# Patient Record
Sex: Male | Born: 1950 | Race: Black or African American | Hispanic: No | Marital: Single | Smoking: Former smoker
Health system: Southern US, Community
[De-identification: ages and names within clinical notes are randomized; demographics above are authoritative.]

## PROBLEM LIST (undated history)

## (undated) DIAGNOSIS — M8700 Idiopathic aseptic necrosis of unspecified bone: Secondary | ICD-10-CM

## (undated) DIAGNOSIS — D649 Anemia, unspecified: Secondary | ICD-10-CM

## (undated) DIAGNOSIS — Z720 Tobacco use: Secondary | ICD-10-CM

## (undated) DIAGNOSIS — I2510 Atherosclerotic heart disease of native coronary artery without angina pectoris: Secondary | ICD-10-CM

## (undated) DIAGNOSIS — E785 Hyperlipidemia, unspecified: Secondary | ICD-10-CM

## (undated) DIAGNOSIS — R413 Other amnesia: Secondary | ICD-10-CM

## (undated) DIAGNOSIS — J302 Other seasonal allergic rhinitis: Secondary | ICD-10-CM

## (undated) DIAGNOSIS — I219 Acute myocardial infarction, unspecified: Secondary | ICD-10-CM

## (undated) DIAGNOSIS — H409 Unspecified glaucoma: Secondary | ICD-10-CM

## (undated) DIAGNOSIS — H8110 Benign paroxysmal vertigo, unspecified ear: Secondary | ICD-10-CM

## (undated) DIAGNOSIS — K219 Gastro-esophageal reflux disease without esophagitis: Secondary | ICD-10-CM

## (undated) DIAGNOSIS — M1612 Unilateral primary osteoarthritis, left hip: Secondary | ICD-10-CM

## (undated) DIAGNOSIS — L237 Allergic contact dermatitis due to plants, except food: Secondary | ICD-10-CM

## (undated) DIAGNOSIS — Z8701 Personal history of pneumonia (recurrent): Secondary | ICD-10-CM

## (undated) DIAGNOSIS — I10 Essential (primary) hypertension: Secondary | ICD-10-CM

## (undated) DIAGNOSIS — J45909 Unspecified asthma, uncomplicated: Secondary | ICD-10-CM

## (undated) DIAGNOSIS — M810 Age-related osteoporosis without current pathological fracture: Secondary | ICD-10-CM

## (undated) HISTORY — PX: HAND NERVE REPAIR: SHX1728

## (undated) HISTORY — DX: Hyperlipidemia, unspecified: E78.5

## (undated) HISTORY — DX: Age-related osteoporosis without current pathological fracture: M81.0

## (undated) HISTORY — DX: Personal history of pneumonia (recurrent): Z87.01

## (undated) HISTORY — DX: Acute myocardial infarction, unspecified: I21.9

## (undated) HISTORY — DX: Anemia, unspecified: D64.9

## (undated) HISTORY — DX: Benign paroxysmal vertigo, unspecified ear: H81.10

## (undated) HISTORY — DX: Other amnesia: R41.3

## (undated) HISTORY — DX: Unspecified glaucoma: H40.9

## (undated) HISTORY — PX: CARDIAC CATHETERIZATION: SHX172

## (undated) HISTORY — DX: Idiopathic aseptic necrosis of unspecified bone: M87.00

## (undated) HISTORY — DX: Tobacco use: Z72.0

## (undated) HISTORY — DX: Essential (primary) hypertension: I10

## (undated) HISTORY — PX: TOTAL HIP ARTHROPLASTY: SHX124

## (undated) HISTORY — PX: JOINT REPLACEMENT: SHX530

---
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
## 1994-10-08 ENCOUNTER — Encounter (INDEPENDENT_AMBULATORY_CARE_PROVIDER_SITE_OTHER): Payer: Self-pay | Admitting: *Deleted

## 1994-10-08 LAB — CONVERTED CEMR LAB: CD4 T Cell Abs: 70

## 1997-08-18 ENCOUNTER — Encounter: Admission: RE | Admit: 1997-08-18 | Discharge: 1997-08-18 | Payer: Self-pay | Admitting: Infectious Diseases

## 1997-10-14 ENCOUNTER — Encounter: Admission: RE | Admit: 1997-10-14 | Discharge: 1997-10-14 | Payer: Self-pay | Admitting: Infectious Diseases

## 1997-10-16 ENCOUNTER — Encounter: Admission: RE | Admit: 1997-10-16 | Discharge: 1997-10-16 | Payer: Self-pay | Admitting: *Deleted

## 1997-11-26 ENCOUNTER — Encounter: Admission: RE | Admit: 1997-11-26 | Discharge: 1997-11-26 | Payer: Self-pay | Admitting: Infectious Diseases

## 1998-01-05 ENCOUNTER — Encounter: Admission: RE | Admit: 1998-01-05 | Discharge: 1998-01-05 | Payer: Self-pay | Admitting: Infectious Diseases

## 1998-03-11 ENCOUNTER — Encounter: Admission: RE | Admit: 1998-03-11 | Discharge: 1998-03-11 | Payer: Self-pay | Admitting: Infectious Diseases

## 1998-03-11 ENCOUNTER — Ambulatory Visit (HOSPITAL_COMMUNITY): Admission: RE | Admit: 1998-03-11 | Discharge: 1998-03-11 | Payer: Self-pay | Admitting: Infectious Diseases

## 1998-04-22 ENCOUNTER — Encounter: Admission: RE | Admit: 1998-04-22 | Discharge: 1998-04-22 | Payer: Self-pay | Admitting: Infectious Diseases

## 1998-06-24 ENCOUNTER — Ambulatory Visit (HOSPITAL_COMMUNITY): Admission: RE | Admit: 1998-06-24 | Discharge: 1998-06-24 | Payer: Self-pay | Admitting: Infectious Diseases

## 1998-08-05 ENCOUNTER — Encounter: Admission: RE | Admit: 1998-08-05 | Discharge: 1998-08-05 | Payer: Self-pay | Admitting: Infectious Diseases

## 1998-08-24 ENCOUNTER — Ambulatory Visit (HOSPITAL_COMMUNITY): Admission: RE | Admit: 1998-08-24 | Discharge: 1998-08-24 | Payer: Self-pay | Admitting: Infectious Diseases

## 1998-08-24 ENCOUNTER — Encounter: Admission: RE | Admit: 1998-08-24 | Discharge: 1998-08-24 | Payer: Self-pay | Admitting: Infectious Diseases

## 1998-09-09 ENCOUNTER — Encounter: Admission: RE | Admit: 1998-09-09 | Discharge: 1998-09-09 | Payer: Self-pay | Admitting: Infectious Diseases

## 1998-11-11 ENCOUNTER — Ambulatory Visit (HOSPITAL_COMMUNITY): Admission: RE | Admit: 1998-11-11 | Discharge: 1998-11-11 | Payer: Self-pay | Admitting: Infectious Diseases

## 1998-11-18 ENCOUNTER — Encounter: Admission: RE | Admit: 1998-11-18 | Discharge: 1998-11-18 | Payer: Self-pay | Admitting: Internal Medicine

## 1998-11-23 ENCOUNTER — Ambulatory Visit (HOSPITAL_COMMUNITY): Admission: RE | Admit: 1998-11-23 | Discharge: 1998-11-23 | Payer: Self-pay | Admitting: Infectious Diseases

## 1998-11-23 ENCOUNTER — Encounter: Payer: Self-pay | Admitting: Infectious Diseases

## 1998-11-26 ENCOUNTER — Encounter: Payer: Self-pay | Admitting: Infectious Diseases

## 1998-11-26 ENCOUNTER — Ambulatory Visit (HOSPITAL_COMMUNITY): Admission: RE | Admit: 1998-11-26 | Discharge: 1998-11-26 | Payer: Self-pay | Admitting: Infectious Diseases

## 1998-12-09 ENCOUNTER — Encounter: Admission: RE | Admit: 1998-12-09 | Discharge: 1998-12-09 | Payer: Self-pay | Admitting: Infectious Diseases

## 1999-03-31 ENCOUNTER — Encounter: Admission: RE | Admit: 1999-03-31 | Discharge: 1999-03-31 | Payer: Self-pay | Admitting: Infectious Diseases

## 1999-04-05 ENCOUNTER — Ambulatory Visit (HOSPITAL_COMMUNITY): Admission: RE | Admit: 1999-04-05 | Discharge: 1999-04-05 | Payer: Self-pay | Admitting: Infectious Diseases

## 1999-04-05 ENCOUNTER — Encounter: Admission: RE | Admit: 1999-04-05 | Discharge: 1999-04-05 | Payer: Self-pay | Admitting: Infectious Diseases

## 1999-04-12 ENCOUNTER — Encounter: Admission: RE | Admit: 1999-04-12 | Discharge: 1999-04-12 | Payer: Self-pay | Admitting: Infectious Diseases

## 1999-04-19 ENCOUNTER — Emergency Department (HOSPITAL_COMMUNITY): Admission: EM | Admit: 1999-04-19 | Discharge: 1999-04-19 | Payer: Self-pay | Admitting: Emergency Medicine

## 1999-04-20 ENCOUNTER — Encounter: Payer: Self-pay | Admitting: Emergency Medicine

## 1999-06-02 ENCOUNTER — Ambulatory Visit (HOSPITAL_COMMUNITY): Admission: RE | Admit: 1999-06-02 | Discharge: 1999-06-02 | Payer: Self-pay | Admitting: Infectious Diseases

## 1999-06-02 ENCOUNTER — Encounter: Admission: RE | Admit: 1999-06-02 | Discharge: 1999-06-02 | Payer: Self-pay | Admitting: Infectious Diseases

## 1999-06-16 ENCOUNTER — Encounter: Admission: RE | Admit: 1999-06-16 | Discharge: 1999-06-16 | Payer: Self-pay | Admitting: Infectious Diseases

## 1999-08-26 ENCOUNTER — Ambulatory Visit (HOSPITAL_COMMUNITY): Admission: RE | Admit: 1999-08-26 | Discharge: 1999-08-26 | Payer: Self-pay | Admitting: Infectious Diseases

## 1999-08-26 ENCOUNTER — Encounter: Admission: RE | Admit: 1999-08-26 | Discharge: 1999-08-26 | Payer: Self-pay | Admitting: Infectious Diseases

## 1999-09-08 ENCOUNTER — Encounter: Admission: RE | Admit: 1999-09-08 | Discharge: 1999-09-08 | Payer: Self-pay | Admitting: Infectious Diseases

## 1999-09-15 ENCOUNTER — Encounter: Admission: RE | Admit: 1999-09-15 | Discharge: 1999-09-15 | Payer: Self-pay | Admitting: Internal Medicine

## 1999-10-13 ENCOUNTER — Encounter: Admission: RE | Admit: 1999-10-13 | Discharge: 1999-10-13 | Payer: Self-pay | Admitting: Infectious Diseases

## 2000-01-05 ENCOUNTER — Ambulatory Visit (HOSPITAL_COMMUNITY): Admission: RE | Admit: 2000-01-05 | Discharge: 2000-01-05 | Payer: Self-pay | Admitting: Infectious Diseases

## 2000-01-05 ENCOUNTER — Encounter: Admission: RE | Admit: 2000-01-05 | Discharge: 2000-01-05 | Payer: Self-pay | Admitting: Infectious Diseases

## 2000-01-26 ENCOUNTER — Encounter: Admission: RE | Admit: 2000-01-26 | Discharge: 2000-01-26 | Payer: Self-pay | Admitting: Infectious Diseases

## 2000-05-04 ENCOUNTER — Encounter: Admission: RE | Admit: 2000-05-04 | Discharge: 2000-05-04 | Payer: Self-pay | Admitting: Infectious Diseases

## 2000-05-04 ENCOUNTER — Ambulatory Visit (HOSPITAL_COMMUNITY): Admission: RE | Admit: 2000-05-04 | Discharge: 2000-05-04 | Payer: Self-pay | Admitting: Infectious Diseases

## 2000-05-22 ENCOUNTER — Encounter: Admission: RE | Admit: 2000-05-22 | Discharge: 2000-05-22 | Payer: Self-pay | Admitting: Infectious Diseases

## 2000-08-09 ENCOUNTER — Encounter: Admission: RE | Admit: 2000-08-09 | Discharge: 2000-08-09 | Payer: Self-pay | Admitting: Infectious Diseases

## 2000-08-09 ENCOUNTER — Ambulatory Visit (HOSPITAL_COMMUNITY): Admission: RE | Admit: 2000-08-09 | Discharge: 2000-08-09 | Payer: Self-pay | Admitting: Infectious Diseases

## 2000-10-16 ENCOUNTER — Encounter: Admission: RE | Admit: 2000-10-16 | Discharge: 2000-10-16 | Payer: Self-pay | Admitting: Infectious Diseases

## 2001-01-23 ENCOUNTER — Ambulatory Visit (HOSPITAL_COMMUNITY): Admission: RE | Admit: 2001-01-23 | Discharge: 2001-01-23 | Payer: Self-pay | Admitting: Infectious Diseases

## 2001-01-23 ENCOUNTER — Encounter: Admission: RE | Admit: 2001-01-23 | Discharge: 2001-01-23 | Payer: Self-pay | Admitting: Infectious Diseases

## 2001-02-05 ENCOUNTER — Encounter: Admission: RE | Admit: 2001-02-05 | Discharge: 2001-02-05 | Payer: Self-pay | Admitting: Infectious Diseases

## 2001-03-17 ENCOUNTER — Emergency Department (HOSPITAL_COMMUNITY): Admission: EM | Admit: 2001-03-17 | Discharge: 2001-03-17 | Payer: Self-pay | Admitting: Emergency Medicine

## 2001-04-02 ENCOUNTER — Encounter: Admission: RE | Admit: 2001-04-02 | Discharge: 2001-04-02 | Payer: Self-pay | Admitting: Infectious Diseases

## 2001-08-06 ENCOUNTER — Encounter: Admission: RE | Admit: 2001-08-06 | Discharge: 2001-08-06 | Payer: Self-pay | Admitting: Infectious Diseases

## 2001-08-06 ENCOUNTER — Ambulatory Visit (HOSPITAL_COMMUNITY): Admission: RE | Admit: 2001-08-06 | Discharge: 2001-08-06 | Payer: Self-pay | Admitting: Infectious Diseases

## 2002-02-11 ENCOUNTER — Ambulatory Visit (HOSPITAL_COMMUNITY): Admission: RE | Admit: 2002-02-11 | Discharge: 2002-02-11 | Payer: Self-pay | Admitting: Infectious Diseases

## 2002-02-11 ENCOUNTER — Encounter: Admission: RE | Admit: 2002-02-11 | Discharge: 2002-02-11 | Payer: Self-pay | Admitting: Infectious Diseases

## 2002-04-09 ENCOUNTER — Encounter: Admission: RE | Admit: 2002-04-09 | Discharge: 2002-04-09 | Payer: Self-pay | Admitting: Infectious Diseases

## 2002-08-19 ENCOUNTER — Encounter (INDEPENDENT_AMBULATORY_CARE_PROVIDER_SITE_OTHER): Payer: Self-pay | Admitting: Infectious Diseases

## 2002-08-19 ENCOUNTER — Encounter: Admission: RE | Admit: 2002-08-19 | Discharge: 2002-08-19 | Payer: Self-pay | Admitting: Infectious Diseases

## 2002-11-13 ENCOUNTER — Encounter: Admission: RE | Admit: 2002-11-13 | Discharge: 2002-11-13 | Payer: Self-pay | Admitting: Infectious Diseases

## 2002-11-21 ENCOUNTER — Encounter: Payer: Self-pay | Admitting: Emergency Medicine

## 2002-11-21 ENCOUNTER — Emergency Department (HOSPITAL_COMMUNITY): Admission: EM | Admit: 2002-11-21 | Discharge: 2002-11-21 | Payer: Self-pay | Admitting: Emergency Medicine

## 2002-12-16 ENCOUNTER — Ambulatory Visit (HOSPITAL_COMMUNITY): Admission: RE | Admit: 2002-12-16 | Discharge: 2002-12-16 | Payer: Self-pay | Admitting: Infectious Diseases

## 2002-12-16 ENCOUNTER — Encounter (INDEPENDENT_AMBULATORY_CARE_PROVIDER_SITE_OTHER): Payer: Self-pay | Admitting: Infectious Diseases

## 2002-12-16 ENCOUNTER — Encounter: Admission: RE | Admit: 2002-12-16 | Discharge: 2002-12-16 | Payer: Self-pay | Admitting: Infectious Diseases

## 2003-01-27 ENCOUNTER — Encounter: Admission: RE | Admit: 2003-01-27 | Discharge: 2003-01-27 | Payer: Self-pay | Admitting: Infectious Diseases

## 2003-02-26 ENCOUNTER — Encounter (INDEPENDENT_AMBULATORY_CARE_PROVIDER_SITE_OTHER): Payer: Self-pay | Admitting: Infectious Diseases

## 2003-02-26 ENCOUNTER — Encounter: Admission: RE | Admit: 2003-02-26 | Discharge: 2003-02-26 | Payer: Self-pay | Admitting: Infectious Diseases

## 2003-02-26 ENCOUNTER — Ambulatory Visit (HOSPITAL_COMMUNITY): Admission: RE | Admit: 2003-02-26 | Discharge: 2003-02-26 | Payer: Self-pay | Admitting: Infectious Diseases

## 2003-03-17 ENCOUNTER — Encounter: Admission: RE | Admit: 2003-03-17 | Discharge: 2003-03-17 | Payer: Self-pay | Admitting: Infectious Diseases

## 2003-06-12 ENCOUNTER — Encounter: Admission: RE | Admit: 2003-06-12 | Discharge: 2003-06-12 | Payer: Self-pay | Admitting: Infectious Diseases

## 2003-07-08 ENCOUNTER — Encounter: Admission: RE | Admit: 2003-07-08 | Discharge: 2003-07-08 | Payer: Self-pay | Admitting: Infectious Diseases

## 2003-11-06 ENCOUNTER — Ambulatory Visit (HOSPITAL_COMMUNITY): Admission: RE | Admit: 2003-11-06 | Discharge: 2003-11-06 | Payer: Self-pay | Admitting: Infectious Diseases

## 2003-11-06 ENCOUNTER — Encounter: Admission: RE | Admit: 2003-11-06 | Discharge: 2003-11-06 | Payer: Self-pay | Admitting: Infectious Diseases

## 2003-11-20 ENCOUNTER — Encounter: Admission: RE | Admit: 2003-11-20 | Discharge: 2003-11-20 | Payer: Self-pay | Admitting: Infectious Diseases

## 2004-03-26 ENCOUNTER — Ambulatory Visit (HOSPITAL_COMMUNITY): Admission: RE | Admit: 2004-03-26 | Discharge: 2004-03-26 | Payer: Self-pay | Admitting: Infectious Diseases

## 2004-03-26 ENCOUNTER — Ambulatory Visit: Payer: Self-pay | Admitting: Infectious Diseases

## 2004-04-13 ENCOUNTER — Ambulatory Visit: Payer: Self-pay | Admitting: Infectious Diseases

## 2004-07-01 ENCOUNTER — Ambulatory Visit (HOSPITAL_COMMUNITY): Admission: RE | Admit: 2004-07-01 | Discharge: 2004-07-01 | Payer: Self-pay | Admitting: Infectious Diseases

## 2004-07-01 ENCOUNTER — Ambulatory Visit: Payer: Self-pay | Admitting: Infectious Diseases

## 2004-07-19 ENCOUNTER — Ambulatory Visit: Payer: Self-pay | Admitting: Infectious Diseases

## 2004-11-08 ENCOUNTER — Ambulatory Visit (HOSPITAL_COMMUNITY): Admission: RE | Admit: 2004-11-08 | Discharge: 2004-11-08 | Payer: Self-pay | Admitting: Infectious Diseases

## 2004-11-08 ENCOUNTER — Ambulatory Visit: Payer: Self-pay | Admitting: Infectious Diseases

## 2004-11-29 ENCOUNTER — Ambulatory Visit: Payer: Self-pay | Admitting: Infectious Diseases

## 2005-03-23 ENCOUNTER — Encounter (INDEPENDENT_AMBULATORY_CARE_PROVIDER_SITE_OTHER): Payer: Self-pay | Admitting: *Deleted

## 2005-03-23 ENCOUNTER — Ambulatory Visit: Payer: Self-pay | Admitting: Infectious Diseases

## 2005-03-23 ENCOUNTER — Ambulatory Visit (HOSPITAL_COMMUNITY): Admission: RE | Admit: 2005-03-23 | Discharge: 2005-03-23 | Payer: Self-pay | Admitting: Infectious Diseases

## 2005-03-23 LAB — CONVERTED CEMR LAB: HIV 1 RNA Quant: 49 copies/mL

## 2005-04-12 ENCOUNTER — Ambulatory Visit: Payer: Self-pay | Admitting: Infectious Diseases

## 2005-09-22 ENCOUNTER — Encounter (INDEPENDENT_AMBULATORY_CARE_PROVIDER_SITE_OTHER): Payer: Self-pay | Admitting: *Deleted

## 2005-09-22 ENCOUNTER — Ambulatory Visit: Payer: Self-pay | Admitting: Infectious Diseases

## 2005-09-22 ENCOUNTER — Encounter: Admission: RE | Admit: 2005-09-22 | Discharge: 2005-09-22 | Payer: Self-pay | Admitting: Infectious Diseases

## 2005-09-22 LAB — CONVERTED CEMR LAB
CD4 Count: 440 microliters
HIV 1 RNA Quant: 49 copies/mL

## 2005-10-10 ENCOUNTER — Ambulatory Visit: Payer: Self-pay | Admitting: Infectious Diseases

## 2006-03-23 ENCOUNTER — Encounter (INDEPENDENT_AMBULATORY_CARE_PROVIDER_SITE_OTHER): Payer: Self-pay | Admitting: *Deleted

## 2006-03-23 ENCOUNTER — Emergency Department (HOSPITAL_COMMUNITY): Admission: EM | Admit: 2006-03-23 | Discharge: 2006-03-23 | Payer: Self-pay | Admitting: Family Medicine

## 2006-03-23 ENCOUNTER — Encounter: Admission: RE | Admit: 2006-03-23 | Discharge: 2006-03-23 | Payer: Self-pay | Admitting: Infectious Diseases

## 2006-03-23 ENCOUNTER — Ambulatory Visit: Payer: Self-pay | Admitting: Infectious Diseases

## 2006-03-23 LAB — CONVERTED CEMR LAB
ALT: 13 units/L (ref 0–53)
AST: 15 units/L (ref 0–37)
Albumin: 4.3 g/dL (ref 3.5–5.2)
Alkaline Phosphatase: 124 units/L — ABNORMAL HIGH (ref 39–117)
BUN: 12 mg/dL (ref 6–23)
Basophils Absolute: 0.1 10*3/uL (ref 0.0–0.1)
Basophils Relative: 1 % (ref 0–1)
CD4 Count: 480 microliters
CO2: 27 meq/L (ref 19–32)
Calcium: 9.5 mg/dL (ref 8.4–10.5)
Chloride: 107 meq/L (ref 96–112)
Creatinine, Ser: 1.06 mg/dL (ref 0.40–1.50)
Eosinophils Relative: 4 % (ref 0–4)
Glucose, Bld: 89 mg/dL (ref 70–99)
HCT: 43 % (ref 41.0–49.0)
HIV 1 RNA Quant: 49 copies/mL
HIV 1 RNA Quant: <50 copies/mL (ref <50)
HIV-1 RNA Quant, Log: <1.7 (ref <1.70)
Hemoglobin: 14.6 g/dL (ref 13.9–16.8)
Lymphocytes Relative: 32 % (ref 15–43)
Lymphs Abs: 2.1 10*3/uL (ref 0.8–3.1)
MCHC: 34 g/dL (ref 33.1–35.4)
MCV: 94.5 fL (ref 78.8–100.0)
Monocytes Absolute: 0.4 10*3/uL (ref 0.2–0.7)
Monocytes Relative: 6 % (ref 3–11)
Neutro Abs: 3.8 10*3/uL (ref 1.8–6.8)
Neutrophils Relative %: 57 % (ref 47–77)
Platelets: 233 10*3/uL (ref 152–374)
Potassium: 4.2 meq/L (ref 3.5–5.3)
RBC: 4.55 M/uL (ref 4.20–5.50)
RDW: 12.7 % (ref 11.5–15.3)
Sodium: 141 meq/L (ref 135–145)
Total Bilirubin: 0.6 mg/dL (ref 0.3–1.2)
Total Protein: 7.5 g/dL (ref 6.0–8.3)
WBC: 6.6 10*3/uL (ref 3.7–10.0)

## 2006-03-25 ENCOUNTER — Emergency Department (HOSPITAL_COMMUNITY): Admission: EM | Admit: 2006-03-25 | Discharge: 2006-03-25 | Payer: Self-pay | Admitting: Emergency Medicine

## 2006-04-11 ENCOUNTER — Ambulatory Visit: Payer: Self-pay | Admitting: Infectious Diseases

## 2006-05-05 ENCOUNTER — Ambulatory Visit: Payer: Self-pay | Admitting: Internal Medicine

## 2006-05-08 ENCOUNTER — Encounter: Payer: Self-pay | Admitting: Infectious Diseases

## 2006-07-03 ENCOUNTER — Encounter (INDEPENDENT_AMBULATORY_CARE_PROVIDER_SITE_OTHER): Payer: Self-pay | Admitting: *Deleted

## 2006-07-03 LAB — CONVERTED CEMR LAB

## 2006-07-16 ENCOUNTER — Encounter (INDEPENDENT_AMBULATORY_CARE_PROVIDER_SITE_OTHER): Payer: Self-pay | Admitting: *Deleted

## 2006-08-07 ENCOUNTER — Telehealth (INDEPENDENT_AMBULATORY_CARE_PROVIDER_SITE_OTHER): Payer: Self-pay | Admitting: Infectious Diseases

## 2006-09-11 ENCOUNTER — Encounter (INDEPENDENT_AMBULATORY_CARE_PROVIDER_SITE_OTHER): Payer: Self-pay | Admitting: Infectious Diseases

## 2006-09-12 DIAGNOSIS — F410 Panic disorder [episodic paroxysmal anxiety] without agoraphobia: Secondary | ICD-10-CM

## 2006-09-12 DIAGNOSIS — M8700 Idiopathic aseptic necrosis of unspecified bone: Secondary | ICD-10-CM

## 2006-09-12 DIAGNOSIS — B20 Human immunodeficiency virus [HIV] disease: Secondary | ICD-10-CM | POA: Insufficient documentation

## 2006-09-12 DIAGNOSIS — I10 Essential (primary) hypertension: Secondary | ICD-10-CM | POA: Insufficient documentation

## 2006-09-29 ENCOUNTER — Encounter: Admission: RE | Admit: 2006-09-29 | Discharge: 2006-09-29 | Payer: Self-pay | Admitting: Infectious Diseases

## 2006-09-29 ENCOUNTER — Ambulatory Visit: Payer: Self-pay | Admitting: Infectious Diseases

## 2006-11-06 ENCOUNTER — Ambulatory Visit: Payer: Self-pay | Admitting: Infectious Diseases

## 2006-11-13 ENCOUNTER — Telehealth: Payer: Self-pay | Admitting: Internal Medicine

## 2006-11-14 ENCOUNTER — Ambulatory Visit: Payer: Self-pay | Admitting: Internal Medicine

## 2006-11-14 DIAGNOSIS — L0292 Furuncle, unspecified: Secondary | ICD-10-CM | POA: Insufficient documentation

## 2006-11-14 DIAGNOSIS — L0293 Carbuncle, unspecified: Secondary | ICD-10-CM

## 2007-01-16 ENCOUNTER — Inpatient Hospital Stay (HOSPITAL_COMMUNITY): Admission: RE | Admit: 2007-01-16 | Discharge: 2007-01-20 | Payer: Self-pay | Admitting: Orthopaedic Surgery

## 2007-02-14 ENCOUNTER — Encounter: Payer: Self-pay | Admitting: Infectious Disease

## 2007-03-14 ENCOUNTER — Encounter: Payer: Self-pay | Admitting: Infectious Disease

## 2007-04-04 ENCOUNTER — Ambulatory Visit: Payer: Self-pay | Admitting: Infectious Diseases

## 2007-04-04 ENCOUNTER — Encounter: Payer: Self-pay | Admitting: Infectious Diseases

## 2007-04-04 ENCOUNTER — Encounter: Admission: RE | Admit: 2007-04-04 | Discharge: 2007-04-04 | Payer: Self-pay | Admitting: Infectious Diseases

## 2007-04-04 ENCOUNTER — Ambulatory Visit: Payer: Self-pay | Admitting: Internal Medicine

## 2007-04-04 DIAGNOSIS — N390 Urinary tract infection, site not specified: Secondary | ICD-10-CM

## 2007-04-04 LAB — CONVERTED CEMR LAB
ALT: 19 units/L (ref 0–53)
AST: 17 units/L (ref 0–37)
Alkaline Phosphatase: 144 units/L — ABNORMAL HIGH (ref 39–117)
Basophils Absolute: 0 10*3/uL (ref 0.0–0.1)
Basophils Relative: 0 % (ref 0–1)
Bilirubin Urine: NEGATIVE
CO2: 24 meq/L (ref 19–32)
Creatinine, Ser: 0.91 mg/dL (ref 0.40–1.50)
Eosinophils Absolute: 0.1 10*3/uL — ABNORMAL LOW (ref 0.2–0.7)
Eosinophils Relative: 0 % (ref 0–5)
HCT: 45.7 % (ref 39.0–52.0)
HIV 1 RNA Quant: 62 copies/mL — ABNORMAL HIGH (ref <50)
Hemoglobin: 14.7 g/dL (ref 13.0–17.0)
Lymphocytes Relative: 13 % (ref 12–46)
MCHC: 32.2 g/dL (ref 30.0–36.0)
Monocytes Absolute: 0.8 10*3/uL (ref 0.1–1.0)
Nitrite: POSITIVE
Platelets: 210 10*3/uL (ref 150–400)
Protein, U semiquant: >=300
RDW: 13.3 % (ref 11.5–15.5)
Sodium: 138 meq/L (ref 135–145)
Specific Gravity, Urine: >=1.03
Total Bilirubin: 1 mg/dL (ref 0.3–1.2)
Total Protein: 7.8 g/dL (ref 6.0–8.3)
pH: 5.5

## 2007-04-25 ENCOUNTER — Ambulatory Visit: Payer: Self-pay | Admitting: Infectious Diseases

## 2007-05-08 ENCOUNTER — Encounter (INDEPENDENT_AMBULATORY_CARE_PROVIDER_SITE_OTHER): Payer: Self-pay | Admitting: *Deleted

## 2007-08-27 ENCOUNTER — Encounter (INDEPENDENT_AMBULATORY_CARE_PROVIDER_SITE_OTHER): Payer: Self-pay | Admitting: *Deleted

## 2007-10-06 ENCOUNTER — Emergency Department (HOSPITAL_COMMUNITY): Admission: EM | Admit: 2007-10-06 | Discharge: 2007-10-06 | Payer: Self-pay | Admitting: Emergency Medicine

## 2007-11-27 ENCOUNTER — Encounter: Admission: RE | Admit: 2007-11-27 | Discharge: 2007-11-27 | Payer: Self-pay | Admitting: Infectious Diseases

## 2007-11-27 ENCOUNTER — Ambulatory Visit: Payer: Self-pay | Admitting: Infectious Diseases

## 2007-11-27 LAB — CONVERTED CEMR LAB
AST: 17 units/L (ref 0–37)
BUN: 18 mg/dL (ref 6–23)
Basophils Absolute: 0.1 10*3/uL (ref 0.0–0.1)
Basophils Relative: 1 % (ref 0–1)
Calcium: 9.5 mg/dL (ref 8.4–10.5)
Chloride: 105 meq/L (ref 96–112)
Cholesterol: 232 mg/dL — ABNORMAL HIGH (ref 0–200)
Creatinine, Ser: 1.11 mg/dL (ref 0.40–1.50)
Eosinophils Relative: 5 % (ref 0–5)
HCT: 43.5 % (ref 39.0–52.0)
HDL: 45 mg/dL (ref >39)
HIV 1 RNA Quant: <50 copies/mL (ref <50)
Hemoglobin: 14.5 g/dL (ref 13.0–17.0)
MCHC: 33.3 g/dL (ref 30.0–36.0)
MCV: 97.1 fL (ref 78.0–100.0)
Monocytes Absolute: 0.3 10*3/uL (ref 0.1–1.0)
Monocytes Relative: 5 % (ref 3–12)
Neutro Abs: 3.9 10*3/uL (ref 1.7–7.7)
RBC: 4.48 M/uL (ref 4.22–5.81)
RDW: 13 % (ref 11.5–15.5)
Total Bilirubin: 0.4 mg/dL (ref 0.3–1.2)
Total CHOL/HDL Ratio: 5.2
VLDL: 33 mg/dL (ref 0–40)

## 2007-12-13 ENCOUNTER — Ambulatory Visit: Payer: Self-pay | Admitting: Infectious Diseases

## 2007-12-13 DIAGNOSIS — J329 Chronic sinusitis, unspecified: Secondary | ICD-10-CM

## 2008-05-26 ENCOUNTER — Ambulatory Visit: Payer: Self-pay | Admitting: Infectious Diseases

## 2008-05-26 LAB — CONVERTED CEMR LAB
ALT: 22 units/L (ref 0–53)
AST: 17 units/L (ref 0–37)
Alkaline Phosphatase: 121 units/L — ABNORMAL HIGH (ref 39–117)
Basophils Absolute: 0.1 10*3/uL (ref 0.0–0.1)
Basophils Relative: 1 % (ref 0–1)
Creatinine, Ser: 0.94 mg/dL (ref 0.40–1.50)
Eosinophils Absolute: 0.3 10*3/uL (ref 0.0–0.7)
Eosinophils Relative: 3 % (ref 0–5)
HCT: 44.4 % (ref 39.0–52.0)
HCV Ab: NEGATIVE
HIV 1 RNA Quant: <48 copies/mL (ref <48)
HIV-1 RNA Quant, Log: <1.68 (ref <1.68)
Hemoglobin: 15 g/dL (ref 13.0–17.0)
Hepatitis B Surface Ag: NEGATIVE
MCHC: 33.8 g/dL (ref 30.0–36.0)
Monocytes Absolute: 0.5 10*3/uL (ref 0.1–1.0)
Platelets: 240 10*3/uL (ref 150–400)
RDW: 13 % (ref 11.5–15.5)
Total Bilirubin: 0.6 mg/dL (ref 0.3–1.2)

## 2008-07-10 ENCOUNTER — Ambulatory Visit: Payer: Self-pay | Admitting: Infectious Diseases

## 2008-07-10 DIAGNOSIS — T8140XA Infection following a procedure, unspecified, initial encounter: Secondary | ICD-10-CM

## 2008-07-11 ENCOUNTER — Encounter: Payer: Self-pay | Admitting: Infectious Diseases

## 2008-09-12 ENCOUNTER — Encounter (INDEPENDENT_AMBULATORY_CARE_PROVIDER_SITE_OTHER): Payer: Self-pay | Admitting: *Deleted

## 2009-05-06 ENCOUNTER — Encounter: Payer: Self-pay | Admitting: Infectious Disease

## 2009-05-06 ENCOUNTER — Ambulatory Visit: Payer: Self-pay | Admitting: Infectious Diseases

## 2009-05-06 LAB — CONVERTED CEMR LAB
ALT: 14 units/L (ref 0–53)
Alkaline Phosphatase: 113 units/L (ref 39–117)
Basophils Relative: 1 % (ref 0–1)
CO2: 28 meq/L (ref 19–32)
HIV 1 RNA Quant: 71 copies/mL — ABNORMAL HIGH (ref <48)
MCHC: 33.8 g/dL (ref 30.0–36.0)
Monocytes Relative: 7 % (ref 3–12)
Neutro Abs: 3.5 10*3/uL (ref 1.7–7.7)
Neutrophils Relative %: 52 % (ref 43–77)
RBC: 4.42 M/uL (ref 4.22–5.81)
Sodium: 138 meq/L (ref 135–145)
Total Bilirubin: 0.3 mg/dL (ref 0.3–1.2)
Total Protein: 6.6 g/dL (ref 6.0–8.3)
WBC: 6.8 10*3/uL (ref 4.0–10.5)

## 2009-05-26 ENCOUNTER — Ambulatory Visit: Payer: Self-pay | Admitting: Infectious Diseases

## 2009-11-26 ENCOUNTER — Encounter: Payer: Self-pay | Admitting: Infectious Disease

## 2009-12-02 ENCOUNTER — Ambulatory Visit: Payer: Self-pay | Admitting: Infectious Disease

## 2009-12-02 LAB — CONVERTED CEMR LAB
ALT: 14 units/L (ref 0–53)
AST: 16 units/L (ref 0–37)
Basophils Absolute: 0 10*3/uL (ref 0.0–0.1)
Basophils Relative: 1 % (ref 0–1)
Creatinine, Ser: 0.96 mg/dL (ref 0.40–1.50)
Eosinophils Absolute: 0.2 10*3/uL (ref 0.0–0.7)
Eosinophils Relative: 3 % (ref 0–5)
HCT: 41.3 % (ref 39.0–52.0)
Hemoglobin: 14.3 g/dL (ref 13.0–17.0)
MCHC: 34.6 g/dL (ref 30.0–36.0)
Monocytes Absolute: 0.5 10*3/uL (ref 0.1–1.0)
RDW: 12.5 % (ref 11.5–15.5)
Total Bilirubin: 0.5 mg/dL (ref 0.3–1.2)

## 2009-12-21 ENCOUNTER — Ambulatory Visit: Payer: Self-pay | Admitting: Infectious Disease

## 2009-12-21 DIAGNOSIS — R413 Other amnesia: Secondary | ICD-10-CM

## 2009-12-21 LAB — CONVERTED CEMR LAB
GC Probe Amp, Urine: NEGATIVE
TSH: 0.788 microintl units/mL (ref 0.350–4.500)
Vitamin B-12: 375 pg/mL (ref 211–911)

## 2010-03-12 ENCOUNTER — Ambulatory Visit: Payer: Self-pay | Admitting: Infectious Disease

## 2010-03-12 LAB — CONVERTED CEMR LAB
Cholesterol: 222 mg/dL — ABNORMAL HIGH (ref 0–200)
VLDL: 36 mg/dL (ref 0–40)

## 2010-03-24 ENCOUNTER — Ambulatory Visit: Payer: Self-pay | Admitting: Infectious Disease

## 2010-03-24 DIAGNOSIS — B351 Tinea unguium: Secondary | ICD-10-CM

## 2010-03-24 DIAGNOSIS — F17200 Nicotine dependence, unspecified, uncomplicated: Secondary | ICD-10-CM | POA: Insufficient documentation

## 2010-03-24 LAB — CONVERTED CEMR LAB
AST: 19 units/L (ref 0–37)
Albumin: 4.6 g/dL (ref 3.5–5.2)
BUN: 12 mg/dL (ref 6–23)
Basophils Absolute: 0.1 10*3/uL (ref 0.0–0.1)
Basophils Relative: 1 % (ref 0–1)
Calcium: 9.6 mg/dL (ref 8.4–10.5)
Chloride: 106 meq/L (ref 96–112)
Eosinophils Absolute: 0.3 10*3/uL (ref 0.0–0.7)
HIV 1 RNA Quant: <20 copies/mL (ref <20)
HIV-1 RNA Quant, Log: <1.3 (ref <1.30)
MCHC: 33.7 g/dL (ref 30.0–36.0)
Monocytes Relative: 5 % (ref 3–12)
Neutro Abs: 3.4 10*3/uL (ref 1.7–7.7)
Neutrophils Relative %: 51 % (ref 43–77)
Platelets: 283 10*3/uL (ref 150–400)
Potassium: 3.8 meq/L (ref 3.5–5.3)
RBC: 4.68 M/uL (ref 4.22–5.81)
RDW: 13 % (ref 11.5–15.5)
Sodium: 140 meq/L (ref 135–145)
Total Protein: 7.9 g/dL (ref 6.0–8.3)

## 2010-04-06 ENCOUNTER — Encounter (INDEPENDENT_AMBULATORY_CARE_PROVIDER_SITE_OTHER): Payer: Self-pay | Admitting: *Deleted

## 2010-06-06 LAB — CONVERTED CEMR LAB
ALT: 22 units/L (ref 0–53)
Albumin: 4.6 g/dL (ref 3.5–5.2)
Basophils Absolute: 0.1 10*3/uL (ref 0.0–0.1)
CD4 Count: 580 microliters
CO2: 26 meq/L (ref 19–32)
Calcium: 9.7 mg/dL (ref 8.4–10.5)
Chloride: 105 meq/L (ref 96–112)
Cholesterol: 231 mg/dL — ABNORMAL HIGH (ref 0–200)
HIV 1 RNA Quant: <50 copies/mL (ref <50)
Hemoglobin: 15.4 g/dL (ref 13.0–17.0)
Leukocytes, UA: NEGATIVE
Lymphocytes Relative: 31 % (ref 12–46)
Monocytes Absolute: 0.3 10*3/uL (ref 0.2–0.7)
Neutro Abs: 4 10*3/uL (ref 1.7–7.7)
Neutrophils Relative %: 59 % (ref 43–77)
Nitrite: NEGATIVE
Platelets: 246 10*3/uL (ref 150–400)
Potassium: 3.9 meq/L (ref 3.5–5.3)
RDW: 13.9 % (ref 11.5–14.0)
Sodium: 141 meq/L (ref 135–145)
Total Protein: 8.2 g/dL (ref 6.0–8.3)
Urine Glucose: NEGATIVE mg/dL
pH: 5.5 (ref 5.0–8.0)

## 2010-06-10 NOTE — Assessment & Plan Note (Signed)
Summary: per tamika f/u [mkj]   Visit Type:  Follow-up Primary Wayne Santos:  Dr Renae Gloss  CC:  f/u, wants to discuss hiv medications, and c/o memory loss.  History of Present Illness: 60 yo AA nake with HIV that is superbly well controlled, OA, AVN who has noticed increasing problems with short term memory recently, noticing that he forgets things moret than usual> He was concerned about this forgetfullness. I discussed how HIV can have direct neurotoxicity and that main issue from intervention standpoint--should HIV be implicated would be to make sure that virus was suppressed. I discussed the theoretic benefits to haveing ARVs with high CNS pentrance (which his [place] has excellent CNS penetration). I discused other posibiltities including vitamin deficiency, hypothyroidism, depressionl. I offered to screen for these and also offered to refer him to Neurologist for formal cognitive testing but he refused apparently bearing some hurt feeling about having his AVN go unrecognized by R. Roxan Hockey in the past. I will at his preference refer this recommendation over to his PCP. I spent over 45 minutes with this pt incluidng greater than 50% of time spent with face to face cousneling.  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 Take 2 Tablets By Mouth Two Times A Day 2)  Truvada 200-300 Mg Tabs (Emtricitabine-Tenofovir) .... Take 1 Tablet By Mouth Once A Day 3)  Lisinopril 20 Mg  Tabs (Lisinopril) .... Once Daily 4)  Aspirin Adult  Low Strength 81 Mg  Tbec (Aspirin) .... Once Daily 5)  Nasonex 50 Mcg/act  Susp (Mometasone Furoate) .... 2 Sprays Once Daily  Current Medications (verified): 1)  Kaletra 200-50 Mg Tabs (Lopinavir-Ritonavir) .... Take 2 Tablets By Mouth Two Times A Day 2)  Truvada 200-300 Mg Tabs (Emtricitabine-Tenofovir) .... Take 1 Tablet By Mouth Once A Day 3)  Lisinopril 20 Mg  Tabs (Lisinopril) .... Once Daily 4)  Aspirin Adult Low Strength 81 Mg  Tbec (Aspirin) .... Once Daily 5)  Nasonex 50 Mcg/act  Susp (Mometasone Furoate) .... 2 Sprays Once Daily  Allergies (verified): No Known Drug Allergies   Preventive Screening-Counseling & Management  Alcohol-Tobacco     Alcohol drinks/day: <1     Alcohol type: liquor, wine occassionally     Smoking Status: current     Smoking Cessation Counseling: yes     Packs/Day: 1/2 ppd     Passive Smoke Exposure: yes  Caffeine-Diet-Exercise     Caffeine use/day: coffee,sodas     Does Patient Exercise: yes     Type of exercise: gym membership to [employer]     Exercise (avg: min/session): >60     Times/week: 3      Drug Use:  no.     Current Allergies (reviewed today): No known allergies  Family History: Reviewed history from 04/25/2007 and no changes required. Cancer of breast and colon and ovarian.   Heart disease in med.  Father died of MI age 39  Social History: Reviewed history from 04/25/2007 and no changes required. 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    Vital Signs:  Patient profile:   59 year old male Height:      68 inches (172.72 cm) Weight:      179.25 pounds (81.48 kg) BMI:     27.35 Temp:     97.8 degrees F (36.56 degrees C) oral Pulse rate:   77 / minute BP sitting:   136 / 81  (left arm)  Vitals Entered By: Starleen Arms CMA (December 21, 2009 9:43 AM) CC: f/u, wants to discuss hiv medications, c/o memory loss Is Patient Diabetic? No Pain Assessment Patient in pain? no      Nutritional Status BMI of 25 - 29 = overweight Nutritional Status Detail nl  Have you ever been in a relationship where you felt threatened, hurt or afraid?No   Does patient need assistance? Functional Status Self care Ambulation Normal   Physical Exam  General:  alert and well-nourished.   Head:  normocephalic and atraumatic.   Eyes:  vision grossly intact, pupils equal, and pupils round.   Ears:  no external deformities.   Nose:  no external deformity.   Mouth:  pharynx pink and moist and no erythema.   Neck:  supple and full ROM.   Lungs:  normal respiratory effort, no intercostal retractions, and no accessory muscle use.   Heart:  normal rate, regular rhythm, and no murmur.   Abdomen:  soft, non-tender, and normal bowel sounds.   Msk:  normal ROM and no joint tenderness.   Extremities:  no cce Neurologic:  alert & oriented X3 I did not do MMSE at this time. Itold him formal neurocognitive testing would be more sensitive Skin:  no rashes.  color normal.   Psych:  Oriented X3, slighty dysphoria when discussing his dx of OA and his memory probelms but otherwise pleasant        Medication Adherence: 12/21/2009   Adherence to medications reviewed with patient. Counseling to provide adequate adherence provided   Prevention For Positives: 12/21/2009   Safe sex practices discussed with patient. Condoms offered.   Education Materials Provided: 12/21/2009 Safe sex practices discussed with  patient. Condoms offered.                          Impression & Recommendations:  Problem # 1:  HIV DISEASE (ICD-042)  Superbly controlled. Informed he could go to once a day kaletra (4 tabs) with his truvada and that we could even consider cahnge to different boosted pi or possibly non pi regimen--depending on his ARV exposure and any Resistance testing that may hvae been done  Orders: Est. Patient Level V (82956)  Problem # 2:  MEMORY LOSS (ICD-780.93) Assessment: New Will screen tsh, b12, folate. RPR negative. WOUld be happyt orefer to Neurology. He is on ARV drugs with high CNS pentration scores (if this is of benefit as some HIV Neuro experts believe_ Orders: T-TSH (21308-65784) TLB-B12 + Folate Pnl ([id number]-L24/MWN) Est. Patient Level V (02725)  Problem # 3:  HYPERTENSION (ICD-401.9)  May need some other meds added but will defer to Dr. Renae Gloss His updated medication list for  this problem includes:    Lisinopril 20 Mg Tabs (Lisinopril) ..... Once daily  Orders: Est. Patient Level V (16109)  Problem # 4:  AVASCULAR NECROSIS (ICD-733.40)  he has had one THR. I would like to know about his calcium and vitamin D status, as well as his testosterone level (if it was checked). COuld consider putting him on bisphosphonate as well  Orders: Est. Patient Level V (60454)  Other Orders: T-GC Probe, urine (09811-91478) Hepatitis A Vaccine (Adult Dose) (29562) Admin 1st Vaccine (13086) Est. Patient Level IV (57846) Future Orders: T-CD4SP ([facility]) (CD4SP) ... 06/19/2010 T-HIV Viral Load [id number]) ... 06/19/2010 T-CBC w/Diff (24401-02725) ... 06/19/2010 T-Comprehensive Metabolic Panel [id number]) ... 06/19/2010 T-Lipid Profile [id number]) ... 06/19/2010 T-Lipid Profile [phone]) ... 06/19/2010  Patient Instructions: 1)  rtc inu 6 months 2)  Be sure to return for lab work one (1) week before your next appointment as scheduled.    Immunizations  Administered:  Hepatitis A Vaccine # 2:    Vaccine Type: HepA    Site: left deltoid    Mfr: GlaxoSmithKline    Dose: 1.0 ml    Route: IM    Given by: Starleen Arms CMA    Exp. Date: 04/07/2012    Lot #: [id number]    VIS given: 07/27/04 version given December 21, 2009.

## 2010-06-10 NOTE — Miscellaneous (Signed)
  Clinical Lists Changes  Observations: Added new observation of YEARAIDSPOS: 2003  (04/06/2010 9:40)

## 2010-06-10 NOTE — Assessment & Plan Note (Signed)
Summary: Flu vaccine /dde  Prior Medications: KALETRA 200-50 MG TABS (LOPINAVIR-RITONAVIR) Take 2 tablets by mouth two times a day TRUVADA 200-300 MG TABS (EMTRICITABINE-TENOFOVIR) Take 1 tablet by mouth once a day LISINOPRIL 20 MG  TABS (LISINOPRIL) once daily ASPIRIN ADULT LOW STRENGTH 81 MG  TBEC (ASPIRIN) once daily NASONEX 50 MCG/ACT  SUSP (MOMETASONE FUROATE) 2 sprays once daily Current Allergies: No known allergies  Immunizations Administered:  Influenza Vaccine # 1:    Vaccine Type: Fluvax MCR    Site: left deltoid    Mfr: Novartis    Dose: 0.5 ml    Route: IM    Given by: Wendall Mola CMA ( AAMA)    Exp. Date: 08/08/2010    Lot #: [id number]    VIS given: 12/01/09 version given March 12, 2010.  Flu Vaccine Consent Questions:    Do you have a history of severe allergic reactions to this vaccine? no    Any prior history of allergic reactions to egg and/or gelatin? no    Do you have a sensitivity to the preservative Thimersol? no    Do you have a past history of Guillan-Barre Syndrome? no    Do you currently have an acute febrile illness? no    Have you ever had a severe reaction to latex? no    Vaccine information given and explained to patient? yes  Orders Added: 1)  Influenza Vaccine MCR [00025]

## 2010-06-10 NOTE — Assessment & Plan Note (Signed)
Summary: fukam   Visit Type:  Follow-up Primary Provider:  Dr Renae Gloss  CC:  follow-up visit.  History of Present Illness: 60 yo with well controlled HIV.   The patient comes in today for HIV follow-up.  Last seen in March 2010 and has been doing quite well.    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 Had R THR 9/908 and did well.  Followed up with Dr Yisroel Ramming last summer and is getting occas percocet from his office.  May eventually need a L THR  Follows with  Dr Kellie Shropshire for HTN  Since the last visit the patient notes adherent with medication.    Preventive Screening-Counseling & Management  Alcohol-Tobacco     Alcohol drinks/day: <1     Alcohol type: liquor, wine occassionally     Smoking Status: current     Smoking Cessation Counseling: yes     Packs/Day: 1/2 ppd     Passive Smoke Exposure: yes  Caffeine-Diet-Exercise     Caffeine use/day: coffee,sodas     Does Patient Exercise: yes     Type of exercise: gym membership to [employer]     Exercise (avg: min/session): >60     Times/week: 3  Safety-Violence-Falls     Seat Belt Use: yes   Updated Prior Medication List: KALETRA 200-50 MG TABS (LOPINAVIR-RITONAVIR) Take 2 tablets by mouth two times a day TRUVADA 200-300 MG TABS (EMTRICITABINE-TENOFOVIR) Take 1 tablet by mouth once a day LISINOPRIL 20 MG  TABS (LISINOPRIL) once daily ASPIRIN ADULT LOW STRENGTH 81 MG  TBEC (ASPIRIN) once daily NASONEX 50 MCG/ACT  SUSP (MOMETASONE FUROATE) 2 sprays once daily  Current Allergies (reviewed today): No known allergies  Past History:  Past Medical History: Last updated: 12/13/2007 HIV disease - dxed 1996 - Thrush at dx PCP at dx  Hypertension Probable panic attacks 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 Post nasal drip  Past Surgical History: Last updated: 04/25/2007 01/16/07 R THR for Avascular necrosis  Family History: Last updated:  04/25/2007 Cancer of breast and colon and ovarian.   Heart disease in med.  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 Single  Risk Factors: Alcohol Use: <1 (05/26/2009) Caffeine Use: coffee,sodas (05/26/2009) Exercise: yes (05/26/2009)  Risk Factors: Smoking Status: current (05/26/2009) Packs/Day: 1/2 ppd (05/26/2009) Passive Smoke Exposure: yes (05/26/2009)  Review of Systems       11 systems reviewed and negative except per HPI   Vital Signs:  Patient profile:   60 year old male Height:      68 inches (172.72 cm) Weight:      175.0 pounds (79.55 kg) BMI:     26.70 Temp:     97.7 degrees F (36.50 degrees C) oral Pulse rate:   77 / minute BP sitting:   124 / 79  (left arm)  Vitals Entered By: Baxter Hire) (May 26, 2009 10:17 AM) CC: follow-up visit Is Patient Diabetic? No Pain Assessment Patient in pain? no      Nutritional Status BMI of 25 - 29 = overweight Nutritional Status Detail appetite is good per patient  Does patient need assistance? Functional Status Self care Ambulation Normal   Physical Exam  General:  alert and well-nourished.   Head:  normocephalic and atraumatic.   Mouth:  good dentition and no gingival abnormalities.   Neck:  supple and full ROM.   Lungs:  normal respiratory effort, no intercostal retractions, and no accessory muscle use.   Heart:  normal rate, regular rhythm, and no murmur.   Abdomen:  soft, non-tender, and normal bowel sounds.   Msk:  normal ROM and no joint tenderness.   Extremities:  no cce Neurologic:  alert & oriented X3 and cranial nerves II-XII intact.   Skin:  no rashes.   Cervical Nodes:  no anterior cervical adenopathy and no posterior cervical adenopathy.   Psych:  Oriented X3, memory intact for recent and remote, and normally interactive.          Medication Adherence: 05/26/2009   Adherence to  medications reviewed with patient. Counseling to provide adequate adherence provided   Prevention For Positives: 05/26/2009   Safe sex practices discussed with patient. Condoms offered.                             Impression & Recommendations:  Problem # 1:  HIV DISEASE (ICD-042) Doign well. Cont current meds.  Follow up in 9 months since he is doing so well and he also follows with Dr Renae Gloss for his BP Discussed HIV prevention, disclosure of HIV status to partners, and use of condoms for all genital contact with the patient. Discussed med adherence  Diagnostics Reviewed:  HIV: CDC-defined AIDS (12/13/2007)   CD4: 620 (05/07/2009)   WBC: 6.8 (05/06/2009)   Hgb: 14.2 (05/06/2009)   HCT: 42.0 (05/06/2009)   Platelets: 289 (05/06/2009) HIV-1 RNA: 71 (05/06/2009)   HBSAg: NEG (05/26/2008)  Problem # 2:  HYPERTENSION (ICD-401.9) Follows with Dr Renae Gloss His updated medication list for this problem includes:    Lisinopril 20 Mg Tabs (Lisinopril) ..... Once daily  BP today: 124/79 Prior BP: 150/93 (07/10/2008)  Labs Reviewed: K+: 4.0 (05/06/2009) Creat: : 1.03 (05/06/2009)   Chol: 232 (11/27/2007)   HDL: 45 (11/27/2007)   LDL: 154 (11/27/2007)   TG: 166 (11/27/2007)  Problem # 3:  AVASCULAR NECROSIS (ICD-733.40) Follows with Dr Yisroel Ramming who gives him occas percocet  Problem # 4:  PREVENTIVE HEALTH CARE (ICD-V70.0) No prior colonoscopy.  Dr Renae Gloss has been trying to get him scheduled but he does not have the funding right now.  Other Orders: Est. Patient Level IV (40981) Influenza Vaccine MCR (19147) Hepatitis B Vaccine >63yrs (82956) Admin 1st Vaccine (21308) Future Orders: T-CD4SP ([facility]) (CD4SP) ... 02/20/2010 T-HIV Viral Load [id number]) ... 02/20/2010 T-CBC w/Diff ([id number]) ... 02/20/2010 T-Comprehensive Metabolic Panel [phone]) ... 02/20/2010 T-RPR (Syphilis) [phone]) ... 02/20/2010  Patient Instructions: 1)  Please schedule a follow-up  appointment in 9 months. 2)  Be sure to return for lab work one (1) week before your next appointment as scheduled.    Immunizations Administered:  Influenza Vaccine # 1:    Vaccine Type: Fluvax MCR    Site: right deltoid    Mfr: Novartis    Dose: 0.5 ml    Route: IM    Given by: Kathi Simpers CMA(AAMA)    Exp. Date: 08/07/2009    Lot #: [id number]    VIS given: 11/30/06 version given May 26, 2009.  Hepatitis B Vaccine # 2:    Vaccine Type: HepB Adult    Site: left deltoid    Mfr: Merck    Dose: 1.0 ml    Route: IM    Given by: Kathi Simpers CMA(AAMA)    Exp. Date: 02/14/2011    Lot #: [id number]  VIS given: 11/23/05 version given May 26, 2009.  Flu Vaccine Consent Questions:    Do you have a history of severe allergic reactions to this vaccine? no    Any prior history of allergic reactions to egg and/or gelatin? no    Do you have a sensitivity to the preservative Thimersol? no    Do you have a past history of Guillan-Barre Syndrome? no    Do you currently have an acute febrile illness? no    Have you ever had a severe reaction to latex? no    Vaccine information given and explained to patient? yes Process Orders Check Orders Results:     [facility]: Check successful Tests Sent for requisitioning (May 26, 2009 12:22 PM):     02/20/2010: [facility] -- T-HIV Viral Load [phone] (signed)     02/20/2010: [facility] -- T-CBC w/Diff [[id number]] (signed)     02/20/2010: [facility] -- T-Comprehensive Metabolic Panel [[id number]] (signed)     02/20/2010: [facility] -- T-RPR (Syphilis) [phone] (signed)

## 2010-06-10 NOTE — Miscellaneous (Signed)
Summary: Orders Update - LAB  Clinical Lists Changes  Problems: Added new problem of ENCOUNTER FOR LONG-TERM USE OF OTHER MEDICATIONS (ICD-V58.69) Orders: Added new Test order of T-Lipid Profile [id number]) - Signed     Process Orders Check Orders Results:     [facility]: Check successful Tests Sent for requisitioning (November 26, 2009 5:03 PM):     12/03/2009: [facility] -- T-Lipid Profile [phone] (signed)

## 2010-06-10 NOTE — Assessment & Plan Note (Signed)
Summary: f/u appt /dde   Visit Type:  Follow-up Primary Provider:  Dr Renae Gloss  CC:  f/u, nail fungus, sinus problem, and wants to stop smoking.  History of Present Illness: 60 yo AA  with HIV that is superbly well controlled, OA, AVN who has noticed increasing problems with short term memory recently, noticing that he forgets things moret than usual. his viral load has been undetectable and cd4 count healthy. I did formal mmse today and he scored 28/30 to 30/30. He missed 2 items on serial 7s but no items on spelling world backwards.   We discussed his ARV regimen and he agreed to change to once daily norvir, prezista and truvada to lower the norvir dose to help improve his cholesterol. We reviewed how to take these medications.  He has had problems with onychomycosis despite application of topical antifungal. We discussed options for lamisil systemic. counselled him not to drink while using this  I also discussed his AVN and rx him fosamax and calcium.   He will work on smoking cessation int he new year.   I spent over 45 minutes with this pt incluidng greater than 50% of time spent with face to face cousneling.  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 Take 2 Tablets By Mouth Two Times A Day 2)  Truvada 200-300 Mg Tabs (Emtricitabine-Tenofovir) .... Take 1 Tablet By Mouth Once A Day 3)  Lisinopril 20 Mg  Tabs (Lisinopril) .... Once Daily 4)  Aspirin Adult Low Strength 81 Mg   Tbec (Aspirin) .... Once Daily 5)  Nasonex 50 Mcg/act  Susp (Mometasone Furoate) .... 2 Sprays Once Daily  Current Medications (verified): 1)  Truvada 200-300 Mg Tabs (Emtricitabine-Tenofovir) .... Take 1 Tablet By Mouth Once A Day 2)  Lisinopril 20 Mg  Tabs (Lisinopril) .... Once Daily 3)  Aspirin Adult Low Strength 81 Mg  Tbec (Aspirin) .... Once Daily 4)  Nasonex 50 Mcg/act  Susp (Mometasone Furoate) .... 2 Sprays Once Daily 5)  Terbinafine Hcl 250 Mg Tabs (Terbinafine Hcl) .... Take 1 Tablet By Mouth Once A Day For 3 Months. Avoid Alchohol 6)  Fosamax 70 Mg Tabs (Alendronate Sodium) .... Once Tablet Once Weekly 7)  Oyst-Cal 500 Mg Tabs (Oyster Shell) .... Take Two Tablets Once Daily 8)  Prezista 400 Mg Tabs (Darunavir Ethanolate) .... Take Two Tablets Once Dailiy With Norvir and Truvada 9)  Norvir 100 Mg Tabs (Ritonavir) .... Take 1 Tablet By Mouth Once A Day  Allergies (verified): No Known Drug Allergies   Preventive Screening-Counseling & Management  Alcohol-Tobacco     Alcohol drinks/day: <1     Alcohol type: liquor, wine occassionally     Smoking Status: current     Smoking Cessation Counseling: yes     Packs/Day: 1/2 ppd     Passive Smoke Exposure: yes   Current Allergies (reviewed today): No known allergies  Family History: Reviewed history from 04/25/2007 and no changes required. Cancer of breast and colon and ovarian.   Heart  disease in med.  Father died of MI age 32  Social History: Reviewed history from 04/25/2007 and no changes required. Occupation: works in Aeronautical engineer and driving van Current ZOXWRU0/4- 1pm Single  Review of Systems  The patient denies anorexia, fever, weight loss, weight gain, vision loss, decreased hearing, hoarseness, chest pain, syncope, dyspnea on exertion, peripheral edema, prolonged cough, headaches, hemoptysis, abdominal pain, melena, hematochezia, severe indigestion/heartburn, hematuria, incontinence, genital sores, muscle  weakness, suspicious skin lesions, transient blindness, difficulty walking, depression, unusual weight change, abnormal bleeding, and enlarged lymph nodes.    Vital Signs:  Patient profile:   60 year old male Height:      68 inches (172.72 cm) Weight:      177 pounds (80.45 kg) BMI:     27.01 Temp:     97.9 degrees F (36.61 degrees C) oral Pulse rate:   74 / minute BP sitting:   144 / 84  (left arm)  Vitals Entered By: Starleen Arms CMA (March 24, 2010 9:25 AM) CC: f/u, nail fungus, sinus problem, wants to stop smoking Is Patient Diabetic? No Pain Assessment Patient in pain? no      Nutritional Status BMI of 25 - 29 = overweight  Does patient need assistance? Functional Status Self care Ambulation Normal   Physical Exam  General:  alert and well-nourished.   Head:  normocephalic and atraumatic.   Eyes:  vision grossly intact, pupils equal, and pupils round.   Ears:  no external deformities.   Nose:  no external deformity.   Mouth:  pharynx pink and moist and no erythema.   Neck:  supple and full ROM.   Lungs:  normal respiratory effort, no intercostal retractions, and no accessory muscle use.   Heart:  normal rate, regular rhythm, and no murmur.   Abdomen:  soft, non-tender, and normal bowel sounds.   Msk:  normal ROM and no joint tenderness.   Neurologic:  alert & oriented X3 MMSE 29 to 30/30 Skin:  no rashes.  color normal.   Psych:  Oriented X3, snormally interactive and good eye contact.      Impression & Recommendations:  Problem # 1:  HIV DISEASE (ICD-042)  Excellent control,  but will change him ot prezista, norvir and truvada to lower his tg and cholesterol His updated medication list for this problem includes:    Terbinafine Hcl 250 Mg Tabs (Terbinafine hcl) .Marland Kitchen... Take 1 tablet by mouth once a day for 3 months. 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 Patient Level V (54098)  Problem # 2:  MEMORY LOSS (ICD-780.93)  can refer him for Neurocog testing if he so wishes.  Orders: Est. Patient Level V (11914)  Problem # 3:  AVASCULAR NECROSIS (ICD-733.40)  adding fosamax and calcium. check vitamin d level, testosterone was normal His updated medication list for this problem includes:    Fosamax 70 Mg Tabs (Alendronate sodium) ..... Once tablet once weekly  Orders: T-Vitamin D 25-Hydroxy & 1,25 Dihydroxy (8147) Est. Patient Level V (78295)  Problem # 4:  HYPERTENSION (ICD-401.9) BP up today. Likely would benefit from doubling his ACEI His updated medication list for this problem includes:    Lisinopril 20 Mg Tabs (Lisinopril) ..... Once daily  Orders: Est. Patient Level V (  04540)  BP today: 144/84 Prior BP: 136/81 (12/21/2009)  Labs Reviewed: K+: 3.9 (12/02/2009) Creat: : 0.96 (12/02/2009)   Chol: 222 (03/12/2010)   HDL: 39 (03/12/2010)   LDL: 147 (03/12/2010)   TG: 182 (03/12/2010)  Problem # 5:  ONYCHOMYCOSIS, TOENAILS (ICD-110.1)  His updated medication list for this problem includes:    Terbinafine Hcl 250 Mg Tabs (Terbinafine hcl) .Marland Kitchen... Take 1 tablet by mouth once a day for 3 months. avoid alchohol  Orders: Est. Patient Level V (98119)  Problem # 6:  SMOKER (ICD-305.1)  will try to quit in new year  Orders: Est. Patient Level V (14782)  Medications Added to Medication List This Visit: 1)  Terbinafine Hcl 250 Mg Tabs (Terbinafine hcl) .... Take 1 tablet by mouth once a day for 3 months. avoid alchohol 2)  Fosamax 70 Mg Tabs (Alendronate sodium) .... Once tablet once weekly 3)  Oyst-cal 500 Mg Tabs (Oyster shell) .... Take two tablets once daily 4)  Prezista 400 Mg Tabs (Darunavir ethanolate) .... Take two tablets once dailiy with norvir and truvada 5)  Norvir 100 Mg  Tabs (Ritonavir) .... Take 1 tablet by mouth once a day  Other Orders: T-CD4SP ([facility]) (CD4SP) T-HIV Viral Load [phone]) T-Comprehensive Metabolic Panel [phone]) T-CBC w/Diff [phone]) Hepatitis B Vaccine >83yrs [phone]) Admin 1st Vaccine (66440) Future Orders: T-CD4SP ([facility]) (CD4SP) ... 06/22/2010 T-HIV Viral Load [phone]) ... 06/22/2010 T-CBC w/Diff ([id number]) ... 06/22/2010 T-Comprehensive Metabolic Panel [phone]) ... 06/22/2010 T-HIV Viral Load [id number]) ... 06/22/2010 T-CD4SP ([facility]) (CD4SP) ... 06/22/2010 T-CBC w/Diff ([id number]) ... 06/22/2010 T-Comprehensive Metabolic Panel [phone]) ... 06/22/2010 T-RPR (Syphilis) [phone]) ... 06/22/2010 T-Lipid Profile [phone]) ... 06/22/2010  Patient Instructions: 1)  We willl check blood work today and then rtc in 4 months  Prescriptions: NORVIR 100 MG TABS (RITONAVIR) Take 1 tablet by mouth once a day  #30 x 11   Entered and Authorized by:   Acey Lav MD   Signed by:   Paulette Blanch Dam MD on 03/24/2010   Method used:   Electronically to        [facility] (mail-order)       [address]       [place], [place]  [address]       Ph: [phone]       Fax: [phone]   RxID:   [id number] PREZISTA 400 MG TABS (DARUNAVIR ETHANOLATE) take two tablets once dailiy with norvir and truvada  #60 x 11   Entered and Authorized by:   Acey Lav MD   Signed by:   Paulette Blanch Dam MD on 03/24/2010   Method used:   Electronically to        [facility] (mail-order)       [address]       [place], [place]  [address]       Ph: [phone]       Fax: [phone]   RxID:   [id number] OYST-CAL 500 MG TABS (OYSTER SHELL) take two tablets once daily  #60 x 11   Entered and Authorized by:   Acey Lav MD   Signed by:   Paulette Blanch Dam MD on 03/24/2010   Method used:   Electronically to        [facility]  (mail-order)       [address]       [place], [place]  [address]       Ph: [phone]  Fax: [phone]   RxID:   [id number] FOSAMAX 70 MG TABS (ALENDRONATE SODIUM) once tablet once weekly  #4 x 11   Entered and Authorized by:   Acey Lav MD   Signed by:   Paulette Blanch Dam MD on 03/24/2010   Method used:   Electronically to        [facility] (mail-order)       [address]       [place], [place]  [address]       Ph: [phone]       Fax: [phone]   RxID:   [id number] TERBINAFINE HCL 250 MG TABS (TERBINAFINE HCL) Take 1 tablet by mouth once a day for 3 months. avoid alchohol  #30 x 3   Entered and Authorized by:   Acey Lav MD   Signed by:   Paulette Blanch Dam MD on 03/24/2010   Method used:   Electronically to        [facility] (mail-order)       [address]       [place], [place]  [address]       Ph: [phone]       Fax: [phone]   RxID:   [id number]    Immunizations Administered:  Hepatitis B Vaccine # 3:    Vaccine Type: HepB Adult    Site: left deltoid    Mfr: Merck    Dose: 0.5 ml    Route: IM    Given by: Starleen Arms CMA    Exp. Date: 04/26/2012    Lot #: [id number]    VIS given: 11/23/05 version given March 24, 2010.

## 2010-07-21 ENCOUNTER — Telehealth (INDEPENDENT_AMBULATORY_CARE_PROVIDER_SITE_OTHER): Payer: Self-pay | Admitting: *Deleted

## 2010-07-21 ENCOUNTER — Encounter (INDEPENDENT_AMBULATORY_CARE_PROVIDER_SITE_OTHER): Payer: Self-pay | Admitting: *Deleted

## 2010-07-21 LAB — T-HELPER CELL (CD4) - (RCID CLINIC ONLY)
CD4 % Helper T Cell: 29 % — ABNORMAL LOW (ref 33–55)
CD4 T Cell Abs: 680 uL (ref 400–2700)

## 2010-07-27 NOTE — Progress Notes (Signed)
  Phone Note Other Incoming   Summary of Call: ADAP approved.  RW docs submitted to [facility] this date.    

## 2010-07-27 NOTE — Miscellaneous (Signed)
Summary: adap approved til 02/06/11  Clinical Lists Changes  Observations: Added new observation of AIDSDAP: Yes 2012 (07/21/2010 10:10) Added new observation of PCTFPL: 242.84  (07/21/2010 10:10) Added new observation of HOUSEINCOME: 16109  (07/21/2010 10:10) Added new observation of FINASSESSDT: 06/17/2010  (07/21/2010 10:10)

## 2010-07-31 ENCOUNTER — Other Ambulatory Visit: Payer: Self-pay | Admitting: Infectious Disease

## 2010-07-31 DIAGNOSIS — B351 Tinea unguium: Secondary | ICD-10-CM

## 2010-08-02 ENCOUNTER — Other Ambulatory Visit: Payer: Self-pay | Admitting: *Deleted

## 2010-08-02 NOTE — Telephone Encounter (Signed)
Pharmacy sent refill request for lamisil. Do you want him to continue on this?

## 2010-08-03 ENCOUNTER — Telehealth: Payer: Self-pay | Admitting: Infectious Disease

## 2010-08-03 NOTE — Telephone Encounter (Signed)
That is fine 

## 2010-08-04 ENCOUNTER — Other Ambulatory Visit: Payer: Medicare Other

## 2010-08-04 DIAGNOSIS — B20 Human immunodeficiency virus [HIV] disease: Secondary | ICD-10-CM

## 2010-08-04 LAB — CBC WITH DIFFERENTIAL/PLATELET
HCT: 42.7 % (ref 39.0–52.0)
Hemoglobin: 14.8 g/dL (ref 13.0–17.0)
Lymphs Abs: 1.9 10*3/uL (ref 0.7–4.0)
Monocytes Absolute: 0.3 10*3/uL (ref 0.1–1.0)
Monocytes Relative: 5 % (ref 3–12)
Neutro Abs: 3.7 10*3/uL (ref 1.7–7.7)
Neutrophils Relative %: 60 % (ref 43–77)
RBC: 4.61 MIL/uL (ref 4.22–5.81)

## 2010-08-04 LAB — LIPID PANEL
Total CHOL/HDL Ratio: 5.4 Ratio
VLDL: 27 mg/dL (ref 0–40)

## 2010-08-04 LAB — RPR

## 2010-08-05 LAB — COMPLETE METABOLIC PANEL WITH GFR
Albumin: 4.5 g/dL (ref 3.5–5.2)
BUN: 11 mg/dL (ref 6–23)
CO2: 23 mEq/L (ref 19–32)
Calcium: 9.4 mg/dL (ref 8.4–10.5)
Chloride: 108 mEq/L (ref 96–112)
GFR, Est African American: >60 mL/min (ref >60)
GFR, Est Non African American: >60 mL/min (ref >60)
Glucose, Bld: 102 mg/dL — ABNORMAL HIGH (ref 70–99)
Potassium: 3.6 mEq/L (ref 3.5–5.3)
Sodium: 140 mEq/L (ref 135–145)
Total Protein: 7.6 g/dL (ref 6.0–8.3)

## 2010-08-05 LAB — T-HELPER CELL (CD4) - (RCID CLINIC ONLY)
CD4 % Helper T Cell: 29 % — ABNORMAL LOW (ref 33–55)
CD4 T Cell Abs: 520 uL (ref 400–2700)

## 2010-08-06 LAB — HIV-1 RNA QUANT-NO REFLEX-BLD
HIV 1 RNA Quant: <20 copies/mL (ref <20)
HIV-1 RNA Quant, Log: <1.3 {Log} (ref <1.30)

## 2010-08-18 ENCOUNTER — Encounter: Payer: Self-pay | Admitting: Infectious Disease

## 2010-08-18 ENCOUNTER — Ambulatory Visit (INDEPENDENT_AMBULATORY_CARE_PROVIDER_SITE_OTHER): Payer: Medicare Other | Admitting: Infectious Disease

## 2010-08-18 DIAGNOSIS — M8700 Idiopathic aseptic necrosis of unspecified bone: Secondary | ICD-10-CM

## 2010-08-18 DIAGNOSIS — Z21 Asymptomatic human immunodeficiency virus [HIV] infection status: Secondary | ICD-10-CM

## 2010-08-18 DIAGNOSIS — B20 Human immunodeficiency virus [HIV] disease: Secondary | ICD-10-CM

## 2010-08-18 DIAGNOSIS — M810 Age-related osteoporosis without current pathological fracture: Secondary | ICD-10-CM

## 2010-08-18 DIAGNOSIS — I10 Essential (primary) hypertension: Secondary | ICD-10-CM

## 2010-08-18 DIAGNOSIS — R413 Other amnesia: Secondary | ICD-10-CM

## 2010-08-18 DIAGNOSIS — E785 Hyperlipidemia, unspecified: Secondary | ICD-10-CM

## 2010-08-18 NOTE — Assessment & Plan Note (Signed)
This seems quite minimal.

## 2010-08-18 NOTE — Assessment & Plan Note (Signed)
I encouraged him him to take Fosamax and calcium. We'll check vitamin D levels at next visit

## 2010-08-18 NOTE — Progress Notes (Signed)
  Subjective:    Patient ID: Wayne Santos, male    DOB: 1950-07-17, 60 y.o.   MRN: [id number]  HPI 60 yo AA  with HIV that is superbly well controlled, OA, AVN. At my last visit he noted some problems with short-term memory. I performed a Mini-Mental status exam he scored a 28-30 out of 30.  28/30 to 30/30. He missed 2 items on serial 7s but no items on spelling world backwards. I would note that this concern over memory was prompted by the patient himself. When I saw the patient last visit and today he seems quite capable in and it is a highly intelligent and highly focused and had no cognitive deficits whatsoever. He continues on his Norvir Prezista and Truvada. He has had is having problems with allergic rhinitis and also conjunctivitis. Tears him to use a nonsedating at antihistamine along with Benadryl as needed. He did not start taking the Fosamax which I prescribed him because he was concerned about the content of the Fosamax. I've encouraged him to take Fosamax and to work with his primary care physician with regard to his blood pressure. He is taking calcium I would like to also check his bowels a vitamin D levels. No other specific complaints today.  Review of Systems As per history present also with previous systems negative.    Objective:   Physical Exam Quite pleasant gentleman no acute distress wearing dreadlocks. HCG normocephalic atraumatic] round react to light sclerae anicteric oropharynx clear neck supple right vas exam regular rhythm no murmurs or rubs heard lungs with agitation bilaterally without wheeze or rales abdomen soft nondistended nontender extremity without edema neurological exam nonfocal.       Assessment & Plan:  HIV DISEASE Superb control.  HYPERTENSION Being managed for his primary care physician I encouraged him to take his Fosamax and not worry about the salt content in his this medication.  AVASCULAR NECROSIS I encouraged him him to take Fosamax and  calcium. We'll check vitamin D levels at next visit  MEMORY LOSS This seems quite minimal.

## 2010-08-18 NOTE — Assessment & Plan Note (Signed)
Superb control. 

## 2010-08-18 NOTE — Assessment & Plan Note (Signed)
Being managed for his primary care physician I encouraged him to take his Fosamax and not worry about the salt content in his this medication.

## 2010-08-18 NOTE — Patient Instructions (Signed)
GREAT JOB RTC IN 6 MONTHS

## 2010-08-23 LAB — T-HELPER CELL (CD4) - (RCID CLINIC ONLY)
CD4 % Helper T Cell: 22 % — ABNORMAL LOW (ref 33–55)
CD4 T Cell Abs: 520 uL (ref 400–2700)

## 2010-09-21 NOTE — Op Note (Signed)
NAMEKAYLOR, SIMENSON              ACCOUNT NO.:  [id number]   MEDICAL RECORD NO.:  [id number]          PATIENT TYPE:  INP   LOCATION:  2899                         FACILITY:  [facility]   PHYSICIAN:  Lubertha Basque. Dalldorf, M.D.DATE OF BIRTH:  1950-05-31   DATE OF PROCEDURE:  01/16/2007  DATE OF DISCHARGE:                               OPERATIVE REPORT   PREOPERATIVE DIAGNOSIS:  Right hip degenerative arthritis.   POSTOPERATIVE DIAGNOSIS:  Right hip degenerative arthritis.   PROCEDURE:  Right total hip replacement.   ANESTHESIA:  General.   ATTENDING SURGEON:  Lubertha Basque. Jerl Santos, M.D.   ASSISTANT:  Lindwood Qua, P.A.   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  The right side is most  painful.  This has been unresponsive to oral anti-inflammatories and  walking appliances.  He has pain which limits his ability to rest and  walk and is offered a hip replacement on the right.  Informed operative  consent was obtained after discussion of possible complications of  reaction to anesthesia, infection, DVT, PE, dislocation, and death.   SUMMARY OF FINDINGS AND PROCEDURE:  Under general anesthesia through a  posterior approach, a right total hip replacement was performed.  He had  advanced degenerative change with collapse of the femoral head but  excellent bone quality.  We addressed this problem with an uncemented  DePuy system using a size high offset 5 Summit stem with a 52 ASR cup.  We used a 46 +2 metal head.  Bryna Colander assisted throughout and was  invaluable to the completion of the case in that he helped position and  retract while I performed the procedure.  He also closed simultaneously  to help minimize OR time.   DESCRIPTION OF PROCEDURE:  The patient was taken to operating suite  where general anesthetic was applied without difficulty.  He was  positioned in the lateral decubitus position with the  right hip up.  An  axillary roll was placed and all bony prominences were appropriately  padded.  We did use hip positioners.  He was then prepped and draped in  normal sterile fashion.  After administration of IV Kefzol the posterior  approach was taken to the right hip.  A skin incision was made.  Dissection down to the IT band and gluteus maximus fascia.  These were  incised longitudinally to expose the short external rotators of the hip  which were tagged and reflected.  A posterior capsulectomy was  performed.  The hip was dislocated.  I made a femoral neck cut above the  lesser trochanter.  He had advanced degenerative change but excellent  bone quality.  The acetabulum was fully exposed and some labral tissues  were excised.  I then reamed towards the inside wall of the pelvis and  then sequentially up to size 51 followed by placement of size 52 ASR cup  in appropriate anteversion and tilt.  We then turned our attention to  the femur.  This was reamed and broached up to a size 5.  A trial  reduction was done with this component and the +2,46 high offset  assembly seemed to be most stable.  It was stable in extension with  external rotation and flexion with internal rotation.  Leg lengths were  judged to be roughly equal with slightly perhaps longer length on this  side having corrected for the femoral head collapse.  We will operate on  the other hip in the future and equalize his leg lengths at that point.  Trial components were removed followed by placement of size 5 high  offset Summit stem in appropriate anteversion.  This was then capped  with a 42 +2 ball assembly.  The hip was again reduced and was stable.  The wound was irrigated followed by reapproximation of the short  external rotators to the gluteus maximus region with nonabsorbable  sutures.  IT band and gluteus maximus fascia reapproximated with same  type of suture in interrupted fashion.  Subcutaneous tissues   reapproximated with 0 and 2-0 undyed Vicryl followed by skin closure  with staples.  Adaptic was applied followed by dry gauze and tape.  Estimated blood loss and intraoperative fluids can be obtained from  anesthesia records.   DISPOSITION:  The patient was extubated in the operating room and taken  to recovery in stable addition.  He is to be admitted to the orthopedic  surgery service for appropriate postop care to include perioperative  antibiotics and Coumadin plus Lovenox for DVT prophylaxis.      Lubertha Basque Jerl Santos, M.D.  Electronically Signed     PGD/MEDQ  D:  01/16/2007  T:  01/16/2007  Job:  [id number]

## 2010-09-24 NOTE — Discharge Summary (Signed)
NAMEASIM, GERSTEN              ACCOUNT NO.:  [id number]   MEDICAL RECORD NO.:  [id number]          PATIENT TYPE:  INP   LOCATION:  5024                         FACILITY:  [facility]   PHYSICIAN:  Lubertha Basque. Dalldorf, M.D.DATE OF BIRTH:  [birth year]   DATE OF ADMISSION:  01/16/2007  DATE OF DISCHARGE:  01/20/2007                               DISCHARGE SUMMARY   ADMITTING DIAGNOSES:  1. Right hip end-stage DJD.  2. Positive HIV status.  3. Hypertension.  4. Tobacco abuse.   DISCHARGE DIAGNOSES:  1. Right hip end-stage DJD.  2. Positive HIV status.  3. Hypertension.  4. Tobacco abuse.   OPERATIONS:  Right total hip replacement.   BRIEF HISTORY:  Kenly is a 60 year old black male who is HIV positive.  He is having increasing right hip pain when he ambulates.  His x-rays  reveal end-stage DJD bone-on-bone arthritis.  He is now having night  discomfort and we have discussed with him treatment options, that being  total hip replacement with the risk of anesthesia, DVT and possible  death.   PERTINENT LABORATORY DATA AND X-RAY FINDINGS:  Chest X-Ray: No active  disease.  WBCs 8.0, hemoglobin 10.4, hematocrit 30.0, platelets 168.  Serial INRs were done on low-dose Coumadin protocol, regulated by  pharmacy.  Sodium 134, potassium 3.5, glucose 108, BUN 7, creatinine  0.80.   COURSE IN HOSPITAL:  He was admitted postoperatively.  Placed on a  variety of p.o. IM analgesics for pain, IV Ancef 1 gram q. 8 hours x3  doses and was kept on his home medications which will be outlined at the  end of this dictation.  He also had Coumadin and Lovenox DVT prophylaxis  regulated by pharmacy.  Physical therapy to be out of bed, touchdown  weightbearing with crutches or walker.  He employed the usual knee-high  TEDs, incentive spirometry and nasal cannula O2 the first 2 days postop.  He was also kept on his home medicines, once again will be outlined at  the end of this dictation.  The first day  postoperatively his vital  signs were stable.  His wound was benign.  He was voiding.  No sign of  infection or irritation.  Normal neurovascular status.  Dressing was  changed postop day #2, at which time his blood pressure was 123/71,  temperature 99, hemoglobin 11.8, thigh was soft.  He was having some  difficulties with hiccups, and we had given him some Thorazine which did  seem to help calm this down.  His IVs were discontinued.  His PCA pump  was discontinued.  He was voiding well, eating, progressed well and was  discharged home.   CONDITION ON DISCHARGE:  Improved.   FOLLOW UP:  He will remain on Kaletra one b.i.d., Truvada one daily,  lisinopril one daily, magnesium one daily, in addition, Percocet 1-2 q.4-  6 p.r.n. pain as needed, Coumadin dose regulated by pharmacy which  initially will be 5 mg a day and then an INR will be drawn 2 days after  his return home.  He will also have home physical therapy and  home INRs by [facility].  May change dressing daily.  He is on a  low-sodium, heart-healthy diet, crutches, touchdown weightbearing.  Any  sign of infection to call our office at [phone] or any problems we  would see him in 7-10 days.  lack of primary VBAC.  Over 60      Lindwood Qua, P.A.      Lubertha Basque Jerl Santos, M.D.  Electronically Signed    MC/MEDQ  D:  03/13/2007  T:  03/14/2007  Job:  [id number]

## 2010-11-15 ENCOUNTER — Other Ambulatory Visit: Payer: Self-pay | Admitting: Infectious Disease

## 2011-02-15 LAB — T-HELPER CELL (CD4) - (RCID CLINIC ONLY): CD4 T Cell Abs: 400

## 2011-02-18 LAB — HEPATIC FUNCTION PANEL
ALT: 44
AST: 34
Albumin: 3.8
Alkaline Phosphatase: 119 — ABNORMAL HIGH
Bilirubin, Direct: 0.2
Total Bilirubin: 0.9

## 2011-02-18 LAB — CBC
HCT: 30.8 — ABNORMAL LOW
HCT: 33.7 — ABNORMAL LOW
HCT: 40.1
Hemoglobin: 11 — ABNORMAL LOW
Hemoglobin: 14
MCHC: 34.5
MCHC: 35.3
MCHC: 35.7
MCV: 94.1
MCV: 94.8
Platelets: 168
Platelets: 194
RBC: 4.27
RDW: 12.8
RDW: 12.8
RDW: 13.1
WBC: 7.4
WBC: 8.9

## 2011-02-18 LAB — BASIC METABOLIC PANEL
BUN: 7
BUN: 7
BUN: 7
BUN: 9
CO2: 28
CO2: 28
CO2: 29
CO2: 30
Calcium: 8.4
Chloride: 102
Chloride: 108
Chloride: 97
Creatinine, Ser: 0.8
GFR calc non Af Amer: >60
Glucose, Bld: 102 — ABNORMAL HIGH
Glucose, Bld: 84
Potassium: 3.6
Potassium: 3.6
Potassium: 3.9
Sodium: 134 — ABNORMAL LOW

## 2011-02-18 LAB — PROTIME-INR
INR: 0.9
Prothrombin Time: 21.3 — ABNORMAL HIGH

## 2011-03-02 ENCOUNTER — Other Ambulatory Visit: Payer: Self-pay | Admitting: Licensed Clinical Social Worker

## 2011-03-02 ENCOUNTER — Other Ambulatory Visit: Payer: Self-pay | Admitting: Infectious Disease

## 2011-03-02 ENCOUNTER — Other Ambulatory Visit (INDEPENDENT_AMBULATORY_CARE_PROVIDER_SITE_OTHER): Payer: Medicare Other

## 2011-03-02 DIAGNOSIS — B20 Human immunodeficiency virus [HIV] disease: Secondary | ICD-10-CM

## 2011-03-02 DIAGNOSIS — Z113 Encounter for screening for infections with a predominantly sexual mode of transmission: Secondary | ICD-10-CM

## 2011-03-02 DIAGNOSIS — E785 Hyperlipidemia, unspecified: Secondary | ICD-10-CM

## 2011-03-02 DIAGNOSIS — Z79899 Other long term (current) drug therapy: Secondary | ICD-10-CM

## 2011-03-02 LAB — CBC WITH DIFFERENTIAL/PLATELET
Basophils Absolute: 0 10*3/uL (ref 0.0–0.1)
Eosinophils Relative: 3 % (ref 0–5)
HCT: 43.8 % (ref 39.0–52.0)
Lymphocytes Relative: 30 % (ref 12–46)
MCHC: 34.7 g/dL (ref 30.0–36.0)
MCV: 92.6 fL (ref 78.0–100.0)
Monocytes Absolute: 0.3 10*3/uL (ref 0.1–1.0)
RDW: 12.8 % (ref 11.5–15.5)

## 2011-03-02 LAB — RPR

## 2011-03-02 LAB — COMPREHENSIVE METABOLIC PANEL
AST: 20 U/L (ref 0–37)
BUN: 9 mg/dL (ref 6–23)
Calcium: 9.5 mg/dL (ref 8.4–10.5)
Chloride: 105 mEq/L (ref 96–112)
Creat: 0.93 mg/dL (ref 0.50–1.35)

## 2011-03-02 LAB — LIPID PANEL
Cholesterol: 221 mg/dL — ABNORMAL HIGH (ref 0–200)
Total CHOL/HDL Ratio: 5.7 Ratio

## 2011-03-03 LAB — T-HELPER CELL (CD4) - (RCID CLINIC ONLY): CD4 T Cell Abs: 680 uL (ref 400–2700)

## 2011-03-03 LAB — GC/CHLAMYDIA PROBE AMP, URINE
Chlamydia, Swab/Urine, PCR: NEGATIVE
GC Probe Amp, Urine: NEGATIVE

## 2011-03-04 LAB — HIV-1 RNA QUANT-NO REFLEX-BLD

## 2011-03-07 LAB — VITAMIN D 1,25 DIHYDROXY
Vitamin D2 1, 25 (OH)2: 22 pg/mL
Vitamin D3 1, 25 (OH)2: 81 pg/mL

## 2011-03-16 ENCOUNTER — Ambulatory Visit (INDEPENDENT_AMBULATORY_CARE_PROVIDER_SITE_OTHER): Payer: Medicare Other | Admitting: Infectious Disease

## 2011-03-16 ENCOUNTER — Encounter: Payer: Self-pay | Admitting: Infectious Disease

## 2011-03-16 VITALS — BP 134/88 | HR 73 | Temp 97.6°F | Wt 179.5 lb

## 2011-03-16 DIAGNOSIS — Z23 Encounter for immunization: Secondary | ICD-10-CM

## 2011-03-16 DIAGNOSIS — B20 Human immunodeficiency virus [HIV] disease: Secondary | ICD-10-CM

## 2011-03-16 DIAGNOSIS — F17200 Nicotine dependence, unspecified, uncomplicated: Secondary | ICD-10-CM

## 2011-03-16 DIAGNOSIS — E785 Hyperlipidemia, unspecified: Secondary | ICD-10-CM

## 2011-03-16 NOTE — Assessment & Plan Note (Signed)
counselled to quit 

## 2011-03-16 NOTE — Assessment & Plan Note (Signed)
Continue prezista norvir and truvada. Review prior ARV history and consider to change to non PI regimen to lower cholesterol

## 2011-03-16 NOTE — Assessment & Plan Note (Signed)
Would like to start statin but he is not ready. Consider combo pil with bp med statin. Also could consider change to non PI regimen

## 2011-03-16 NOTE — Progress Notes (Signed)
  Subjective:    Patient ID: Wayne Santos, male    DOB: 10/30/1950, 60 y.o.   MRN: [id number]  HPI  Wayne Santos is a 60 y.o. male who is doing superbly well on their antiviral regimen, with undetectable viral load and health cd4 count. On prezista, norvir and truvada with perfect virological suppression who retrurns for followup. We discussed all of his labs including his elevated LDL in context of his smoking and bP on meds and his ATP calculated risk of greater than 20% of MI. He did not however want to yet start a statin. He is not ready to stop smoking either   Review of Systems  Constitutional: Negative for fever, chills, diaphoresis, activity change, appetite change, fatigue and unexpected weight change.  HENT: Negative for congestion, sore throat, rhinorrhea, sneezing, trouble swallowing and sinus pressure.   Eyes: Negative for photophobia and visual disturbance.  Respiratory: Negative for cough, chest tightness, shortness of breath, wheezing and stridor.   Cardiovascular: Negative for chest pain, palpitations and leg swelling.  Gastrointestinal: Negative for nausea, vomiting, abdominal pain, diarrhea, constipation, blood in stool, abdominal distention and anal bleeding.  Genitourinary: Negative for dysuria, hematuria, flank pain and difficulty urinating.  Musculoskeletal: Negative for myalgias, back pain, joint swelling, arthralgias and gait problem.  Skin: Negative for color change, pallor, rash and wound.  Neurological: Negative for dizziness, tremors, weakness and light-headedness.  Hematological: Negative for adenopathy. Does not bruise/bleed easily.  Psychiatric/Behavioral: Negative for behavioral problems, confusion, sleep disturbance, dysphoric mood, decreased concentration and agitation.       Objective:   Physical Exam  Constitutional: He is oriented to person, place, and time. He appears well-developed and well-nourished. No distress.  HENT:  Head: Normocephalic and  atraumatic.  Mouth/Throat: Oropharynx is clear and moist. No oropharyngeal exudate.  Eyes: Conjunctivae and EOM are normal. Pupils are equal, round, and reactive to light. No scleral icterus.  Neck: Normal range of motion. Neck supple. No JVD present.  Cardiovascular: Normal rate, regular rhythm and normal heart sounds.  Exam reveals no gallop and no friction rub.   No murmur heard. Pulmonary/Chest: Effort normal and breath sounds normal. No respiratory distress. He has no wheezes. He has no rales. He exhibits no tenderness.  Abdominal: He exhibits no distension and no mass. There is no tenderness. There is no rebound and no guarding.  Musculoskeletal: He exhibits no edema and no tenderness.  Lymphadenopathy:    He has no cervical adenopathy.  Neurological: He is alert and oriented to person, place, and time. He has normal reflexes. He exhibits normal muscle tone. Coordination normal.  Skin: Skin is warm and dry. He is not diaphoretic. No erythema. No pallor.  Psychiatric: He has a normal mood and affect. His behavior is normal. Judgment and thought content normal.          Assessment & Plan:  HIV DISEASE Continue prezista norvir and truvada. Review prior ARV history and consider to change to non PI regimen to lower cholesterol  Hyperlipidemia Would like to start statin but he is not ready. Consider combo pil with bp med statin. Also could consider change to non PI regimen  SMOKER counselled to quit

## 2011-03-28 ENCOUNTER — Other Ambulatory Visit: Payer: Self-pay | Admitting: *Deleted

## 2011-03-28 DIAGNOSIS — B20 Human immunodeficiency virus [HIV] disease: Secondary | ICD-10-CM

## 2011-03-28 MED ORDER — EMTRICITABINE-TENOFOVIR DF 200-300 MG PO TABS: 1.0000 | ORAL_TABLET | Freq: Every day | ORAL | Status: DC

## 2011-03-28 MED ORDER — DARUNAVIR ETHANOLATE 400 MG PO TABS: 400.0000 mg | ORAL_TABLET | Freq: Two times a day (BID) | ORAL | Status: DC

## 2011-03-28 MED ORDER — RITONAVIR 100 MG PO CAPS: 100.0000 mg | ORAL_CAPSULE | Freq: Every day | ORAL | Status: DC

## 2011-08-08 ENCOUNTER — Other Ambulatory Visit: Payer: Self-pay | Admitting: *Deleted

## 2011-08-08 DIAGNOSIS — B20 Human immunodeficiency virus [HIV] disease: Secondary | ICD-10-CM

## 2011-08-08 MED ORDER — EMTRICITABINE-TENOFOVIR DF 200-300 MG PO TABS: 1.0000 | ORAL_TABLET | Freq: Every day | ORAL | Status: DC

## 2011-08-08 MED ORDER — RITONAVIR 100 MG PO CAPS: 100.0000 mg | ORAL_CAPSULE | Freq: Every day | ORAL | Status: DC

## 2011-08-08 MED ORDER — DARUNAVIR ETHANOLATE 400 MG PO TABS: 400.0000 mg | ORAL_TABLET | Freq: Two times a day (BID) | ORAL | Status: DC

## 2011-10-07 ENCOUNTER — Other Ambulatory Visit: Payer: Self-pay | Admitting: *Deleted

## 2011-10-07 DIAGNOSIS — B20 Human immunodeficiency virus [HIV] disease: Secondary | ICD-10-CM

## 2011-10-07 MED ORDER — DARUNAVIR ETHANOLATE 800 MG PO TABS: 800.0000 mg | ORAL_TABLET | Freq: Every day | ORAL | Status: DC

## 2011-11-29 ENCOUNTER — Other Ambulatory Visit: Payer: Self-pay | Admitting: *Deleted

## 2011-11-29 DIAGNOSIS — B20 Human immunodeficiency virus [HIV] disease: Secondary | ICD-10-CM

## 2011-11-29 MED ORDER — EMTRICITABINE-TENOFOVIR DF 200-300 MG PO TABS: 1.0000 | ORAL_TABLET | Freq: Every day | ORAL | Status: DC

## 2011-11-29 MED ORDER — DARUNAVIR ETHANOLATE 800 MG PO TABS: 800.0000 mg | ORAL_TABLET | Freq: Every day | ORAL | Status: DC

## 2011-11-29 MED ORDER — RITONAVIR 100 MG PO CAPS: 100.0000 mg | ORAL_CAPSULE | Freq: Every day | ORAL | Status: DC

## 2011-12-27 ENCOUNTER — Other Ambulatory Visit: Payer: Self-pay | Admitting: *Deleted

## 2011-12-27 DIAGNOSIS — B20 Human immunodeficiency virus [HIV] disease: Secondary | ICD-10-CM

## 2011-12-27 MED ORDER — RITONAVIR 100 MG PO CAPS: 100.0000 mg | ORAL_CAPSULE | Freq: Every day | ORAL | Status: DC

## 2011-12-27 MED ORDER — DARUNAVIR ETHANOLATE 800 MG PO TABS: 800.0000 mg | ORAL_TABLET | Freq: Every day | ORAL | Status: DC

## 2011-12-27 MED ORDER — EMTRICITABINE-TENOFOVIR DF 200-300 MG PO TABS: 1.0000 | ORAL_TABLET | Freq: Every day | ORAL | Status: DC

## 2011-12-29 ENCOUNTER — Other Ambulatory Visit (HOSPITAL_COMMUNITY)
Admission: RE | Admit: 2011-12-29 | Discharge: 2011-12-29 | Disposition: A | Discharge status: Home / Self Care | Payer: Medicare Other | Source: Ambulatory Visit | Attending: Infectious Disease | Admitting: Infectious Disease

## 2011-12-29 ENCOUNTER — Other Ambulatory Visit: Payer: Self-pay | Admitting: Infectious Disease

## 2011-12-29 ENCOUNTER — Other Ambulatory Visit: Payer: Medicare Other

## 2011-12-29 DIAGNOSIS — Z113 Encounter for screening for infections with a predominantly sexual mode of transmission: Secondary | ICD-10-CM | POA: Insufficient documentation

## 2011-12-29 DIAGNOSIS — B20 Human immunodeficiency virus [HIV] disease: Secondary | ICD-10-CM

## 2011-12-29 LAB — CBC WITH DIFFERENTIAL/PLATELET
Basophils Absolute: 0 10*3/uL (ref 0.0–0.1)
HCT: 39.8 % (ref 39.0–52.0)
Hemoglobin: 14.1 g/dL (ref 13.0–17.0)
Lymphocytes Relative: 36 % (ref 12–46)
Monocytes Absolute: 0.5 10*3/uL (ref 0.1–1.0)
Monocytes Relative: 7 % (ref 3–12)
Neutro Abs: 3.6 10*3/uL (ref 1.7–7.7)
WBC: 6.7 10*3/uL (ref 4.0–10.5)

## 2011-12-29 LAB — COMPLETE METABOLIC PANEL WITH GFR
ALT: 19 U/L (ref 0–53)
CO2: 25 mEq/L (ref 19–32)
Calcium: 9.4 mg/dL (ref 8.4–10.5)
Chloride: 107 mEq/L (ref 96–112)
GFR, Est African American: 80 mL/min
Sodium: 141 mEq/L (ref 135–145)
Total Protein: 6.9 g/dL (ref 6.0–8.3)

## 2011-12-29 LAB — LIPID PANEL: LDL Cholesterol: 150 mg/dL — ABNORMAL HIGH (ref 0–99)

## 2011-12-30 LAB — HIV-1 RNA QUANT-NO REFLEX-BLD: HIV-1 RNA Quant, Log: <1.3 {Log} (ref <1.30)

## 2011-12-30 LAB — RPR

## 2011-12-30 LAB — T-HELPER CELL (CD4) - (RCID CLINIC ONLY): CD4 T Cell Abs: 700 uL (ref 400–2700)

## 2012-01-26 ENCOUNTER — Ambulatory Visit: Payer: Medicare Other | Admitting: Infectious Disease

## 2012-01-27 ENCOUNTER — Other Ambulatory Visit: Payer: Self-pay | Admitting: *Deleted

## 2012-01-27 DIAGNOSIS — B20 Human immunodeficiency virus [HIV] disease: Secondary | ICD-10-CM

## 2012-01-27 MED ORDER — RITONAVIR 100 MG PO CAPS: 100.0000 mg | ORAL_CAPSULE | Freq: Every day | ORAL | Status: DC

## 2012-01-27 MED ORDER — EMTRICITABINE-TENOFOVIR DF 200-300 MG PO TABS: 1.0000 | ORAL_TABLET | Freq: Every day | ORAL | Status: DC

## 2012-01-27 MED ORDER — DARUNAVIR ETHANOLATE 800 MG PO TABS: 800.0000 mg | ORAL_TABLET | Freq: Every day | ORAL | Status: DC

## 2012-02-02 ENCOUNTER — Encounter: Payer: Self-pay | Admitting: Infectious Disease

## 2012-02-02 ENCOUNTER — Ambulatory Visit (INDEPENDENT_AMBULATORY_CARE_PROVIDER_SITE_OTHER): Payer: Medicare Other | Admitting: Infectious Disease

## 2012-02-02 VITALS — BP 133/84 | HR 80 | Temp 98.0°F | Ht 69.0 in | Wt 185.0 lb

## 2012-02-02 DIAGNOSIS — Z23 Encounter for immunization: Secondary | ICD-10-CM

## 2012-02-02 DIAGNOSIS — F17200 Nicotine dependence, unspecified, uncomplicated: Secondary | ICD-10-CM

## 2012-02-02 DIAGNOSIS — E785 Hyperlipidemia, unspecified: Secondary | ICD-10-CM

## 2012-02-02 DIAGNOSIS — Z21 Asymptomatic human immunodeficiency virus [HIV] infection status: Secondary | ICD-10-CM

## 2012-02-02 DIAGNOSIS — B20 Human immunodeficiency virus [HIV] disease: Secondary | ICD-10-CM

## 2012-02-02 DIAGNOSIS — M8700 Idiopathic aseptic necrosis of unspecified bone: Secondary | ICD-10-CM

## 2012-02-02 MED ORDER — NICOTINE 7 MG/24HR TD PT24: 1.0000 | MEDICATED_PATCH | TRANSDERMAL | Status: DC

## 2012-02-02 MED ORDER — RITONAVIR 100 MG PO CAPS: 100.0000 mg | ORAL_CAPSULE | Freq: Every day | ORAL | Status: DC

## 2012-02-02 MED ORDER — DARUNAVIR ETHANOLATE 800 MG PO TABS: 800.0000 mg | ORAL_TABLET | Freq: Every day | ORAL | Status: DC

## 2012-02-02 MED ORDER — NICOTINE 14 MG/24HR TD PT24: 1.0000 | MEDICATED_PATCH | TRANSDERMAL | Status: DC

## 2012-02-02 MED ORDER — NICOTINE 21 MG/24HR TD PT24: 1.0000 | MEDICATED_PATCH | TRANSDERMAL | Status: DC

## 2012-02-02 MED ORDER — EMTRICITABINE-TENOFOVIR DF 200-300 MG PO TABS: 1.0000 | ORAL_TABLET | Freq: Every day | ORAL | Status: DC

## 2012-02-02 MED ORDER — PRAVASTATIN SODIUM 40 MG PO TABS: 40.0000 mg | ORAL_TABLET | Freq: Every day | ORAL | Status: DC

## 2012-02-02 NOTE — Assessment & Plan Note (Signed)
Nicoderm patch as prescribed

## 2012-02-02 NOTE — Progress Notes (Signed)
  Subjective:    Patient ID: Wayne Santos, male    DOB: [birth year], 61 y.o.   MRN: [id number]  HPI  Wayne Santos is a 61 y.o. male who is doing superbly well on his  antiviral regimen, Prezista Norvir Truvada with undetectable viral load and health cd4 count. He did smoke less than a pack per day but is interested in quitting I'll prescribe nicotine patches. Reviewed his cholesterol findings and his ATP 3 calculated risk of suffering myocardial infarction. We'll therefore start him on Pravachol at 40 mg daily to help lower his bad cholesterol. He claims to be taking his amlodipine for blood pressure. He has not started taking his Fosamax nor is he taking what vitamin D or calcium replacement. We'll check a serum testosterone and vitamin D today. He has received his influenza vaccination today. He is waiting on having his opposite hip replaced for his avascular necrosis.   Review of Systems  Constitutional: Negative for fever, chills, diaphoresis, activity change, appetite change, fatigue and unexpected weight change.  HENT: Negative for congestion, sore throat, rhinorrhea, sneezing, trouble swallowing and sinus pressure.   Eyes: Negative for photophobia and visual disturbance.  Respiratory: Negative for cough, chest tightness, shortness of breath, wheezing and stridor.   Cardiovascular: Negative for chest pain, palpitations and leg swelling.  Gastrointestinal: Negative for nausea, vomiting, abdominal pain, diarrhea, constipation, blood in stool, abdominal distention and anal bleeding.  Genitourinary: Negative for dysuria, hematuria, flank pain and difficulty urinating.  Musculoskeletal: Positive for back pain and arthralgias. Negative for myalgias, joint swelling and gait problem.  Skin: Negative for color change, pallor, rash and wound.  Neurological: Negative for dizziness, tremors, weakness and light-headedness.  Hematological: Negative for adenopathy. Does not bruise/bleed easily.    Psychiatric/Behavioral: Negative for behavioral problems, confusion, disturbed wake/sleep cycle, dysphoric mood, decreased concentration and agitation.       Objective:   Physical Exam  Constitutional: He is oriented to person, place, and time. He appears well-developed and well-nourished. No distress.  HENT:  Head: Normocephalic and atraumatic.  Mouth/Throat: Oropharynx is clear and moist. No oropharyngeal exudate.  Eyes: Conjunctivae normal and EOM are normal. Pupils are equal, round, and reactive to light. No scleral icterus.  Neck: Normal range of motion. Neck supple. No JVD present.  Cardiovascular: Normal rate, regular rhythm and normal heart sounds.  Exam reveals no gallop and no friction rub.   No murmur heard. Pulmonary/Chest: Effort normal and breath sounds normal. No respiratory distress. He has no wheezes. He has no rales. He exhibits no tenderness.  Abdominal: He exhibits no distension and no mass. There is no tenderness. There is no rebound and no guarding.  Musculoskeletal: He exhibits no edema and no tenderness.  Lymphadenopathy:    He has no cervical adenopathy.  Neurological: He is alert and oriented to person, place, and time. He has normal reflexes. He exhibits normal muscle tone. Coordination normal.  Skin: Skin is warm and dry. He is not diaphoretic. No erythema. No pallor.  Psychiatric: He has a normal mood and affect. His behavior is normal. Judgment and thought content normal.          Assessment & Plan:  HIV DISEASE Perfect control continue Prezista Norvir Truvada  AVASCULAR NECROSIS To have opposite hip replaced. Like him to start his Fosamax as well as calcium and vitamin D repletion. We'll check a testosterone level as well.  SMOKER Nicoderm patch as prescribed

## 2012-02-02 NOTE — Assessment & Plan Note (Signed)
Perfect control continue Prezista Norvir Truvada

## 2012-02-02 NOTE — Assessment & Plan Note (Signed)
To have opposite hip replaced. Like him to start his Fosamax as well as calcium and vitamin D repletion. We'll check a testosterone level as well.

## 2012-02-03 LAB — VITAMIN D 25 HYDROXY (VIT D DEFICIENCY, FRACTURES): Vit D, 25-Hydroxy: 24 ng/mL — ABNORMAL LOW (ref 30–89)

## 2012-02-03 LAB — TESTOSTERONE: Testosterone: 297.91 ng/dL — ABNORMAL LOW (ref 300–890)

## 2012-02-06 LAB — VITAMIN D 1,25 DIHYDROXY: Vitamin D2 1, 25 (OH)2: 16 pg/mL

## 2012-02-26 ENCOUNTER — Telehealth: Payer: Self-pay | Admitting: Infectious Disease

## 2012-02-26 DIAGNOSIS — M8700 Idiopathic aseptic necrosis of unspecified bone: Secondary | ICD-10-CM

## 2012-02-26 NOTE — Telephone Encounter (Signed)
His vitamin D level was low and his testosterone barely below normal.   Can we check if he is taking vitamin D now? I would like to repeat his testosterone level, PSA and then I will likely start him on low dose testosterone replacmeent

## 2012-02-27 NOTE — Telephone Encounter (Signed)
Patient just received vitamin d in the mail and he will start taking it today. He also wants to wait until his next lab visit in March is that ok? He states that he feels fine and he doesn't want to start on new medications.

## 2012-02-27 NOTE — Telephone Encounter (Signed)
THAT IS OK

## 2012-07-19 ENCOUNTER — Other Ambulatory Visit (HOSPITAL_COMMUNITY)
Admission: RE | Admit: 2012-07-19 | Discharge: 2012-07-19 | Disposition: A | Discharge status: Home / Self Care | Payer: Medicare Other | Source: Ambulatory Visit | Attending: Infectious Disease | Admitting: Infectious Disease

## 2012-07-19 ENCOUNTER — Other Ambulatory Visit (INDEPENDENT_AMBULATORY_CARE_PROVIDER_SITE_OTHER): Payer: Medicare Other

## 2012-07-19 ENCOUNTER — Other Ambulatory Visit: Payer: Self-pay | Admitting: Infectious Disease

## 2012-07-19 DIAGNOSIS — E785 Hyperlipidemia, unspecified: Secondary | ICD-10-CM

## 2012-07-19 DIAGNOSIS — Z113 Encounter for screening for infections with a predominantly sexual mode of transmission: Secondary | ICD-10-CM | POA: Insufficient documentation

## 2012-07-19 LAB — LIPID PANEL
LDL Cholesterol: 100 mg/dL — ABNORMAL HIGH (ref 0–99)
Triglycerides: 90 mg/dL (ref <150)
VLDL: 18 mg/dL (ref 0–40)

## 2012-07-19 LAB — CBC WITH DIFFERENTIAL/PLATELET
Basophils Absolute: 0 10*3/uL (ref 0.0–0.1)
Basophils Relative: 1 % (ref 0–1)
Eosinophils Absolute: 0.3 10*3/uL (ref 0.0–0.7)
Eosinophils Relative: 5 % (ref 0–5)
HCT: 40.7 % (ref 39.0–52.0)
MCH: 30.5 pg (ref 26.0–34.0)
MCHC: 34.9 g/dL (ref 30.0–36.0)
MCV: 87.3 fL (ref 78.0–100.0)
Monocytes Absolute: 0.3 10*3/uL (ref 0.1–1.0)
RDW: 13.4 % (ref 11.5–15.5)

## 2012-07-19 LAB — COMPREHENSIVE METABOLIC PANEL
ALT: 18 U/L (ref 0–53)
AST: 18 U/L (ref 0–37)
Alkaline Phosphatase: 108 U/L (ref 39–117)
Calcium: 9.3 mg/dL (ref 8.4–10.5)
Chloride: 108 mEq/L (ref 96–112)
Creat: 0.89 mg/dL (ref 0.50–1.35)

## 2012-07-19 LAB — PSA, MEDICARE: PSA: 1.75 ng/mL (ref <=4.00)

## 2012-07-20 LAB — TESTOSTERONE, FREE, TOTAL, SHBG
Testosterone, Free: 55.8 pg/mL (ref 47.0–244.0)
Testosterone-% Free: 1.1 % — ABNORMAL LOW (ref 1.6–2.9)

## 2012-07-20 LAB — T-HELPER CELL (CD4) - (RCID CLINIC ONLY): CD4 % Helper T Cell: 33 % (ref 33–55)

## 2012-08-02 ENCOUNTER — Encounter: Payer: Self-pay | Admitting: Infectious Disease

## 2012-08-02 ENCOUNTER — Ambulatory Visit (INDEPENDENT_AMBULATORY_CARE_PROVIDER_SITE_OTHER): Payer: Medicare Other | Admitting: Infectious Disease

## 2012-08-02 VITALS — BP 146/84 | HR 89 | Temp 98.3°F | Wt 180.0 lb

## 2012-08-02 DIAGNOSIS — I10 Essential (primary) hypertension: Secondary | ICD-10-CM

## 2012-08-02 DIAGNOSIS — M8700 Idiopathic aseptic necrosis of unspecified bone: Secondary | ICD-10-CM

## 2012-08-02 DIAGNOSIS — B20 Human immunodeficiency virus [HIV] disease: Secondary | ICD-10-CM

## 2012-08-02 DIAGNOSIS — F17200 Nicotine dependence, unspecified, uncomplicated: Secondary | ICD-10-CM

## 2012-08-02 NOTE — Progress Notes (Signed)
  Subjective:    Patient ID: Wayne Santos, male    DOB: October 21, 1950, 62 y.o.   MRN: [id number]  HPI   Wayne Santos is a 62 y.o. male who is doing superbly well on his  antiviral regimen, Prezista Norvir Truvada with undetectable viral load and health cd4 count. He is cut down to pack per day but not wanting chantix. He does not want nicotine patches. He claims to be taking fosamax, vitamin d and calcium. He also claimed to have had his pneumovax--though we found no documentation of this and both my CMA and I do not recall him getting 2 shots at last visit as he claims.   Review of Systems  Constitutional: Negative for fever, chills, diaphoresis, activity change, appetite change, fatigue and unexpected weight change.  HENT: Negative for congestion, sore throat, rhinorrhea, sneezing, trouble swallowing and sinus pressure.   Eyes: Negative for photophobia and visual disturbance.  Respiratory: Negative for cough, chest tightness, shortness of breath, wheezing and stridor.   Cardiovascular: Negative for chest pain, palpitations and leg swelling.  Gastrointestinal: Negative for nausea, vomiting, abdominal pain, diarrhea, constipation, blood in stool, abdominal distention and anal bleeding.  Genitourinary: Negative for dysuria, hematuria, flank pain and difficulty urinating.  Musculoskeletal: Positive for back pain and arthralgias. Negative for myalgias, joint swelling and gait problem.  Skin: Negative for color change, pallor, rash and wound.  Neurological: Negative for dizziness, tremors, weakness and light-headedness.  Hematological: Negative for adenopathy. Does not bruise/bleed easily.  Psychiatric/Behavioral: Negative for behavioral problems, confusion, sleep disturbance, dysphoric mood, decreased concentration and agitation.       Objective:   Physical Exam  Constitutional: He is oriented to person, place, and time. He appears well-developed and well-nourished. No distress.  HENT:   Head: Normocephalic and atraumatic.  Mouth/Throat: Oropharynx is clear and moist. No oropharyngeal exudate.  Eyes: Conjunctivae and EOM are normal. Pupils are equal, round, and reactive to light. No scleral icterus.  Neck: Normal range of motion. Neck supple. No JVD present.  Cardiovascular: Normal rate, regular rhythm and normal heart sounds.  Exam reveals no gallop and no friction rub.   No murmur heard. Pulmonary/Chest: Effort normal and breath sounds normal. No respiratory distress. He has no wheezes. He has no rales. He exhibits no tenderness.  Abdominal: He exhibits no distension and no mass. There is no tenderness. There is no rebound and no guarding.  Musculoskeletal: He exhibits no edema and no tenderness.  Lymphadenopathy:    He has no cervical adenopathy.  Neurological: He is alert and oriented to person, place, and time. He has normal reflexes. He exhibits normal muscle tone. Coordination normal.  Skin: Skin is warm and dry. He is not diaphoretic. No erythema. No pallor.  Psychiatric: He has a normal mood and affect. His behavior is normal. Judgment and thought content normal.          Assessment & Plan:  HIV: continue current regimen. I have no records of him having had ARV resistance in the past. I would consider change to [place] and truvada in this pt in future  HTN: continue norvasc and fu with PCP  AVN: will need other hip replaced as well, continue fosomax, vitamin d  Smoking: continue to work on cessation

## 2012-08-20 ENCOUNTER — Other Ambulatory Visit: Payer: Self-pay | Admitting: Licensed Clinical Social Worker

## 2012-08-20 DIAGNOSIS — B20 Human immunodeficiency virus [HIV] disease: Secondary | ICD-10-CM

## 2012-08-20 MED ORDER — DARUNAVIR ETHANOLATE 800 MG PO TABS: 800.0000 mg | ORAL_TABLET | Freq: Every day | ORAL | Status: DC

## 2012-08-20 MED ORDER — RITONAVIR 100 MG PO CAPS: 100.0000 mg | ORAL_CAPSULE | Freq: Every day | ORAL | Status: DC

## 2012-08-20 MED ORDER — EMTRICITABINE-TENOFOVIR DF 200-300 MG PO TABS: 1.0000 | ORAL_TABLET | Freq: Every day | ORAL | Status: DC

## 2013-01-21 ENCOUNTER — Other Ambulatory Visit: Payer: Medicare Other

## 2013-01-21 DIAGNOSIS — B20 Human immunodeficiency virus [HIV] disease: Secondary | ICD-10-CM

## 2013-01-21 LAB — LIPID PANEL
HDL: 28 mg/dL — ABNORMAL LOW (ref >39)
Triglycerides: 116 mg/dL (ref <150)

## 2013-01-21 LAB — COMPLETE METABOLIC PANEL WITH GFR
AST: 21 U/L (ref 0–37)
BUN: 12 mg/dL (ref 6–23)
Calcium: 9.8 mg/dL (ref 8.4–10.5)
Chloride: 104 mEq/L (ref 96–112)
Creat: 1.03 mg/dL (ref 0.50–1.35)
GFR, Est African American: >89 mL/min
GFR, Est Non African American: 78 mL/min

## 2013-01-21 LAB — CBC WITH DIFFERENTIAL/PLATELET
Basophils Absolute: 0.1 10*3/uL (ref 0.0–0.1)
Lymphocytes Relative: 31 % (ref 12–46)
Neutro Abs: 6.2 10*3/uL (ref 1.7–7.7)
Platelets: 319 10*3/uL (ref 150–400)
RDW: 12.7 % (ref 11.5–15.5)
WBC: 10.2 10*3/uL (ref 4.0–10.5)

## 2013-01-21 LAB — RPR

## 2013-01-23 LAB — HIV-1 RNA QUANT-NO REFLEX-BLD: HIV 1 RNA Quant: <20 copies/mL (ref <20)

## 2013-02-04 ENCOUNTER — Encounter: Payer: Self-pay | Admitting: Infectious Disease

## 2013-02-04 ENCOUNTER — Ambulatory Visit (INDEPENDENT_AMBULATORY_CARE_PROVIDER_SITE_OTHER): Payer: Medicare Other | Admitting: Infectious Disease

## 2013-02-04 VITALS — BP 130/79 | HR 84 | Temp 98.0°F | Ht 69.0 in | Wt 175.5 lb

## 2013-02-04 DIAGNOSIS — R7309 Other abnormal glucose: Secondary | ICD-10-CM

## 2013-02-04 DIAGNOSIS — M8700 Idiopathic aseptic necrosis of unspecified bone: Secondary | ICD-10-CM

## 2013-02-04 DIAGNOSIS — B20 Human immunodeficiency virus [HIV] disease: Secondary | ICD-10-CM

## 2013-02-04 DIAGNOSIS — I10 Essential (primary) hypertension: Secondary | ICD-10-CM

## 2013-02-04 DIAGNOSIS — Z23 Encounter for immunization: Secondary | ICD-10-CM

## 2013-02-04 DIAGNOSIS — F17200 Nicotine dependence, unspecified, uncomplicated: Secondary | ICD-10-CM

## 2013-02-04 DIAGNOSIS — R739 Hyperglycemia, unspecified: Secondary | ICD-10-CM

## 2013-02-04 DIAGNOSIS — H409 Unspecified glaucoma: Secondary | ICD-10-CM | POA: Insufficient documentation

## 2013-02-04 DIAGNOSIS — E1065 Type 1 diabetes mellitus with hyperglycemia: Secondary | ICD-10-CM | POA: Insufficient documentation

## 2013-02-04 LAB — HEMOGLOBIN A1C: Mean Plasma Glucose: 131 mg/dL — ABNORMAL HIGH (ref <117)

## 2013-02-04 MED ORDER — CHOLECALCIFEROL 1.25 MG (50000 UT) PO CAPS: 50000.0000 [IU] | ORAL_CAPSULE | ORAL | Status: DC

## 2013-02-04 MED ORDER — ALENDRONATE SODIUM 70 MG PO TABS: 70.0000 mg | ORAL_TABLET | ORAL | Status: DC

## 2013-02-04 NOTE — Progress Notes (Signed)
  Subjective:    Patient ID: Wayne Santos, male    DOB: 03/10/1951, 62 y.o.   MRN: [id number]  HPI   Wayne Santos is a 62 y.o. male who is doing superbly well on his  antiviral regimen, Prezista Norvir Truvada with undetectable viral load and health cd4 count.   I have prescribed him Fosamax and high-dose vitamin D along with calcium he appears to be taking these but simply taking a multivitamin. He still does continue smoke. He was against using Chantix due to concern for possibility of suicide and depression. He has nicotine patches but has not yet started using them. He has lost weight over the last several months intentionally. He has had conflict with Dr. Mathews Robinsons office with regards to diagnosis of prediabetic state or diabetes.  Review of Systems  Constitutional: Negative for fever, chills, diaphoresis, activity change, appetite change, fatigue and unexpected weight change.  HENT: Negative for congestion, sore throat, rhinorrhea, sneezing, trouble swallowing and sinus pressure.   Eyes: Negative for photophobia and visual disturbance.  Respiratory: Negative for cough, chest tightness, shortness of breath, wheezing and stridor.   Cardiovascular: Negative for chest pain, palpitations and leg swelling.  Gastrointestinal: Negative for nausea, vomiting, abdominal pain, diarrhea, constipation, blood in stool, abdominal distention and anal bleeding.  Genitourinary: Negative for dysuria, hematuria, flank pain and difficulty urinating.  Musculoskeletal: Positive for back pain and arthralgias. Negative for myalgias, joint swelling and gait problem.  Skin: Negative for color change, pallor, rash and wound.  Neurological: Negative for dizziness, tremors, weakness and light-headedness.  Hematological: Negative for adenopathy. Does not bruise/bleed easily.  Psychiatric/Behavioral: Negative for behavioral problems, confusion, sleep disturbance, dysphoric mood, decreased concentration and agitation.        Objective:   Physical Exam  Constitutional: He is oriented to person, place, and time. He appears well-developed and well-nourished. No distress.  HENT:  Head: Normocephalic and atraumatic.  Mouth/Throat: Oropharynx is clear and moist. No oropharyngeal exudate.  Eyes: Conjunctivae and EOM are normal. Pupils are equal, round, and reactive to light. No scleral icterus.  Neck: Normal range of motion. Neck supple. No JVD present.  Cardiovascular: Normal rate, regular rhythm and normal heart sounds.  Exam reveals no gallop and no friction rub.   No murmur heard. Pulmonary/Chest: Effort normal and breath sounds normal. No respiratory distress. He has no wheezes. He has no rales. He exhibits no tenderness.  Abdominal: He exhibits no distension and no mass. There is no tenderness. There is no rebound and no guarding.  Musculoskeletal: He exhibits no edema and no tenderness.  Lymphadenopathy:    He has no cervical adenopathy.  Neurological: He is alert and oriented to person, place, and time. He exhibits normal muscle tone. Coordination normal.  Skin: Skin is warm and dry. He is not diaphoretic. No erythema. No pallor.  Psychiatric: He has a normal mood and affect. His behavior is normal. Judgment and thought content normal.          Assessment & Plan:  HIV: continue current regimen. I have no records of him having had ARV resistance in the past.   HTN: continue norvasc and fu with PCP  AVN: will need other hip replaced as well, START fosomax, vitamin d high dose and bring him back in 2 months to make sure he is taking this medicine correctly Take calcium 1000mg  daily  Smoking: continue to work on cessation  HCM: flu vaccine  Hyperglycemia: check A1c

## 2013-02-04 NOTE — Patient Instructions (Addendum)
I would like you to start 50,000 UNITS of Vitamin D once weekly and continue this for 8 weeks  Then I would like you to switch to   800 units of vitamin D daily  I would like you to take 500mg  of calcium supplment twice daily  I want you to take fosamax once weekly

## 2013-02-12 NOTE — Progress Notes (Signed)
Called and left patient a voice mail letting him know this office does not have diabetic teaching materials; and to please check with his PCP office, Dr. Renae Gloss. Advised him to call the clinic if this is not helpful. Wayne Santos

## 2013-02-19 ENCOUNTER — Other Ambulatory Visit: Payer: Self-pay | Admitting: Licensed Clinical Social Worker

## 2013-02-19 DIAGNOSIS — E785 Hyperlipidemia, unspecified: Secondary | ICD-10-CM

## 2013-02-19 MED ORDER — PRAVASTATIN SODIUM 40 MG PO TABS: 40.0000 mg | ORAL_TABLET | Freq: Every day | ORAL | Status: DC

## 2013-04-08 ENCOUNTER — Other Ambulatory Visit: Payer: Self-pay | Admitting: *Deleted

## 2013-04-08 ENCOUNTER — Telehealth: Payer: Self-pay | Admitting: *Deleted

## 2013-04-08 DIAGNOSIS — M8700 Idiopathic aseptic necrosis of unspecified bone: Secondary | ICD-10-CM

## 2013-04-08 MED ORDER — OYSTER SHELL CALCIUM 500 MG PO TABS: 500.0000 mg | ORAL_TABLET | Freq: Every day | ORAL | Status: DC

## 2013-04-08 MED ORDER — CHOLECALCIFEROL 1.25 MG (50000 UT) PO CAPS: 50000.0000 [IU] | ORAL_CAPSULE | ORAL | Status: DC

## 2013-04-08 MED ORDER — ALENDRONATE SODIUM 70 MG PO TABS: 70.0000 mg | ORAL_TABLET | ORAL | Status: DC

## 2013-04-08 MED ORDER — VITAMIN D (ERGOCALCIFEROL) 1.25 MG (50000 UNIT) PO CAPS: 50000.0000 [IU] | ORAL_CAPSULE | ORAL | Status: DC

## 2013-04-08 NOTE — Telephone Encounter (Signed)
Patient cancelled his 2 month f/u because he hasn't yet been able to start the fosomax and high dose vitamin D.  Patient will start those this week (resent rx to [facility] on [address] at his request).  Patient to keep his already scheduled 2/10 appointment, will have been taking the Fosomax and Vit D for 2 months by that time.  Andree Coss, RN

## 2013-04-09 ENCOUNTER — Ambulatory Visit: Payer: Medicare Other | Admitting: Infectious Disease

## 2013-04-15 ENCOUNTER — Ambulatory Visit: Payer: Medicare Other | Admitting: Infectious Disease

## 2013-06-04 ENCOUNTER — Other Ambulatory Visit: Payer: Medicare Other

## 2013-06-04 ENCOUNTER — Other Ambulatory Visit (HOSPITAL_COMMUNITY)
Admission: RE | Admit: 2013-06-04 | Discharge: 2013-06-04 | Disposition: A | Discharge status: Home / Self Care | Payer: Medicare Other | Source: Ambulatory Visit | Attending: Infectious Diseases | Admitting: Infectious Diseases

## 2013-06-04 DIAGNOSIS — B20 Human immunodeficiency virus [HIV] disease: Secondary | ICD-10-CM

## 2013-06-04 DIAGNOSIS — Z113 Encounter for screening for infections with a predominantly sexual mode of transmission: Secondary | ICD-10-CM | POA: Insufficient documentation

## 2013-06-04 LAB — CBC WITH DIFFERENTIAL/PLATELET
BASOS PCT: 1 % (ref 0–1)
Basophils Absolute: 0.1 10*3/uL (ref 0.0–0.1)
EOS ABS: 0.3 10*3/uL (ref 0.0–0.7)
EOS PCT: 4 % (ref 0–5)
HCT: 41.8 % (ref 39.0–52.0)
Hemoglobin: 14.4 g/dL (ref 13.0–17.0)
LYMPHS ABS: 2.5 10*3/uL (ref 0.7–4.0)
Lymphocytes Relative: 36 % (ref 12–46)
MCH: 31.4 pg (ref 26.0–34.0)
MCHC: 34.4 g/dL (ref 30.0–36.0)
MCV: 91.1 fL (ref 78.0–100.0)
Monocytes Absolute: 0.4 10*3/uL (ref 0.1–1.0)
Monocytes Relative: 6 % (ref 3–12)
NEUTROS PCT: 53 % (ref 43–77)
Neutro Abs: 3.9 10*3/uL (ref 1.7–7.7)
Platelets: 231 10*3/uL (ref 150–400)
RBC: 4.59 MIL/uL (ref 4.22–5.81)
RDW: 13.5 % (ref 11.5–15.5)
WBC: 7.1 10*3/uL (ref 4.0–10.5)

## 2013-06-05 LAB — COMPLETE METABOLIC PANEL WITH GFR
ALK PHOS: 82 U/L (ref 39–117)
ALT: 16 U/L (ref 0–53)
AST: 16 U/L (ref 0–37)
Albumin: 4.2 g/dL (ref 3.5–5.2)
BILIRUBIN TOTAL: 0.3 mg/dL (ref 0.3–1.2)
BUN: 14 mg/dL (ref 6–23)
CO2: 28 meq/L (ref 19–32)
CREATININE: 1.05 mg/dL (ref 0.50–1.35)
Calcium: 9.8 mg/dL (ref 8.4–10.5)
Chloride: 106 mEq/L (ref 96–112)
GFR, EST NON AFRICAN AMERICAN: 76 mL/min
GFR, Est African American: 88 mL/min
Glucose, Bld: 127 mg/dL — ABNORMAL HIGH (ref 70–99)
Potassium: 3.7 mEq/L (ref 3.5–5.3)
SODIUM: 142 meq/L (ref 135–145)
Total Protein: 6.8 g/dL (ref 6.0–8.3)

## 2013-06-06 LAB — HIV-1 RNA QUANT-NO REFLEX-BLD
HIV 1 RNA Quant: <20 copies/mL (ref <20)
HIV-1 RNA Quant, Log: <1.3 {Log} (ref <1.30)

## 2013-06-06 LAB — T-HELPER CELL (CD4) - (RCID CLINIC ONLY)
CD4 T CELL ABS: 840 /uL (ref 400–2700)
CD4 T CELL HELPER: 32 % — AB (ref 33–55)

## 2013-06-11 LAB — HLA B*5701: HLA-B*5701 w/rflx HLA-B High: NEGATIVE

## 2013-06-18 ENCOUNTER — Ambulatory Visit: Payer: Medicare Other | Admitting: Infectious Disease

## 2013-06-28 ENCOUNTER — Encounter: Payer: Self-pay | Admitting: Infectious Disease

## 2013-06-28 ENCOUNTER — Ambulatory Visit (INDEPENDENT_AMBULATORY_CARE_PROVIDER_SITE_OTHER): Payer: Medicare Other | Admitting: Infectious Disease

## 2013-06-28 VITALS — BP 146/92 | HR 75 | Temp 97.9°F | Wt 178.0 lb

## 2013-06-28 DIAGNOSIS — R7309 Other abnormal glucose: Secondary | ICD-10-CM

## 2013-06-28 DIAGNOSIS — B20 Human immunodeficiency virus [HIV] disease: Secondary | ICD-10-CM

## 2013-06-28 DIAGNOSIS — M8700 Idiopathic aseptic necrosis of unspecified bone: Secondary | ICD-10-CM

## 2013-06-28 DIAGNOSIS — R7303 Prediabetes: Secondary | ICD-10-CM

## 2013-06-28 DIAGNOSIS — I10 Essential (primary) hypertension: Secondary | ICD-10-CM

## 2013-06-28 NOTE — Progress Notes (Signed)
  Subjective:    Patient ID: Wayne Santos, male    DOB: January 25, 1951, 63 y.o.   MRN: [id number]  HPI   Wayne Santos is a 63 y.o. male who is doing superbly well on his  antiviral regimen, Prezista Norvir Truvada with undetectable viral load and health cd4 count.   His A1c came back at 6.2 which is nearly at the level WHO considers to make diagnosis of Diabetes.  He has gained further weight but wishes to try to lose weight first rather than possibly start on meds now if tested for A1c by us.     Review of Systems  Constitutional: Negative for fever, chills, diaphoresis, activity change, appetite change, fatigue and unexpected weight change.  HENT: Negative for congestion, rhinorrhea, sinus pressure, sneezing, sore throat and trouble swallowing.   Eyes: Negative for photophobia and visual disturbance.  Respiratory: Negative for cough, chest tightness, shortness of breath, wheezing and stridor.   Cardiovascular: Negative for chest pain, palpitations and leg swelling.  Gastrointestinal: Negative for nausea, vomiting, abdominal pain, diarrhea, constipation, blood in stool, abdominal distention and anal bleeding.  Genitourinary: Negative for dysuria, hematuria, flank pain and difficulty urinating.  Musculoskeletal: Negative for gait problem, joint swelling and myalgias.  Skin: Negative for color change, pallor, rash and wound.  Neurological: Negative for dizziness, tremors, weakness and light-headedness.  Hematological: Negative for adenopathy. Does not bruise/bleed easily.  Psychiatric/Behavioral: Negative for behavioral problems, confusion, sleep disturbance, dysphoric mood, decreased concentration and agitation.       Objective:   Physical Exam  Constitutional: He is oriented to person, place, and time. He appears well-developed and well-nourished. No distress.  HENT:  Head: Normocephalic and atraumatic.  Mouth/Throat: Oropharynx is clear and moist. No oropharyngeal exudate.  Eyes:  Conjunctivae and EOM are normal. Pupils are equal, round, and reactive to light. No scleral icterus.  Neck: Normal range of motion. Neck supple. No JVD present.  Cardiovascular: Normal rate, regular rhythm and normal heart sounds.  Exam reveals no gallop and no friction rub.   No murmur heard. Pulmonary/Chest: Effort normal and breath sounds normal. No respiratory distress. He has no wheezes. He has no rales. He exhibits no tenderness.  Abdominal: He exhibits no distension and no mass. There is no tenderness. There is no rebound and no guarding.  Musculoskeletal: He exhibits no edema and no tenderness.  Lymphadenopathy:    He has no cervical adenopathy.  Neurological: He is alert and oriented to person, place, and time. He exhibits normal muscle tone. Coordination normal.  Skin: Skin is warm and dry. He is not diaphoretic. No erythema. No pallor.  Psychiatric: He has a normal mood and affect. His behavior is normal. Judgment and thought content normal.          Assessment & Plan:  HIV: continue current regimen and simplify to PREZCOBIX and truvada when available      HTN: will likely need more aggressive bp   AVN: will need other hip replaced as well, He is supposed to be taking  fosomax, vitamin d high dose and  calcium 1000mg  daily  Smoking: continue to work on cessation  Prediabetes: if keeps gaining undoubtedly will be with DM and need metformin

## 2013-07-03 ENCOUNTER — Encounter: Payer: Self-pay | Admitting: *Deleted

## 2013-09-13 ENCOUNTER — Other Ambulatory Visit: Payer: Self-pay | Admitting: Licensed Clinical Social Worker

## 2013-09-13 DIAGNOSIS — B20 Human immunodeficiency virus [HIV] disease: Secondary | ICD-10-CM

## 2013-09-13 MED ORDER — DARUNAVIR ETHANOLATE 800 MG PO TABS: 800.0000 mg | ORAL_TABLET | Freq: Every day | ORAL | Status: DC

## 2013-09-13 MED ORDER — RITONAVIR 100 MG PO CAPS: 100.0000 mg | ORAL_CAPSULE | Freq: Every day | ORAL | Status: DC

## 2013-09-13 MED ORDER — EMTRICITABINE-TENOFOVIR DF 200-300 MG PO TABS: 1.0000 | ORAL_TABLET | Freq: Every day | ORAL | Status: DC

## 2013-10-06 ENCOUNTER — Ambulatory Visit (INDEPENDENT_AMBULATORY_CARE_PROVIDER_SITE_OTHER): Payer: Medicare Other | Admitting: Family Medicine

## 2013-10-06 ENCOUNTER — Ambulatory Visit: Payer: Medicare Other

## 2013-10-06 VITALS — BP 134/70 | HR 67 | Temp 97.7°F | Resp 18 | Ht 67.0 in | Wt 169.0 lb

## 2013-10-06 DIAGNOSIS — R059 Cough, unspecified: Secondary | ICD-10-CM

## 2013-10-06 DIAGNOSIS — R05 Cough: Secondary | ICD-10-CM

## 2013-10-06 DIAGNOSIS — R21 Rash and other nonspecific skin eruption: Secondary | ICD-10-CM

## 2013-10-06 MED ORDER — AZITHROMYCIN 250 MG PO TABS: ORAL_TABLET | ORAL | Status: DC

## 2013-10-06 MED ORDER — PREDNISONE 20 MG PO TABS: ORAL_TABLET | ORAL | Status: DC

## 2013-10-06 NOTE — Patient Instructions (Signed)

## 2013-10-06 NOTE — Progress Notes (Signed)
Subjective:    Patient ID: Wayne Santos, male    DOB: Aug 04, 1950, 63 y.o.   MRN: [id number]  URI  Associated symptoms include congestion, coughing and a rash. Pertinent negatives include no chest pain.  Cough Associated symptoms include a rash. Pertinent negatives include no chest pain.   Chief Complaint  Patient presents with   Establish Care   URI   Cough   This chart was scribed for Elvina SidleKurt Lauenstein, MD by Andrew Auaven Small, ED Scribe. This patient was seen in room 8 and the patient's care was started at 10:31 AM.  HPI Comments: Wayne BallsJerry Santos is a 63 y.o. male who presents to the [facility] complaining of red rash located on stomach, back, and  left bicep. Pt reports congestion, decreased appetite, and constipation. Pt reports he has not had BM in 3 days. Pt has h/o pneumonia  in the past 4 times. Pt denies CP. Pt reports he is smoker and is smoking 1 cigarette a day.   Pt is a Engineer, siteprivate landscaper.    Past Medical History  Diagnosis Date   Depression    HIV infection    AVN (avascular necrosis of bone)    Anemia    Anxiety    Glaucoma    Osteoporosis    Hypertension    Hyperlipidemia    Allergies  Allergen Reactions   Ace Inhibitors     COUGH   Prior to Admission medications   Medication Sig Start Date End Date Taking? Authorizing Provider  alendronate (FOSAMAX) 70 MG tablet Take 1 tablet (70 mg total) by mouth every 7 (seven) days. Take with a full glass of water on an empty stomach. 04/08/13  Yes Randall Hissornelius N Van Dam, MD  amLODipine [facility](NORVASC) 5 MG tablet  02/12/11  Yes Historical Provider, MD  aspirin 81 MG tablet Take 81 mg by mouth daily.     Yes Historical Provider, MD  Darunavir Ethanolate (PREZISTA) 800 MG tablet Take 1 tablet (800 mg total) by mouth daily with breakfast. 09/13/13  Yes Randall Hissornelius N Van Dam, MD  emtricitabine-tenofovir (TRUVADA) 200-300 MG per tablet Take 1 tablet by mouth daily. 09/13/13  Yes Randall Hissornelius N Van Dam, MD    mometasone (NASONEX) 50 MCG/ACT nasal spray 2 sprays by Nasal route daily.     Yes Historical Provider, MD  Ethelda Chickyster Shell Calcium (OYST-CAL) 500 MG TABS Take 0.4 tablets (500 mg total) by mouth daily. 04/08/13  Yes Randall Hissornelius N Van Dam, MD  pravastatin (PRAVACHOL) 40 MG tablet Take 1 tablet (40 mg total) by mouth daily. 02/19/13  Yes Randall Hissornelius N Van Dam, MD  ritonavir (NORVIR) 100 MG capsule Take 1 capsule (100 mg total) by mouth daily. 09/13/13  Yes Randall Hissornelius N Van Dam, MD  terbinafine (LAMISIL) 250 MG tablet TAKE ONE TABLET BY MOUTH EVERY DAY FOR 3 MONTHS. *AVOID ALCOHOL. 11/15/10  Yes Randall Hissornelius N Van Dam, MD  TRAVATAN Z 0.004 % SOLN ophthalmic solution  02/02/13  Yes Historical Provider, MD  Vitamin D, Ergocalciferol, (DRISDOL) 50000 UNITS CAPS capsule Take 1 capsule (50,000 Units total) by mouth every 7 (seven) days. 04/08/13  Yes Randall Hissornelius N Van Dam, MD   Review of Systems  Constitutional: Positive for appetite change.  HENT: Positive for congestion.   Respiratory: Positive for cough.   Cardiovascular: Negative for chest pain.  Gastrointestinal: Positive for constipation.  Skin: Positive for rash.       Objective:   Physical Exam  Nursing note and vitals reviewed. Constitutional: He  is oriented to person, place, and time. He appears well-developed and well-nourished. No distress.  HENT:  Head: Normocephalic and atraumatic.  Eyes: EOM are normal.  Neck: Neck supple. No tracheal deviation present.  Cardiovascular: Normal rate.   Pulmonary/Chest: Effort normal. No respiratory distress.  Musculoskeletal: Normal range of motion.  Neurological: He is alert and oriented to person, place, and time.  Skin: Skin is warm and dry.  Psychiatric: He has a normal mood and affect. His behavior is normal.   [facility] reading (PRIMARY) done by Dr. Lise Auer heavy markings without definite infiltrate, rounded density in left lung field consistent with blood vessel   Cough - Plan: DG Chest 2  View  Rash and nonspecific skin eruption Treatment with Z-Pak and prednisone with the suspicion that he has a mycoplasma infection causing the rash. Signed, Elvina Sidle, MD        Assessment & Plan:

## 2013-12-18 ENCOUNTER — Other Ambulatory Visit: Payer: Medicare Other

## 2013-12-18 DIAGNOSIS — B20 Human immunodeficiency virus [HIV] disease: Secondary | ICD-10-CM

## 2013-12-18 LAB — CBC WITH DIFFERENTIAL/PLATELET
Basophils Absolute: 0.1 10*3/uL (ref 0.0–0.1)
Basophils Relative: 1 % (ref 0–1)
EOS PCT: 4 % (ref 0–5)
Eosinophils Absolute: 0.3 10*3/uL (ref 0.0–0.7)
HEMATOCRIT: 40.9 % (ref 39.0–52.0)
Hemoglobin: 14.1 g/dL (ref 13.0–17.0)
LYMPHS ABS: 3 10*3/uL (ref 0.7–4.0)
LYMPHS PCT: 39 % (ref 12–46)
MCH: 30.8 pg (ref 26.0–34.0)
MCHC: 34.5 g/dL (ref 30.0–36.0)
MCV: 89.3 fL (ref 78.0–100.0)
MONO ABS: 0.4 10*3/uL (ref 0.1–1.0)
MONOS PCT: 5 % (ref 3–12)
Neutro Abs: 3.9 10*3/uL (ref 1.7–7.7)
Neutrophils Relative %: 51 % (ref 43–77)
Platelets: 254 10*3/uL (ref 150–400)
RBC: 4.58 MIL/uL (ref 4.22–5.81)
RDW: 13.8 % (ref 11.5–15.5)
WBC: 7.7 10*3/uL (ref 4.0–10.5)

## 2013-12-19 LAB — COMPLETE METABOLIC PANEL WITH GFR
ALT: 13 U/L (ref 0–53)
AST: 15 U/L (ref 0–37)
Albumin: 4.4 g/dL (ref 3.5–5.2)
Alkaline Phosphatase: 105 U/L (ref 39–117)
BUN: 9 mg/dL (ref 6–23)
CALCIUM: 9.4 mg/dL (ref 8.4–10.5)
CHLORIDE: 105 meq/L (ref 96–112)
CO2: 27 mEq/L (ref 19–32)
Creat: 1.01 mg/dL (ref 0.50–1.35)
GFR, Est Non African American: 79 mL/min
Glucose, Bld: 83 mg/dL (ref 70–99)
Potassium: 3.5 mEq/L (ref 3.5–5.3)
Sodium: 141 mEq/L (ref 135–145)
Total Bilirubin: 0.4 mg/dL (ref 0.2–1.2)
Total Protein: 6.9 g/dL (ref 6.0–8.3)

## 2013-12-19 LAB — HEMOGLOBIN A1C
HEMOGLOBIN A1C: 6.3 % — AB (ref <5.7)
Mean Plasma Glucose: 134 mg/dL — ABNORMAL HIGH (ref <117)

## 2013-12-19 LAB — RPR

## 2013-12-19 LAB — MICROALBUMIN / CREATININE URINE RATIO
CREATININE, URINE: 127.1 mg/dL
MICROALB/CREAT RATIO: 3.9 mg/g (ref 0.0–30.0)
Microalb, Ur: 0.5 mg/dL (ref 0.00–1.89)

## 2013-12-19 LAB — LIPID PANEL
Cholesterol: 159 mg/dL (ref 0–200)
HDL: 32 mg/dL — ABNORMAL LOW (ref >39)
LDL CALC: 79 mg/dL (ref 0–99)
Total CHOL/HDL Ratio: 5 Ratio
Triglycerides: 238 mg/dL — ABNORMAL HIGH (ref <150)
VLDL: 48 mg/dL — ABNORMAL HIGH (ref 0–40)

## 2013-12-20 LAB — T-HELPER CELL (CD4) - (RCID CLINIC ONLY)
CD4 % Helper T Cell: 352 % — ABNORMAL HIGH (ref 33–55)
CD4 T Cell Abs: 990 /uL (ref 400–2700)

## 2013-12-21 LAB — HIV-1 RNA QUANT-NO REFLEX-BLD
HIV 1 RNA Quant: <20 copies/mL (ref <20)
HIV-1 RNA Quant, Log: <1.3 {Log} (ref <1.30)

## 2014-01-01 ENCOUNTER — Encounter: Payer: Self-pay | Admitting: Infectious Disease

## 2014-01-01 ENCOUNTER — Ambulatory Visit (INDEPENDENT_AMBULATORY_CARE_PROVIDER_SITE_OTHER): Payer: Medicare Other | Admitting: Infectious Disease

## 2014-01-01 VITALS — BP 148/92 | HR 77 | Temp 97.9°F | Wt 176.0 lb

## 2014-01-01 DIAGNOSIS — R7303 Prediabetes: Secondary | ICD-10-CM

## 2014-01-01 DIAGNOSIS — Z23 Encounter for immunization: Secondary | ICD-10-CM

## 2014-01-01 DIAGNOSIS — R7309 Other abnormal glucose: Secondary | ICD-10-CM

## 2014-01-01 DIAGNOSIS — M8700 Idiopathic aseptic necrosis of unspecified bone: Secondary | ICD-10-CM

## 2014-01-01 DIAGNOSIS — B20 Human immunodeficiency virus [HIV] disease: Secondary | ICD-10-CM

## 2014-01-01 MED ORDER — ALENDRONATE SODIUM 70 MG PO TABS: 70.0000 mg | ORAL_TABLET | ORAL | Status: DC

## 2014-01-01 NOTE — Progress Notes (Signed)
Subjective:    Patient ID: Wayne Santos, male    DOB: 07-Feb-1951, 63 y.o.   MRN: [id number]  HPI   Wayne Santos is a 63 y.o. male who is doing superbly well on his  antiviral regimen, Prezista Norvir Truvada with undetectable viral load and health cd4 count.   His A1c came back at 6.2 which is nearly at the level WHO considers to make diagnosis of Diabetes.  He has gained further weight but wishes to try to lose weight first rather than possibly start on meds now if tested for A1c by [place].   We also discussed simplifying him to PREZCOBIX and Truvada at that visit and again today but he preferred to stay on the current regimen    Review of Systems  Constitutional: Negative for fever, chills, diaphoresis, activity change, appetite change, fatigue and unexpected weight change.  HENT: Negative for congestion, rhinorrhea, sinus pressure, sneezing, sore throat and trouble swallowing.   Eyes: Negative for photophobia and visual disturbance.  Respiratory: Negative for cough, chest tightness, shortness of breath, wheezing and stridor.   Cardiovascular: Negative for chest pain, palpitations and leg swelling.  Gastrointestinal: Negative for nausea, vomiting, abdominal pain, diarrhea, constipation, blood in stool, abdominal distention and anal bleeding.  Genitourinary: Negative for dysuria, hematuria, flank pain and difficulty urinating.  Musculoskeletal: Negative for gait problem, joint swelling and myalgias.  Skin: Negative for color change, pallor, rash and wound.  Neurological: Negative for dizziness, tremors, weakness and light-headedness.  Hematological: Negative for adenopathy. Does not bruise/bleed easily.  Psychiatric/Behavioral: Negative for behavioral problems, confusion, sleep disturbance, dysphoric mood, decreased concentration and agitation.       Objective:   Physical Exam  Constitutional: He is oriented to person, place, and time. He appears well-developed and well-nourished.  No distress.  HENT:  Head: Normocephalic and atraumatic.  Mouth/Throat: Oropharynx is clear and moist. No oropharyngeal exudate.  Eyes: Conjunctivae and EOM are normal. Pupils are equal, round, and reactive to light. No scleral icterus.  Neck: Normal range of motion. Neck supple. No JVD present.  Cardiovascular: Normal rate, regular rhythm and normal heart sounds.  Exam reveals no gallop and no friction rub.   No murmur heard. Pulmonary/Chest: Effort normal and breath sounds normal. No respiratory distress. He has no wheezes. He has no rales. He exhibits no tenderness.  Abdominal: He exhibits no distension and no mass. There is no tenderness. There is no rebound and no guarding.  Musculoskeletal: He exhibits no edema and no tenderness.  Lymphadenopathy:    He has no cervical adenopathy.  Neurological: He is alert and oriented to person, place, and time. He exhibits normal muscle tone. Coordination normal.  Skin: Skin is warm and dry. He is not diaphoretic. No erythema. No pallor.  Psychiatric: He has a normal mood and affect. His behavior is normal. Judgment and thought content normal.          Assessment & Plan:  HIV: continue current regimen and simplify to PREZCOBIX and truvada when he feels this is reasonable for now would like to continue his current regimen   HTN: will likely need more aggressive bp   AVN: will need other hip replaced as well, He is supposed to be taking  fosomax, vitamin d high dose and  calcium  daily. I refilled his Fosamax today  Smoking: Spent greater than 5 minutes talking about smoking cessation and encouraging him to stop. Is not interested in Chantix to the concerns about suicidal ideation. He will consider patches  Prediabetes: if keeps gaining undoubtedly will be with DM and need metformin, A1c at next visit

## 2014-02-04 ENCOUNTER — Other Ambulatory Visit: Payer: Self-pay | Admitting: Licensed Clinical Social Worker

## 2014-02-04 MED ORDER — AMLODIPINE BESYLATE 5 MG PO TABS: 5.0000 mg | ORAL_TABLET | Freq: Every day | ORAL | Status: DC

## 2014-02-04 NOTE — Telephone Encounter (Signed)
Patient's primary physician  Dr. Renae GlossShelton has  left the practice and the patient is in the process of looking for another pcp, he is currently out of blood pressure medication and asked if we could call it in for him. I called in 1 month supply until he finds another pcp.

## 2014-02-11 ENCOUNTER — Other Ambulatory Visit: Payer: Self-pay | Admitting: *Deleted

## 2014-02-11 DIAGNOSIS — E785 Hyperlipidemia, unspecified: Secondary | ICD-10-CM

## 2014-02-11 MED ORDER — PRAVASTATIN SODIUM 40 MG PO TABS: 40.0000 mg | ORAL_TABLET | Freq: Every day | ORAL | Status: DC

## 2014-02-21 ENCOUNTER — Other Ambulatory Visit: Payer: Self-pay

## 2014-03-06 ENCOUNTER — Ambulatory Visit (INDEPENDENT_AMBULATORY_CARE_PROVIDER_SITE_OTHER): Payer: Medicare Other | Admitting: Family Medicine

## 2014-03-06 VITALS — BP 130/72 | HR 76 | Temp 98.1°F | Resp 18 | Ht 67.0 in | Wt 178.0 lb

## 2014-03-06 DIAGNOSIS — N368 Other specified disorders of urethra: Secondary | ICD-10-CM

## 2014-03-06 DIAGNOSIS — B20 Human immunodeficiency virus [HIV] disease: Secondary | ICD-10-CM

## 2014-03-06 DIAGNOSIS — M545 Low back pain, unspecified: Secondary | ICD-10-CM

## 2014-03-06 DIAGNOSIS — S39012A Strain of muscle, fascia and tendon of lower back, initial encounter: Secondary | ICD-10-CM

## 2014-03-06 DIAGNOSIS — R310 Gross hematuria: Secondary | ICD-10-CM

## 2014-03-06 LAB — POCT CBC
GRANULOCYTE PERCENT: 58 % (ref 37–80)
HEMATOCRIT: 45.9 % (ref 43.5–53.7)
Hemoglobin: 14.4 g/dL (ref 14.1–18.1)
LYMPH, POC: 2.7 (ref 0.6–3.4)
MCH, POC: 30.4 pg (ref 27–31.2)
MCHC: 31.3 g/dL — AB (ref 31.8–35.4)
MCV: 97.1 fL — AB (ref 80–97)
MID (cbc): 0.3 (ref 0–0.9)
MPV: 6.9 fL (ref 0–99.8)
POC GRANULOCYTE: 4.1 (ref 2–6.9)
POC LYMPH PERCENT: 37.9 %L (ref 10–50)
POC MID %: 4.1 % (ref 0–12)
Platelet Count, POC: 253 10*3/uL (ref 142–424)
RBC: 4.72 M/uL (ref 4.69–6.13)
RDW, POC: 14.4 %
WBC: 7.1 10*3/uL (ref 4.6–10.2)

## 2014-03-06 LAB — POCT URINALYSIS DIPSTICK
BILIRUBIN UA: NEGATIVE
Glucose, UA: NEGATIVE
Ketones, UA: NEGATIVE
LEUKOCYTES UA: NEGATIVE
NITRITE UA: NEGATIVE
PROTEIN UA: NEGATIVE
Spec Grav, UA: 1.015
Urobilinogen, UA: 1
pH, UA: 6.5

## 2014-03-06 LAB — POCT UA - MICROSCOPIC ONLY
Casts, Ur, LPF, POC: NEGATIVE
Crystals, Ur, HPF, POC: NEGATIVE
Mucus, UA: NEGATIVE
YEAST UA: NEGATIVE

## 2014-03-06 MED ORDER — CIPROFLOXACIN HCL 500 MG PO TABS: 500.0000 mg | ORAL_TABLET | Freq: Two times a day (BID) | ORAL | Status: DC

## 2014-03-06 MED ORDER — METHOCARBAMOL 500 MG PO TABS: ORAL_TABLET | ORAL | Status: DC

## 2014-03-06 NOTE — Patient Instructions (Addendum)
Drink plenty of fluids to keep the urethral well flushed out.  No sexual activity to avoid any trauma to the penis  We will work on making a referral for tomorrow to the urologist.  If abruptly worse [facility]  Take a muscle relaxant 4 times daily if needed for your back. In addition to that you can take Aleve 2 tablets twice daily as needed for pain.  If symptoms continue to persist please return.
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
## 2014-03-08 LAB — GC/CHLAMYDIA PROBE AMP
CT Probe RNA: NEGATIVE
GC Probe RNA: NEGATIVE

## 2014-04-14 ENCOUNTER — Other Ambulatory Visit: Payer: Self-pay | Admitting: *Deleted

## 2014-04-14 DIAGNOSIS — I10 Essential (primary) hypertension: Secondary | ICD-10-CM

## 2014-04-14 MED ORDER — AMLODIPINE BESYLATE 5 MG PO TABS: 5.0000 mg | ORAL_TABLET | Freq: Every day | ORAL | Status: DC

## 2014-04-16 ENCOUNTER — Other Ambulatory Visit: Payer: Medicare Other

## 2014-04-23 ENCOUNTER — Ambulatory Visit (INDEPENDENT_AMBULATORY_CARE_PROVIDER_SITE_OTHER): Payer: Medicare Other | Admitting: Family Medicine

## 2014-04-23 VITALS — BP 124/70 | HR 79 | Temp 97.8°F | Resp 20 | Ht 67.0 in | Wt 178.2 lb

## 2014-04-23 DIAGNOSIS — B029 Zoster without complications: Secondary | ICD-10-CM

## 2014-04-23 DIAGNOSIS — B20 Human immunodeficiency virus [HIV] disease: Secondary | ICD-10-CM

## 2014-04-23 DIAGNOSIS — R109 Unspecified abdominal pain: Secondary | ICD-10-CM

## 2014-04-23 DIAGNOSIS — R112 Nausea with vomiting, unspecified: Secondary | ICD-10-CM

## 2014-04-23 MED ORDER — OXYCODONE-ACETAMINOPHEN 5-325 MG PO TABS: 1.0000 | ORAL_TABLET | Freq: Three times a day (TID) | ORAL | Status: DC | PRN

## 2014-04-23 MED ORDER — VALACYCLOVIR HCL 1 G PO TABS: 1000.0000 mg | ORAL_TABLET | Freq: Three times a day (TID) | ORAL | Status: DC

## 2014-04-23 NOTE — Progress Notes (Signed)
Subjective:  This chart was scribed for Wayne Staggers, MD by Lionel December, Medical Scribe. This patient was seen in Room 1 and the patient's care was started 8:04pm.    Patient ID: Wayne Santos, male    DOB: 1950/09/27, 63 y.o.   MRN: [id number]  Rash Associated symptoms include vomiting. Pertinent negatives include no diarrhea or fever.    HPI Comments: Wayne Santos is a 63 y.o. male who presents to the [facility] complaining of a painful, sore rash that is spreading across his left flank.  Pain started 3 days ago and did not notice a rash until this morning when he was getting out of the shower. Pain feels like it is pulsating and moving. Patient feels nauseous currently, and has vomitted twice this morning. He denies loss in appetite, fever, diarrhea, and hematochezia . He occasionally drinks alcohol and his last drink was in October. Patient had a zoster vaccine in the spring of this year.    He has a history of HIV and is followed by Dr. Algis Liming .He has an appointment with him at the end of this month.  His HIV viral RNA was undetectable and his most recent CD4 count was 990. He Has taken Vicodin or percocet in the past and does not have a history of narcotic abuse or intolerance.  Takes alleve if needed.      Patient Active Problem List   Diagnosis Date Noted  . Hyperglycemia 02/04/2013  . AVN (avascular necrosis of bone)   . Glaucoma   . Hyperlipidemia 03/16/2011  . ONYCHOMYCOSIS, TOENAILS 03/24/2010  . SMOKER 03/24/2010  . 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 POSTOPERATIVE INFECTION 07/10/2008  . POSTNASAL DRIP SYNDROME 12/13/2007  . URINARY TRACT INFECTION SITE NOT SPECIFIED 04/04/2007  . BOILS, RECURRENT 11/14/2006  . HIV disease 09/12/2006  . PANIC ATTACK 09/12/2006  . HYPERTENSION 09/12/2006  . AVASCULAR NECROSIS 09/12/2006   Past Medical History  Diagnosis Date  . Depression   . HIV infection   . AVN (avascular necrosis of bone)   . Anemia   . Anxiety    . Glaucoma   . Osteoporosis   . Hypertension   . Hyperlipidemia    Past Surgical History  Procedure Laterality Date  . Joint replacement    . Total hip arthroplasty     Allergies  Allergen Reactions  . Ace Inhibitors     COUGH   Prior to Admission medications   Medication Sig Start Date End Date Taking? Authorizing Provider  alendronate (FOSAMAX) 70 MG tablet Take 1 tablet (70 mg total) by mouth every 7 (seven) days. Take with a full glass of water on an empty stomach. 01/01/14  Yes Randall Hiss, MD  amLODipine (NORVASC) 5 MG tablet Take 1 tablet (5 mg total) by mouth daily. 04/14/14  Yes Randall Hiss, MD  aspirin 81 MG tablet Take 81 mg by mouth daily.     Yes Historical Provider, MD  Darunavir Ethanolate (PREZISTA) 800 MG tablet Take 1 tablet (800 mg total) by mouth daily with breakfast. 09/13/13  Yes Randall Hiss, MD  emtricitabine-tenofovir (TRUVADA) 200-300 MG per tablet Take 1 tablet by mouth daily. 09/13/13  Yes Randall Hiss, MD  methocarbamol (ROBAXIN) 500 MG tablet Take 1 tablet 4 times daily as needed for muscle relaxant for back 03/06/14  Yes Peyton Najjar, MD  mometasone (NASONEX) 50 MCG/ACT nasal spray 2 sprays by Nasal route daily.  Yes Historical Provider, MD  pravastatin (PRAVACHOL) 40 MG tablet Take 1 tablet (40 mg total) by mouth daily. 02/11/14  Yes Randall Hiss, MD  ritonavir (NORVIR) 100 MG capsule Take 1 capsule (100 mg total) by mouth daily. 09/13/13  Yes Randall Hiss, MD  terbinafine (LAMISIL) 250 MG tablet TAKE ONE TABLET BY MOUTH EVERY DAY FOR 3 MONTHS. *AVOID ALCOHOL. 11/15/10  Yes Randall Hiss, MD  TRAVATAN Z 0.004 % SOLN ophthalmic solution  02/02/13  Yes Historical Provider, MD  Vitamin D, Ergocalciferol, (DRISDOL) 50000 UNITS CAPS capsule Take 1 capsule (50,000 Units total) by mouth every 7 (seven) days. 04/08/13  Yes Randall Hiss, MD  Oyster Shell Calcium (OYST-CAL) 500 MG TABS Take 0.4 tablets (500 mg  total) by mouth daily. Patient not taking: Reported on 04/23/2014 04/08/13   Randall Hiss, MD   History   Social History  . Marital Status: Single    Spouse Name: N/A    Number of Children: N/A  . Years of Education: N/A   Occupational History  . Not on file.   Social History Main Topics  . Smoking status: Current Every Day Smoker -- 0.50 packs/day    Types: Cigarettes  . Smokeless tobacco: Not on file  . Alcohol Use: No  . Drug Use: No  . Sexual Activity: Yes     Comment: declined condoms   Other Topics Concern  . Not on file   Social History Narrative     Review of Systems  Constitutional: Positive for appetite change. Negative for fever and chills.  Gastrointestinal: Positive for nausea and vomiting. Negative for diarrhea and blood in stool.  Genitourinary: Positive for flank pain.  Skin: Positive for rash.       Objective:   Physical Exam  Constitutional: He is oriented to person, place, and time. He appears well-developed and well-nourished.  HENT:  Head: Normocephalic and atraumatic.  Eyes: EOM are normal. Pupils are equal, round, and reactive to light.  Neck: No JVD present. Carotid bruit is not present.  Cardiovascular: Normal rate, regular rhythm and normal heart sounds.   No murmur heard. Pulmonary/Chest: Effort normal and breath sounds normal. He has no rales.  Abdominal: Bowel sounds are normal. He exhibits no distension. There is no tenderness. There is no CVA tenderness, no tenderness at McBurney's point and negative Murphy's sign.  Tender along the left upper quadrant along his skin Area of multiple patches of vesciles and erythema that do not cross midline   Musculoskeletal: He exhibits no edema.  Neurological: He is alert and oriented to person, place, and time.  Skin: Skin is warm and dry. Rash noted.  Left upper abd to left plan and left mid back there are multiple erythemas patches with diffuse tenderness in involved area with light  touch   Psychiatric: He has a normal mood and affect.  Vitals reviewed.    Filed Vitals:   04/23/14 1935  BP: 124/70  Pulse: 79  Temp: 97.8 F (36.6 C)  TempSrc: Oral  Resp: 20  Height: 5\' 7"  (1.702 m)  Weight: 178 lb 3.2 oz (80.831 kg)  SpO2: 97%       Assessment & Plan:   Wayne Santos is a 63 y.o. male Herpes zoster - Plan: valACYclovir (VALTREX) 1000 MG tablet, oxyCODONE-acetaminophen (ROXICET) 5-325 MG per tablet  Non-intractable vomiting with nausea, vomiting of unspecified type  HIV disease  Abdominal wall pain - Plan: oxyCODONE-acetaminophen (ROXICET) 5-325 MG  per tablet  Suspected initial pain 4 days ago from L sided zoster, with rash presentation today.  Hx of HIV with recent CD4 levels noted and undetectable viral/RNA. Has had Zostavax as well.   -Start Valtrex 1 gram TID - discussed we may be out of ideal therapeutic window at this point, but usually minimal side effects - may be worth trying to lessen chance of PHN devt.   -alleve if needed, or for more severe pain - percocet Rx given as possible conflict with hydrocodone and Ritonavir. SED.   -abdomen nontender except in skin area - deferred further testing for n/v this am, and suspect some of this may be form pain with shingles, but if N/V persists tomorrow, or worsens overnight - recommended repeat eval here or in ER.   (ER/RTC precautions discussed).   Meds ordered this encounter  Medications  . valACYclovir (VALTREX) 1000 MG tablet    Sig: Take 1 tablet (1,000 mg total) by mouth 3 (three) times daily.    Dispense:  21 tablet    Refill:  0  . oxyCODONE-acetaminophen (ROXICET) 5-325 MG per tablet    Sig: Take 1 tablet by mouth every 8 (eight) hours as needed for severe pain.    Dispense:  20 tablet    Refill:  0   Patient Instructions  Start the Valtrex tonight. AS we discussed, we may be beyond the time where this medicine may be most effective in treatment of shingles. If needed for pain - I did  write you for percocet up to every 8 hours (there was a contraindication with vicodin and one of your usual medications). Do not drive or operate heavy machinery if you are taking this medicine. Return to the clinic or go to the nearest emergency room if any of your symptoms worsen or new symptoms occur. Shingles Shingles (herpes zoster) is an infection that is caused by the same virus that causes chickenpox (varicella). The infection causes a painful skin rash and fluid-filled blisters, which eventually break open, crust over, and heal. It may occur in any area of the body, but it usually affects only one side of the body or face. The pain of shingles usually lasts about 1 month. However, some people with shingles may develop long-term (chronic) pain in the affected area of the body. Shingles often occurs many years after the person had chickenpox. It is more common:  In people older than 50 years.  In people with weakened immune systems, such as those with HIV, AIDS, or cancer.  In people taking medicines that weaken the immune system, such as transplant medicines.  In people under great stress. CAUSES  Shingles is caused by the varicella zoster virus (VZV), which also causes chickenpox. After a person is infected with the virus, it can remain in the person's body for years in an inactive state (dormant). To cause shingles, the virus reactivates and breaks out as an infection in a nerve root. The virus can be spread from person to person (contagious) through contact with open blisters of the shingles rash. It will only spread to people who have not had chickenpox. When these people are exposed to the virus, they may develop chickenpox. They will not develop shingles. Once the blisters scab over, the person is no longer contagious and cannot spread the virus to others. SIGNS AND SYMPTOMS  Shingles shows up in stages. The initial symptoms may be pain, itching, and tingling in an area of the skin.  This pain is  usually described as burning, stabbing, or throbbing.In a few days or weeks, a painful red rash will appear in the area where the pain, itching, and tingling were felt. The rash is usually on one side of the body in a band or belt-like pattern. Then, the rash usually turns into fluid-filled blisters. They will scab over and dry up in approximately 2-3 weeks. Flu-like symptoms may also occur with the initial symptoms, the rash, or the blisters. These may include:  Fever.  Chills.  Headache.  Upset stomach. DIAGNOSIS  Your health care provider will perform a skin exam to diagnose shingles. Skin scrapings or fluid samples may also be taken from the blisters. This sample will be examined under a microscope or sent to a lab for further testing. TREATMENT  There is no specific cure for shingles. Your health care provider will likely prescribe medicines to help you manage the pain, recover faster, and avoid long-term problems. This may include antiviral drugs, anti-inflammatory drugs, and pain medicines. HOME CARE INSTRUCTIONS   Take a cool bath or apply cool compresses to the area of the rash or blisters as directed. This may help with the pain and itching.   Take medicines only as directed by your health care provider.   Rest as directed by your health care provider.  Keep your rash and blisters clean with mild soap and cool water or as directed by your health care provider.  Do not pick your blisters or scratch your rash. Apply an anti-itch cream or numbing creams to the affected area as directed by your health care provider.  Keep your shingles rash covered with a loose bandage (dressing).  Avoid skin contact with:  Babies.   Pregnant women.   Children with eczema.   Elderly people with transplants.   People with chronic illnesses, such as leukemia or AIDS.   Wear loose-fitting clothing to help ease the pain of material rubbing against the rash.  Keep all  follow-up visits as directed by your health care provider.If the area involved is on your face, you may receive a referral for a specialist, such as an eye doctor (ophthalmologist) or an ear, nose, and throat (ENT) doctor. Keeping all follow-up visits will help you avoid eye problems, chronic pain, or disability.  SEEK IMMEDIATE MEDICAL CARE IF:   You have facial pain, pain around the eye area, or loss of feeling on one side of your face.  You have ear pain or ringing in your ear.  You have loss of taste.  Your pain is not relieved with prescribed medicines.   Your redness or swelling spreads.   You have more pain and swelling.  Your condition is worsening or has changed.   You have a fever. MAKE SURE YOU:  Understand these instructions.  Will watch your condition.  Will get help right away if you are not doing well or get worse. Document Released: 04/25/2005 Document Revised: 09/09/2013 Document Reviewed: 12/08/2011 [facility] Patient Information 2015 [employer] [place]. This information is not intended to replace advice given to you by your health care provider. Make sure you discuss any questions you have with your health care provider.     I personally performed the services described in this documentation, which was scribed in my presence. The recorded information has been reviewed and considered, and addended by me as needed.

## 2014-04-23 NOTE — Patient Instructions (Signed)
Start the Valtrex tonight. AS we discussed, we may be beyond the time where this medicine may be most effective in treatment of shingles. If needed for pain - I did write you for percocet up to every 8 hours (there was a contraindication with vicodin and one of your usual medications). Do not drive or operate heavy machinery if you are taking this medicine. Return to the clinic or go to the nearest emergency room if any of your symptoms worsen or new symptoms occur. Shingles Shingles (herpes zoster) is an infection that is caused by the same virus that causes chickenpox (varicella). The infection causes a painful skin rash and fluid-filled blisters, which eventually break open, crust over, and heal. It may occur in any area of the body, but it usually affects only one side of the body or face. The pain of shingles usually lasts about 1 month. However, some people with shingles may develop long-term (chronic) pain in the affected area of the body. Shingles often occurs many years after the person had chickenpox. It is more common:  In people older than 50 years.  In people with weakened immune systems, such as those with HIV, AIDS, or cancer.  In people taking medicines that weaken the immune system, such as transplant medicines.  In people under great stress. CAUSES  Shingles is caused by the varicella zoster virus (VZV), which also causes chickenpox. After a person is infected with the virus, it can remain in the person's body for years in an inactive state (dormant). To cause shingles, the virus reactivates and breaks out as an infection in a nerve root. The virus can be spread from person to person (contagious) through contact with open blisters of the shingles rash. It will only spread to people who have not had chickenpox. When these people are exposed to the virus, they may develop chickenpox. They will not develop shingles. Once the blisters scab over, the person is no longer contagious and  cannot spread the virus to others. SIGNS AND SYMPTOMS  Shingles shows up in stages. The initial symptoms may be pain, itching, and tingling in an area of the skin. This pain is usually described as burning, stabbing, or throbbing.In a few days or weeks, a painful red rash will appear in the area where the pain, itching, and tingling were felt. The rash is usually on one side of the body in a band or belt-like pattern. Then, the rash usually turns into fluid-filled blisters. They will scab over and dry up in approximately 2-3 weeks. Flu-like symptoms may also occur with the initial symptoms, the rash, or the blisters. These may include:  Fever.  Chills.  Headache.  Upset stomach. DIAGNOSIS  Your health care provider will perform a skin exam to diagnose shingles. Skin scrapings or fluid samples may also be taken from the blisters. This sample will be examined under a microscope or sent to a lab for further testing. TREATMENT  There is no specific cure for shingles. Your health care provider will likely prescribe medicines to help you manage the pain, recover faster, and avoid long-term problems. This may include antiviral drugs, anti-inflammatory drugs, and pain medicines. HOME CARE INSTRUCTIONS   Take a cool bath or apply cool compresses to the area of the rash or blisters as directed. This may help with the pain and itching.   Take medicines only as directed by your health care provider.   Rest as directed by your health care provider.  Keep your rash and  blisters clean with mild soap and cool water or as directed by your health care provider.  Do not pick your blisters or scratch your rash. Apply an anti-itch cream or numbing creams to the affected area as directed by your health care provider.  Keep your shingles rash covered with a loose bandage (dressing).  Avoid skin contact with:  Babies.   Pregnant women.   Children with eczema.   Elderly people with transplants.    People with chronic illnesses, such as leukemia or AIDS.   Wear loose-fitting clothing to help ease the pain of material rubbing against the rash.  Keep all follow-up visits as directed by your health care provider.If the area involved is on your face, you may receive a referral for a specialist, such as an eye doctor (ophthalmologist) or an ear, nose, and throat (ENT) doctor. Keeping all follow-up visits will help you avoid eye problems, chronic pain, or disability.  SEEK IMMEDIATE MEDICAL CARE IF:   You have facial pain, pain around the eye area, or loss of feeling on one side of your face.  You have ear pain or ringing in your ear.  You have loss of taste.  Your pain is not relieved with prescribed medicines.   Your redness or swelling spreads.   You have more pain and swelling.  Your condition is worsening or has changed.   You have a fever. MAKE SURE YOU:  Understand these instructions.  Will watch your condition.  Will get help right away if you are not doing well or get worse. Document Released: 04/25/2005 Document Revised: 09/09/2013 Document Reviewed: 12/08/2011 [facility] Patient Information 2015 [place], [place]. This information is not intended to replace advice given to you by your health care provider. Make sure you discuss any questions you have with your health care provider.

## 2014-04-30 ENCOUNTER — Other Ambulatory Visit: Payer: Medicare Other

## 2014-04-30 ENCOUNTER — Other Ambulatory Visit: Payer: Medicare Other | Admitting: Infectious Disease

## 2014-04-30 DIAGNOSIS — Z79899 Other long term (current) drug therapy: Secondary | ICD-10-CM

## 2014-04-30 LAB — LIPID PANEL
CHOL/HDL RATIO: 4.7 ratio
CHOLESTEROL: 142 mg/dL (ref 0–200)
HDL: 30 mg/dL — ABNORMAL LOW (ref >39)
LDL Cholesterol: 87 mg/dL (ref 0–99)
TRIGLYCERIDES: 127 mg/dL (ref <150)
VLDL: 25 mg/dL (ref 0–40)

## 2014-04-30 LAB — CBC WITH DIFFERENTIAL/PLATELET
Basophils Absolute: 0.1 10*3/uL (ref 0.0–0.1)
Basophils Relative: 1 % (ref 0–1)
EOS PCT: 5 % (ref 0–5)
Eosinophils Absolute: 0.4 10*3/uL (ref 0.0–0.7)
HCT: 45.5 % (ref 39.0–52.0)
HEMOGLOBIN: 15.5 g/dL (ref 13.0–17.0)
LYMPHS ABS: 3.3 10*3/uL (ref 0.7–4.0)
Lymphocytes Relative: 45 % (ref 12–46)
MCH: 31.4 pg (ref 26.0–34.0)
MCHC: 34.1 g/dL (ref 30.0–36.0)
MCV: 92.1 fL (ref 78.0–100.0)
MONO ABS: 0.4 10*3/uL (ref 0.1–1.0)
MPV: 9.7 fL (ref 9.4–12.4)
Monocytes Relative: 5 % (ref 3–12)
Neutro Abs: 3.3 10*3/uL (ref 1.7–7.7)
Neutrophils Relative %: 44 % (ref 43–77)
Platelets: 260 10*3/uL (ref 150–400)
RBC: 4.94 MIL/uL (ref 4.22–5.81)
RDW: 13.3 % (ref 11.5–15.5)
WBC: 7.4 10*3/uL (ref 4.0–10.5)

## 2014-04-30 LAB — COMPLETE METABOLIC PANEL WITH GFR
ALT: 16 U/L (ref 0–53)
AST: 16 U/L (ref 0–37)
Albumin: 4.4 g/dL (ref 3.5–5.2)
Alkaline Phosphatase: 95 U/L (ref 39–117)
BUN: 12 mg/dL (ref 6–23)
CALCIUM: 9.6 mg/dL (ref 8.4–10.5)
CO2: 28 meq/L (ref 19–32)
CREATININE: 0.97 mg/dL (ref 0.50–1.35)
Chloride: 105 mEq/L (ref 96–112)
GFR, Est Non African American: 83 mL/min
Glucose, Bld: 120 mg/dL — ABNORMAL HIGH (ref 70–99)
Potassium: 4.1 mEq/L (ref 3.5–5.3)
Sodium: 141 mEq/L (ref 135–145)
Total Bilirubin: 0.4 mg/dL (ref 0.2–1.2)
Total Protein: 7.4 g/dL (ref 6.0–8.3)

## 2014-04-30 LAB — RPR

## 2014-05-01 LAB — T-HELPER CELL (CD4) - (RCID CLINIC ONLY)
CD4 % Helper T Cell: 30 % — ABNORMAL LOW (ref 33–55)
CD4 T Cell Abs: 1020 /uL (ref 400–2700)

## 2014-05-03 LAB — HIV-1 RNA QUANT-NO REFLEX-BLD: HIV 1 RNA Quant: <20 copies/mL (ref <20)

## 2014-06-02 ENCOUNTER — Ambulatory Visit (INDEPENDENT_AMBULATORY_CARE_PROVIDER_SITE_OTHER): Payer: Medicare Other | Admitting: Infectious Disease

## 2014-06-02 ENCOUNTER — Encounter: Payer: Self-pay | Admitting: Infectious Disease

## 2014-06-02 VITALS — BP 142/89 | HR 79 | Temp 97.8°F | Wt 177.0 lb

## 2014-06-02 DIAGNOSIS — M8700 Idiopathic aseptic necrosis of unspecified bone: Secondary | ICD-10-CM

## 2014-06-02 DIAGNOSIS — F17200 Nicotine dependence, unspecified, uncomplicated: Secondary | ICD-10-CM

## 2014-06-02 DIAGNOSIS — B20 Human immunodeficiency virus [HIV] disease: Secondary | ICD-10-CM | POA: Diagnosis not present

## 2014-06-02 DIAGNOSIS — M879 Osteonecrosis, unspecified: Secondary | ICD-10-CM

## 2014-06-02 DIAGNOSIS — Z720 Tobacco use: Secondary | ICD-10-CM

## 2014-06-02 DIAGNOSIS — I10 Essential (primary) hypertension: Secondary | ICD-10-CM | POA: Diagnosis not present

## 2014-06-02 MED ORDER — AMLODIPINE BESYLATE 5 MG PO TABS: 5.0000 mg | ORAL_TABLET | Freq: Every day | ORAL | Status: DC

## 2014-06-02 NOTE — Progress Notes (Signed)
  Subjective:    Patient ID: Wayne Santos, male    DOB: June 23, 1950, 64 y.o.   MRN: [id number]  HPI   Wayne Santos is a 64 y.o. male who is doing superbly well on his  antiviral regimen, Prezista Norvir Truvada with undetectable viral load and health cd4 count.   His A1c came back at 6.2 which is nearly at the level WHO considers to make diagnosis of Diabetes.  He has had multiple problems recently including an outbreak of shingles as well as a tear to skin on his penis that caused copious hematuria and was worked up by Urology and Urgent care.    Review of Systems  Constitutional: Negative for fever, chills, diaphoresis, activity change, appetite change, fatigue and unexpected weight change.  HENT: Negative for congestion, rhinorrhea, sinus pressure, sneezing, sore throat and trouble swallowing.   Eyes: Negative for photophobia and visual disturbance.  Respiratory: Negative for cough, chest tightness, shortness of breath, wheezing and stridor.   Cardiovascular: Negative for chest pain, palpitations and leg swelling.  Gastrointestinal: Negative for nausea, vomiting, abdominal pain, diarrhea, constipation, blood in stool, abdominal distention and anal bleeding.  Genitourinary: Negative for dysuria, hematuria, flank pain and difficulty urinating.  Musculoskeletal: Negative for myalgias, joint swelling and gait problem.  Skin: Negative for color change, pallor, rash and wound.  Neurological: Negative for dizziness, tremors, weakness and light-headedness.  Hematological: Negative for adenopathy. Does not bruise/bleed easily.  Psychiatric/Behavioral: Negative for behavioral problems, confusion, sleep disturbance, dysphoric mood, decreased concentration and agitation.       Objective:   Physical Exam  Constitutional: He is oriented to person, place, and time. He appears well-developed and well-nourished. No distress.  HENT:  Head: Normocephalic and atraumatic.  Mouth/Throat: Oropharynx  is clear and moist. No oropharyngeal exudate.  Eyes: Conjunctivae and EOM are normal. Pupils are equal, round, and reactive to light. No scleral icterus.  Neck: Normal range of motion. Neck supple. No JVD present.  Cardiovascular: Normal rate, regular rhythm and normal heart sounds.  Exam reveals no gallop and no friction rub.   No murmur heard. Pulmonary/Chest: Effort normal and breath sounds normal. No respiratory distress. He has no wheezes. He has no rales. He exhibits no tenderness.  Abdominal: He exhibits no distension and no mass. There is no tenderness. There is no rebound and no guarding.  Musculoskeletal: He exhibits no edema or tenderness.  Lymphadenopathy:    He has no cervical adenopathy.  Neurological: He is alert and oriented to person, place, and time. He exhibits normal muscle tone. Coordination normal.  Skin: Skin is warm and dry. He is not diaphoretic. No erythema. No pallor.  Psychiatric: He has a normal mood and affect. His behavior is normal. Judgment and thought content normal.          Assessment & Plan:  HIV disease: continue current regimen and check HIV genosure archive in future. I spent greater than 25 minutes with the patient including greater than 50% of time in face to face counsel of the patient and in coordination of their care.   HTN: continue norvasc   AVN: will need other hip replaced as well, He is supposed to be taking  fosomax, vitamin d high dose and  calcium 1000mg  daily.    Prediabetes: if keeps gaining undoubtedly will be with DM and need metformin, A1c at next visit

## 2014-09-10 ENCOUNTER — Other Ambulatory Visit: Payer: Self-pay | Admitting: Infectious Disease

## 2014-10-14 ENCOUNTER — Other Ambulatory Visit: Payer: Self-pay | Admitting: Infectious Diseases

## 2014-12-13 ENCOUNTER — Other Ambulatory Visit: Payer: Self-pay | Admitting: Infectious Disease

## 2014-12-13 DIAGNOSIS — B20 Human immunodeficiency virus [HIV] disease: Secondary | ICD-10-CM

## 2015-01-13 ENCOUNTER — Other Ambulatory Visit: Payer: Self-pay | Admitting: Infectious Disease

## 2015-01-26 ENCOUNTER — Other Ambulatory Visit (HOSPITAL_COMMUNITY)
Admission: RE | Admit: 2015-01-26 | Discharge: 2015-01-26 | Disposition: A | Discharge status: Home / Self Care | Payer: Medicare Other | Source: Ambulatory Visit | Attending: Infectious Disease | Admitting: Infectious Disease

## 2015-01-26 ENCOUNTER — Other Ambulatory Visit: Payer: Medicare Other

## 2015-01-26 DIAGNOSIS — E785 Hyperlipidemia, unspecified: Secondary | ICD-10-CM | POA: Diagnosis not present

## 2015-01-26 DIAGNOSIS — B20 Human immunodeficiency virus [HIV] disease: Secondary | ICD-10-CM | POA: Diagnosis not present

## 2015-01-26 DIAGNOSIS — Z113 Encounter for screening for infections with a predominantly sexual mode of transmission: Secondary | ICD-10-CM | POA: Diagnosis not present

## 2015-01-26 LAB — LIPID PANEL
Cholesterol: 167 mg/dL (ref 125–200)
HDL: 32 mg/dL — ABNORMAL LOW (ref >=40)
LDL CALC: 113 mg/dL (ref <130)
TRIGLYCERIDES: 112 mg/dL (ref <150)
Total CHOL/HDL Ratio: 5.2 Ratio — ABNORMAL HIGH (ref <=5.0)
VLDL: 22 mg/dL (ref <30)

## 2015-01-26 LAB — COMPLETE METABOLIC PANEL WITH GFR
ALBUMIN: 4.4 g/dL (ref 3.6–5.1)
ALK PHOS: 122 U/L — AB (ref 40–115)
ALT: 16 U/L (ref 9–46)
AST: 14 U/L (ref 10–35)
BILIRUBIN TOTAL: 0.4 mg/dL (ref 0.2–1.2)
BUN: 14 mg/dL (ref 7–25)
CO2: 28 mmol/L (ref 20–31)
CREATININE: 0.89 mg/dL (ref 0.70–1.25)
Calcium: 9.2 mg/dL (ref 8.6–10.3)
Chloride: 104 mmol/L (ref 98–110)
GFR, Est African American: >89 mL/min (ref >=60)
GFR, Est Non African American: >89 mL/min (ref >=60)
GLUCOSE: 118 mg/dL — AB (ref 65–99)
Potassium: 3.6 mmol/L (ref 3.5–5.3)
SODIUM: 142 mmol/L (ref 135–146)
TOTAL PROTEIN: 7.4 g/dL (ref 6.1–8.1)

## 2015-01-26 LAB — CBC WITH DIFFERENTIAL/PLATELET
BASOS ABS: 0 10*3/uL (ref 0.0–0.1)
BASOS PCT: 0 % (ref 0–1)
EOS ABS: 0.4 10*3/uL (ref 0.0–0.7)
Eosinophils Relative: 5 % (ref 0–5)
HCT: 43.3 % (ref 39.0–52.0)
HEMOGLOBIN: 14.9 g/dL (ref 13.0–17.0)
Lymphocytes Relative: 31 % (ref 12–46)
Lymphs Abs: 2.4 10*3/uL (ref 0.7–4.0)
MCH: 31.5 pg (ref 26.0–34.0)
MCHC: 34.4 g/dL (ref 30.0–36.0)
MCV: 91.5 fL (ref 78.0–100.0)
MONOS PCT: 6 % (ref 3–12)
MPV: 9.7 fL (ref 8.6–12.4)
Monocytes Absolute: 0.5 10*3/uL (ref 0.1–1.0)
NEUTROS ABS: 4.5 10*3/uL (ref 1.7–7.7)
NEUTROS PCT: 58 % (ref 43–77)
PLATELETS: 225 10*3/uL (ref 150–400)
RBC: 4.73 MIL/uL (ref 4.22–5.81)
RDW: 13.8 % (ref 11.5–15.5)
WBC: 7.7 10*3/uL (ref 4.0–10.5)

## 2015-01-27 LAB — HEPATITIS C ANTIBODY: HCV AB: NEGATIVE

## 2015-01-27 LAB — URINE CYTOLOGY ANCILLARY ONLY
Chlamydia: NEGATIVE
Neisseria Gonorrhea: NEGATIVE

## 2015-01-27 LAB — HIV-1 RNA QUANT-NO REFLEX-BLD

## 2015-01-27 LAB — MICROALBUMIN / CREATININE URINE RATIO
Creatinine, Urine: 115.7 mg/dL
Microalb Creat Ratio: 6.9 mg/g (ref 0.0–30.0)
Microalb, Ur: 0.8 mg/dL (ref <2.0)

## 2015-01-27 LAB — RPR

## 2015-01-27 LAB — T-HELPER CELL (CD4) - (RCID CLINIC ONLY)
CD4 % Helper T Cell: 31 % — ABNORMAL LOW (ref 33–55)
CD4 T CELL ABS: 810 /uL (ref 400–2700)

## 2015-02-09 ENCOUNTER — Ambulatory Visit (INDEPENDENT_AMBULATORY_CARE_PROVIDER_SITE_OTHER): Payer: Medicare Other | Admitting: Infectious Disease

## 2015-02-09 ENCOUNTER — Encounter: Payer: Self-pay | Admitting: Infectious Disease

## 2015-02-09 VITALS — BP 143/84 | HR 74 | Temp 97.7°F | Ht 69.0 in | Wt 185.0 lb

## 2015-02-09 DIAGNOSIS — M8700 Idiopathic aseptic necrosis of unspecified bone: Secondary | ICD-10-CM

## 2015-02-09 DIAGNOSIS — I10 Essential (primary) hypertension: Secondary | ICD-10-CM | POA: Diagnosis not present

## 2015-02-09 DIAGNOSIS — B20 Human immunodeficiency virus [HIV] disease: Secondary | ICD-10-CM | POA: Diagnosis not present

## 2015-02-09 DIAGNOSIS — Z23 Encounter for immunization: Secondary | ICD-10-CM

## 2015-02-09 MED ORDER — EMTRICITABINE-TENOFOVIR AF 200-25 MG PO TABS: 1.0000 | ORAL_TABLET | Freq: Every day | ORAL | Status: DC

## 2015-02-09 MED ORDER — DARUNAVIR-COBICISTAT 800-150 MG PO TABS: 1.0000 | ORAL_TABLET | Freq: Every day | ORAL | Status: DC

## 2015-02-09 NOTE — Patient Instructions (Signed)
We are changing you to  PREZCOBIX big pink pill  And  Descovy little blue pill  These TWO medicines take the place of Prezista/Norvir/Truvada and do exactly what that regimen does

## 2015-02-09 NOTE — Progress Notes (Signed)
Chief complaint: followup for HIV on medications  Subjective:    Patient ID: Wayne Santos, male    DOB: November 25, 1950, 64 y.o.   MRN: [id number]  HPI   Wayne Santos is a 64 y.o. male who is doing superbly well on his  antiviral regimen, Prezista Norvir Truvada with undetectable viral load and health cd4 count.   We had extensive discussion re newer ARV regimen and will change to that.  Past Medical History  Diagnosis Date  . Depression   . HIV infection (HCC)   . AVN (avascular necrosis of bone) (HCC)   . Anemia   . Anxiety   . Glaucoma   . Osteoporosis   . Hypertension   . Hyperlipidemia     Past Surgical History  Procedure Laterality Date  . Joint replacement    . Total hip arthroplasty      No family history on file.    Social History   Social History  . Marital Status: Single    Spouse Name: N/A  . Number of Children: N/A  . Years of Education: N/A   Social History Main Topics  . Smoking status: Current Every Day Smoker -- 0.50 packs/day    Types: Cigarettes  . Smokeless tobacco: None  . Alcohol Use: No  . Drug Use: No  . Sexual Activity: Yes     Comment: declined condoms   Other Topics Concern  . None   Social History Narrative    Allergies  Allergen Reactions  . Ace Inhibitors     COUGH     Current outpatient prescriptions:  .  alendronate (FOSAMAX) 70 MG tablet, Take 1 tablet (70 mg total) by mouth every 7 (seven) days. Take with a full glass of water on an empty stomach., Disp: 4 tablet, Rfl: 11 .  amLODipine (NORVASC) 5 MG tablet, Take 1 tablet (5 mg total) by mouth daily., Disp: 30 tablet, Rfl: 11 .  aspirin 81 MG tablet, Take 81 mg by mouth daily.  , Disp: , Rfl:  .  mometasone (NASONEX) 50 MCG/ACT nasal spray, 2 sprays by Nasal route daily.  , Disp: , Rfl:  .  naproxen sodium (ANAPROX) 220 MG tablet, Take 220 mg by mouth 2 (two) times daily with a meal., Disp: , Rfl:  .  pravastatin (PRAVACHOL) 40 MG tablet, Take 1 tablet (40 mg  total) by mouth daily., Disp: 30 tablet, Rfl: 11 .  TRAVATAN Z 0.004 % SOLN ophthalmic solution, , Disp: , Rfl:  .  Vitamin D, Ergocalciferol, (DRISDOL) 50000 UNITS CAPS capsule, Take 1 capsule (50,000 Units total) by mouth every 7 (seven) days., Disp: 8 capsule, Rfl: 0 .  darunavir-cobicistat (PREZCOBIX) 800-150 MG tablet, Take 1 tablet by mouth daily., Disp: 30 tablet, Rfl: 11 .  emtricitabine-tenofovir AF (DESCOVY) 200-25 MG tablet, Take 1 tablet by mouth daily., Disp: 30 tablet, Rfl: 11 .  valACYclovir (VALTREX) 1000 MG tablet, Take 1 tablet (1,000 mg total) by mouth 3 (three) times daily. (Patient not taking: Reported on 02/09/2015), Disp: 21 tablet, Rfl: 0    Review of Systems  Constitutional: Negative for fever, chills, diaphoresis, activity change, appetite change, fatigue and unexpected weight change.  HENT: Negative for congestion, rhinorrhea, sinus pressure, sneezing, sore throat and trouble swallowing.   Eyes: Negative for photophobia and visual disturbance.  Respiratory: Negative for cough, chest tightness, shortness of breath, wheezing and stridor.   Cardiovascular: Negative for chest pain, palpitations and leg swelling.  Gastrointestinal: Negative for nausea, vomiting, abdominal  pain, diarrhea, constipation, blood in stool, abdominal distention and anal bleeding.  Genitourinary: Negative for dysuria, hematuria, flank pain and difficulty urinating.  Musculoskeletal: Negative for myalgias, joint swelling and gait problem.  Skin: Negative for color change, pallor, rash and wound.  Neurological: Negative for dizziness, tremors, weakness and light-headedness.  Hematological: Negative for adenopathy. Does not bruise/bleed easily.  Psychiatric/Behavioral: Negative for behavioral problems, confusion, sleep disturbance, dysphoric mood, decreased concentration and agitation.       Objective:   Physical Exam  Constitutional: He is oriented to person, place, and time. He appears  well-developed and well-nourished. No distress.  HENT:  Head: Normocephalic and atraumatic.  Mouth/Throat: Oropharynx is clear and moist. No oropharyngeal exudate.  Eyes: Conjunctivae and EOM are normal. Pupils are equal, round, and reactive to light. No scleral icterus.  Neck: Normal range of motion. Neck supple. No JVD present.  Cardiovascular: Normal rate, regular rhythm and normal heart sounds.  Exam reveals no gallop and no friction rub.   No murmur heard. Pulmonary/Chest: Effort normal and breath sounds normal. No respiratory distress. He has no wheezes. He has no rales. He exhibits no tenderness.  Abdominal: He exhibits no distension and no mass. There is no tenderness. There is no rebound and no guarding.  Musculoskeletal: He exhibits no edema or tenderness.  Lymphadenopathy:    He has no cervical adenopathy.  Neurological: He is alert and oriented to person, place, and time. He exhibits normal muscle tone. Coordination normal.  Skin: Skin is warm and dry. He is not diaphoretic. No erythema. No pallor.  Psychiatric: He has a normal mood and affect. His behavior is normal. Judgment and thought content normal.          Assessment & Plan:  HIV disease: will change to Prezcobix and Descovy, rtc in 4 months  HTN: continue norvasc   AVN: will need other hip replaced as well, He is supposed to be taking  fosomax, vitamin d high dose and  calcium  daily but not compliant with these  I spent greater than 25 minutes with the patient including greater than 50% of time in face to face counsel of the patient re his HIV, new ARV regimen HTN, AVN, and and in coordination of their care.

## 2015-02-13 ENCOUNTER — Other Ambulatory Visit: Payer: Self-pay | Admitting: Infectious Disease

## 2015-03-16 ENCOUNTER — Other Ambulatory Visit: Payer: Self-pay | Admitting: Infectious Disease

## 2015-06-01 ENCOUNTER — Other Ambulatory Visit: Payer: Medicare Other

## 2015-06-01 DIAGNOSIS — B20 Human immunodeficiency virus [HIV] disease: Secondary | ICD-10-CM

## 2015-06-01 LAB — COMPLETE METABOLIC PANEL WITH GFR
ALK PHOS: 109 U/L (ref 40–115)
ALT: 18 U/L (ref 9–46)
AST: 13 U/L (ref 10–35)
Albumin: 4.2 g/dL (ref 3.6–5.1)
BILIRUBIN TOTAL: 0.3 mg/dL (ref 0.2–1.2)
BUN: 12 mg/dL (ref 7–25)
CALCIUM: 9.6 mg/dL (ref 8.6–10.3)
CO2: 26 mmol/L (ref 20–31)
CREATININE: 0.88 mg/dL (ref 0.70–1.25)
Chloride: 104 mmol/L (ref 98–110)
Glucose, Bld: 113 mg/dL — ABNORMAL HIGH (ref 65–99)
Potassium: 3.9 mmol/L (ref 3.5–5.3)
Sodium: 142 mmol/L (ref 135–146)
TOTAL PROTEIN: 7 g/dL (ref 6.1–8.1)

## 2015-06-01 LAB — CBC WITH DIFFERENTIAL/PLATELET
BASOS ABS: 0.1 10*3/uL (ref 0.0–0.1)
Basophils Relative: 1 % (ref 0–1)
EOS ABS: 0.4 10*3/uL (ref 0.0–0.7)
EOS PCT: 5 % (ref 0–5)
HEMATOCRIT: 42.3 % (ref 39.0–52.0)
Hemoglobin: 14.3 g/dL (ref 13.0–17.0)
LYMPHS ABS: 2.8 10*3/uL (ref 0.7–4.0)
LYMPHS PCT: 39 % (ref 12–46)
MCH: 31.4 pg (ref 26.0–34.0)
MCHC: 33.8 g/dL (ref 30.0–36.0)
MCV: 92.8 fL (ref 78.0–100.0)
MONOS PCT: 7 % (ref 3–12)
MPV: 10.1 fL (ref 8.6–12.4)
Monocytes Absolute: 0.5 10*3/uL (ref 0.1–1.0)
Neutro Abs: 3.5 10*3/uL (ref 1.7–7.7)
Neutrophils Relative %: 48 % (ref 43–77)
Platelets: 259 10*3/uL (ref 150–400)
RBC: 4.56 MIL/uL (ref 4.22–5.81)
RDW: 13.2 % (ref 11.5–15.5)
WBC: 7.3 10*3/uL (ref 4.0–10.5)

## 2015-06-02 LAB — RPR

## 2015-06-02 LAB — HIV-1 RNA QUANT-NO REFLEX-BLD: HIV 1 RNA Quant: <20 copies/mL (ref <20)

## 2015-06-03 LAB — T-HELPER CELL (CD4) - (RCID CLINIC ONLY)
CD4 % Helper T Cell: 30 % — ABNORMAL LOW (ref 33–55)
CD4 T CELL ABS: 850 /uL (ref 400–2700)

## 2015-06-09 ENCOUNTER — Other Ambulatory Visit: Payer: Self-pay | Admitting: Infectious Disease

## 2015-06-15 ENCOUNTER — Ambulatory Visit (INDEPENDENT_AMBULATORY_CARE_PROVIDER_SITE_OTHER): Payer: Medicare Other | Admitting: Infectious Disease

## 2015-06-15 ENCOUNTER — Encounter: Payer: Self-pay | Admitting: Infectious Disease

## 2015-06-15 VITALS — BP 133/68 | HR 79 | Temp 97.8°F | Ht 69.0 in | Wt 183.0 lb

## 2015-06-15 DIAGNOSIS — H8110 Benign paroxysmal vertigo, unspecified ear: Secondary | ICD-10-CM | POA: Diagnosis not present

## 2015-06-15 DIAGNOSIS — M8700 Idiopathic aseptic necrosis of unspecified bone: Secondary | ICD-10-CM | POA: Diagnosis not present

## 2015-06-15 DIAGNOSIS — F17200 Nicotine dependence, unspecified, uncomplicated: Secondary | ICD-10-CM

## 2015-06-15 DIAGNOSIS — B20 Human immunodeficiency virus [HIV] disease: Secondary | ICD-10-CM | POA: Diagnosis not present

## 2015-06-15 DIAGNOSIS — R413 Other amnesia: Secondary | ICD-10-CM

## 2015-06-15 DIAGNOSIS — I10 Essential (primary) hypertension: Secondary | ICD-10-CM | POA: Diagnosis not present

## 2015-06-15 HISTORY — DX: Other amnesia: R41.3

## 2015-06-15 HISTORY — DX: Benign paroxysmal vertigo, unspecified ear: H81.10

## 2015-06-15 NOTE — Progress Notes (Signed)
Chief complaint: followup for HIV on medications Subjective:    Patient ID: Wayne Santos, male    DOB: [birth year], 65 y.o.   MRN: [id number]  HPI  Wayne Santos is a 65 y.o. male who is doing superbly well on his  antiviral regimen, Prezcobix and Descovy with undetectable viral load and health cd4 count.   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  Past Medical History  Diagnosis Date  . Depression   . HIV infection (HCC)   . AVN (avascular necrosis of bone) (HCC)   . Anemia   . Anxiety   . Glaucoma   . Osteoporosis   . Hypertension   . Hyperlipidemia   . Benign paroxysmal positional vertigo 06/15/2015  . Memory impairment 06/15/2015    Past Surgical History  Procedure Laterality Date  . Joint replacement    . Total hip arthroplasty      No family history on file.    Social History   Social History  . Marital Status: Single    Spouse Name: N/A  . Number of Children: N/A  . Years of Education: N/A   Social History Main Topics  . Smoking status: Current Every Day Smoker -- 0.50 packs/day    Types: Cigarettes  . Smokeless tobacco: None  . Alcohol Use: No  . Drug Use: No  . Sexual Activity: Yes     Comment: declined condoms   Other Topics Concern  . None   Social History Narrative    Allergies  Allergen Reactions  . Ace Inhibitors     COUGH     Current outpatient prescriptions:  .  alendronate (FOSAMAX) 70 MG tablet, Take 1 tablet (70 mg total) by mouth every 7 (seven) days. Take with a full glass of water on an empty stomach., Disp: 4 tablet, Rfl: 11 .  amLODipine (NORVASC) 5 MG tablet, TAKE 1 TABLET BY MOUTH DAILY, Disp: 30 tablet, Rfl: 5 .  aspirin 81 MG tablet, Take 81 mg by mouth daily.  , Disp: , Rfl:  .   darunavir-cobicistat (PREZCOBIX) 800-150 MG tablet, Take 1 tablet by mouth daily., Disp: 30 tablet, Rfl: 11 .  emtricitabine-tenofovir AF (DESCOVY) 200-25 MG tablet, Take 1 tablet by mouth daily., Disp: 30 tablet, Rfl: 11 .  mometasone (NASONEX) 50 MCG/ACT nasal spray, 2 sprays by Nasal route daily.  , Disp: , Rfl:  .  naproxen sodium (ANAPROX) 220 MG tablet, Take 220 mg by mouth 2 (two) times daily with a meal., Disp: , Rfl:  .  pravastatin (PRAVACHOL) 40 MG tablet, TAKE 1 TABLET BY MOUTH EVERY DAY, Disp: 30 tablet, Rfl: 6 .  TRAVATAN Z 0.004 % SOLN ophthalmic solution, , Disp: , Rfl:  .  valACYclovir (VALTREX) 1000 MG tablet, Take 1 tablet (1,000 mg total) by mouth 3 (three) times daily., Disp: 21 tablet, Rfl: 0 .  Vitamin D, Ergocalciferol, (DRISDOL) 50000 UNITS CAPS capsule, Take 1 capsule (50,000 Units total) by mouth every 7 (seven) days., Disp: 8 capsule, Rfl: 0     Review of Systems  Constitutional: Negative for fever, chills, diaphoresis, activity change, appetite change, fatigue and unexpected weight change.  HENT: Negative for congestion, rhinorrhea, sinus pressure, sneezing, sore throat and trouble swallowing.   Eyes: Negative  for photophobia and visual disturbance.  Respiratory: Negative for cough, chest tightness, shortness of breath, wheezing and stridor.   Cardiovascular: Negative for chest pain, palpitations and leg swelling.  Gastrointestinal: Negative for nausea, vomiting, abdominal pain, diarrhea, constipation, blood in stool, abdominal distention and anal bleeding.  Genitourinary: Negative for dysuria, hematuria, flank pain and difficulty urinating.  Musculoskeletal: Negative for myalgias, back pain, joint swelling, arthralgias and gait problem.  Skin: Negative for color change, pallor, rash and wound.  Neurological: Positive for dizziness. Negative for tremors, weakness and light-headedness.  Hematological: Negative for adenopathy. Does not bruise/bleed easily.    Psychiatric/Behavioral: Negative for behavioral problems, confusion, sleep disturbance, dysphoric mood, decreased concentration and agitation.       Objective:   Physical Exam  Constitutional: He is oriented to person, place, and time. He appears well-developed and well-nourished.  HENT:  Head: Normocephalic and atraumatic.  Eyes: Conjunctivae and EOM are normal.  Neck: Normal range of motion. Neck supple.  Cardiovascular: Normal rate and regular rhythm.   Pulmonary/Chest: Effort normal. No respiratory distress. He has no wheezes.  Abdominal: Soft. He exhibits no distension.  Musculoskeletal: Normal range of motion. He exhibits no edema or tenderness.  Neurological: He is alert and oriented to person, place, and time.  Skin: Skin is warm and dry. No rash noted. No erythema. No pallor.  Psychiatric: He has a normal mood and affect. His behavior is normal. Judgment and thought content normal.  Nursing note and vitals reviewed.         Assessment & Plan:  HIV disease: Continue Prezcobix and Descovy, rtc in 6 months  HTN: continue norvasc   AVN: will need other hip replaced as well, He is supposed to be taking  fosomax--but frequent forgets to take it vitamin d high dose and  calcium  daily but not compliant with these  Benign positional vertigo seems to be resolved after using exercises.  Smoking encouraged him to stop smoking.  Problems with memory at times: I have asked him to consider the ACT G clinical trial involving  DTG vs MVC for HIV Neurocognitive   I spent greater than 40 minutes with the patient including greater than 50% of time in face to face counsel of the patient re his HIV,  HTN, AVN, BPV and possible memory problems  and and in coordination of his care.

## 2015-07-13 ENCOUNTER — Encounter: Payer: Self-pay | Admitting: Infectious Disease

## 2015-10-02 ENCOUNTER — Other Ambulatory Visit: Payer: Self-pay | Admitting: Infectious Disease

## 2015-11-02 ENCOUNTER — Ambulatory Visit (INDEPENDENT_AMBULATORY_CARE_PROVIDER_SITE_OTHER): Payer: Medicare Other | Admitting: Family Medicine

## 2015-11-02 VITALS — BP 126/78 | HR 79 | Temp 98.1°F | Resp 16 | Ht 67.75 in | Wt 185.6 lb

## 2015-11-02 DIAGNOSIS — R0989 Other specified symptoms and signs involving the circulatory and respiratory systems: Secondary | ICD-10-CM | POA: Diagnosis not present

## 2015-11-02 DIAGNOSIS — R0981 Nasal congestion: Secondary | ICD-10-CM | POA: Diagnosis not present

## 2015-11-02 DIAGNOSIS — F17200 Nicotine dependence, unspecified, uncomplicated: Secondary | ICD-10-CM | POA: Diagnosis not present

## 2015-11-02 DIAGNOSIS — R0982 Postnasal drip: Secondary | ICD-10-CM

## 2015-11-02 MED ORDER — BENZONATATE 200 MG PO CAPS: 200.0000 mg | ORAL_CAPSULE | Freq: Three times a day (TID) | ORAL | Status: DC | PRN

## 2015-11-02 MED ORDER — CETIRIZINE HCL 10 MG PO TABS: 10.0000 mg | ORAL_TABLET | Freq: Every day | ORAL | Status: DC

## 2015-11-02 MED ORDER — IPRATROPIUM BROMIDE 0.03 % NA SOLN: 2.0000 | Freq: Four times a day (QID) | NASAL | Status: DC

## 2015-11-02 MED ORDER — MOMETASONE FUROATE 50 MCG/ACT NA SUSP: 2.0000 | Freq: Every day | NASAL | Status: DC

## 2015-11-02 NOTE — Patient Instructions (Addendum)
Use the zyrtec and nasonex for at least 2 weeks.  Hopefully you will find yourself needing the atrovent and tessalon less after the first week.  If it doesn't work and you are still having sinus pressure/pain, I will call you in a course of amoxicillin.  Have your lung rechecked here or with a new PCP in 2-4 weeks. In the meantime, try to cut down on smoking and lots of deep breathing. Try blowing out while having your lips pursed (like through a straw) to try to get the base of your lungs to open up.  Meds ordered this encounter  Medications  . ipratropium (ATROVENT) 0.03 % nasal spray    Sig: Place 2 sprays into the nose 4 (four) times daily.    Dispense:  30 mL    Refill:  1  . cetirizine (ZYRTEC) 10 MG tablet    Sig: Take 1 tablet (10 mg total) by mouth at bedtime.    Dispense:  30 tablet    Refill:  11  . benzonatate (TESSALON) 200 MG capsule    Sig: Take 1 capsule (200 mg total) by mouth 3 (three) times daily as needed for cough.    Dispense:  40 capsule    Refill:  0  . mometasone (NASONEX) 50 MCG/ACT nasal spray    Sig: Place 2 sprays into the nose daily.    Dispense:  17 g    Refill:  12      IF you received an x-ray today, you will receive an invoice from [facility]. Please contact [facility] at [phone] with questions or concerns regarding your invoice.   IF you received labwork today, you will receive an invoice from [employer]. Please contact [facility] at [phone] with questions or concerns regarding your invoice.   Our billing staff will not be able to assist you with questions regarding bills from these companies.  You will be contacted with the lab results as soon as they are available. The fastest way to get your results is to activate your My Chart account. Instructions are located on the last page of this paperwork. If you have not heard from [place] regarding the results in 2 weeks, please contact this office.      Allergic Rhinitis Allergic rhinitis is when the mucous membranes in the nose respond to allergens. Allergens are particles in the air that cause your body to have an allergic reaction. This causes you to release allergic antibodies. Through a chain of events, these eventually cause you to release histamine into the blood stream. Although meant to protect the body, it is this release of histamine that causes your discomfort, such as frequent sneezing, congestion, and an itchy, runny nose.  CAUSES Seasonal allergic rhinitis (hay fever) is caused by pollen allergens that may come from grasses, trees, and weeds. Year-round allergic rhinitis (perennial allergic rhinitis) is caused by allergens such as house dust mites, pet dander, and mold spores. SYMPTOMS  Nasal stuffiness (congestion).  Itchy, runny nose with sneezing and tearing of the eyes. DIAGNOSIS Your health care provider can help you determine the allergen or allergens that trigger your symptoms. If you and your health care provider are unable to determine the allergen, skin or blood testing may be used. Your health care provider will diagnose your condition after taking your health history and performing a physical exam. Your health care provider may assess you for other related conditions, such as asthma, pink eye, or an ear infection. TREATMENT Allergic  rhinitis does not have a cure, but it can be controlled by:  Medicines that block allergy symptoms. These may include allergy shots, nasal sprays, and oral antihistamines.  Avoiding the allergen. Hay fever may often be treated with antihistamines in pill or nasal spray forms. Antihistamines block the effects of histamine. There are over-the-counter medicines that may help with nasal congestion and swelling around the eyes. Check with your health care provider before taking or giving this medicine. If avoiding the allergen or the medicine prescribed do not work, there are many new  medicines your health care provider can prescribe. Stronger medicine may be used if initial measures are ineffective. Desensitizing injections can be used if medicine and avoidance does not work. Desensitization is when a patient is given ongoing shots until the body becomes less sensitive to the allergen. Make sure you follow up with your health care provider if problems continue. HOME CARE INSTRUCTIONS It is not possible to completely avoid allergens, but you can reduce your symptoms by taking steps to limit your exposure to them. It helps to know exactly what you are allergic to so that you can avoid your specific triggers. SEEK MEDICAL CARE IF:  You have a fever.  You develop a cough that does not stop easily (persistent).  You have shortness of breath.  You start wheezing.  Symptoms interfere with normal daily activities.   This information is not intended to replace advice given to you by your health care provider. Make sure you discuss any questions you have with your health care provider.   Document Released: 01/18/2001 Document Revised: 05/16/2014 Document Reviewed: 12/31/2012 Elsevier Interactive Patient Education 2016 Elsevier Inc. Sinusitis, Adult Sinusitis is redness, soreness, and inflammation of the paranasal sinuses. Paranasal sinuses are air pockets within the bones of your face. They are located beneath your eyes, in the middle of your forehead, and above your eyes. In healthy paranasal sinuses, mucus is able to drain out, and air is able to circulate through them by way of your nose. However, when your paranasal sinuses are inflamed, mucus and air can become trapped. This can allow bacteria and other germs to grow and cause infection. Sinusitis can develop quickly and last only a short time (acute) or continue over a long period (chronic). Sinusitis that lasts for more than 12 weeks is considered chronic. CAUSES Causes of sinusitis include:  Allergies.  Structural  abnormalities, such as displacement of the cartilage that separates your nostrils (deviated septum), which can decrease the air flow through your nose and sinuses and affect sinus drainage.  Functional abnormalities, such as when the small hairs (cilia) that line your sinuses and help remove mucus do not work properly or are not present. SIGNS AND SYMPTOMS Symptoms of acute and chronic sinusitis are the same. The primary symptoms are pain and pressure around the affected sinuses. Other symptoms include:  Upper toothache.  Earache.  Headache.  Bad breath.  Decreased sense of smell and taste.  A cough, which worsens when you are lying flat.  Fatigue.  Fever.  Thick drainage from your nose, which often is green and may contain pus (purulent).  Swelling and warmth over the affected sinuses. DIAGNOSIS Your health care provider will perform a physical exam. During your exam, your health care provider may perform any of the following to help determine if you have acute sinusitis or chronic sinusitis:  Look in your nose for signs of abnormal growths in your nostrils (nasal polyps).  Tap over the affected sinus  to check for signs of infection.  View the inside of your sinuses using an imaging device that has a light attached (endoscope). If your health care provider suspects that you have chronic sinusitis, one or more of the following tests may be recommended:  Allergy tests.  Nasal culture. A sample of mucus is taken from your nose, sent to a lab, and screened for bacteria.  Nasal cytology. A sample of mucus is taken from your nose and examined by your health care provider to determine if your sinusitis is related to an allergy. TREATMENT Most cases of acute sinusitis are related to a viral infection and will resolve on their own within 10 days. Sometimes, medicines are prescribed to help relieve symptoms of both acute and chronic sinusitis. These may include pain medicines,  decongestants, nasal steroid sprays, or saline sprays. However, for sinusitis related to a bacterial infection, your health care provider will prescribe antibiotic medicines. These are medicines that will help kill the bacteria causing the infection. Rarely, sinusitis is caused by a fungal infection. In these cases, your health care provider will prescribe antifungal medicine. For some cases of chronic sinusitis, surgery is needed. Generally, these are cases in which sinusitis recurs more than 3 times per year, despite other treatments. HOME CARE INSTRUCTIONS  Drink plenty of water. Water helps thin the mucus so your sinuses can drain more easily.  Use a humidifier.  Inhale steam 3-4 times a day (for example, sit in the bathroom with the shower running).  Apply a warm, moist washcloth to your face 3-4 times a day, or as directed by your health care provider.  Use saline nasal sprays to help moisten and clean your sinuses.  Take medicines only as directed by your health care provider.  If you were prescribed either an antibiotic or antifungal medicine, finish it all even if you start to feel better. SEEK IMMEDIATE MEDICAL CARE IF: 1. You have increasing pain or severe headaches. 2. You have nausea, vomiting, or drowsiness. 3. You have swelling around your face. 4. You have vision problems. 5. You have a stiff neck. 6. You have difficulty breathing.   This information is not intended to replace advice given to you by your health care provider. Make sure you discuss any questions you have with your health care provider.   Document Released: 04/25/2005 Document Revised: 05/16/2014 Document Reviewed: 05/10/2011 Elsevier Interactive Patient Education 2016 Elsevier Inc. Chronic Obstructive Pulmonary Disease Chronic obstructive pulmonary disease (COPD) is a common lung condition in which airflow from the lungs is limited. COPD is a general term that can be used to describe many different  lung problems that limit airflow, including both chronic bronchitis and emphysema. If you have COPD, your lung function will probably never return to normal, but there are measures you can take to improve lung function and make yourself feel better. CAUSES   Smoking (common).  Exposure to secondhand smoke.  Genetic problems.  Chronic inflammatory lung diseases or recurrent infections. SYMPTOMS  Shortness of breath, especially with physical activity.  Deep, persistent (chronic) cough with a large amount of thick mucus.  Wheezing.  Rapid breaths (tachypnea).  Gray or bluish discoloration (cyanosis) of the skin, especially in your fingers, toes, or lips.  Fatigue.  Weight loss.  Frequent infections or episodes when breathing symptoms become much worse (exacerbations).  Chest tightness. DIAGNOSIS Your health care provider will take a medical history and perform a physical examination to diagnose COPD. Additional tests for COPD may include:  Lung (pulmonary) function tests.  Chest X-ray.  CT scan.  Blood tests. TREATMENT  Treatment for COPD may include:  Inhaler and nebulizer medicines. These help manage the symptoms of COPD and make your breathing more comfortable.  Supplemental oxygen. Supplemental oxygen is only helpful if you have a low oxygen level in your blood.  Exercise and physical activity. These are beneficial for nearly all people with COPD.  Lung surgery or transplant.  Nutrition therapy to gain weight, if you are underweight.  Pulmonary rehabilitation. This may involve working with a team of health care providers and specialists, such as respiratory, occupational, and physical therapists. HOME CARE INSTRUCTIONS  Take all medicines (inhaled or pills) as directed by your health care provider.  Avoid over-the-counter medicines or cough syrups that dry up your airway (such as antihistamines) and slow down the elimination of secretions unless instructed  otherwise by your health care provider.  If you are a smoker, the most important thing that you can do is stop smoking. Continuing to smoke will cause further lung damage and breathing trouble. Ask your health care provider for help with quitting smoking. He or she can direct you to community resources or hospitals that provide support.  Avoid exposure to irritants such as smoke, chemicals, and fumes that aggravate your breathing.  Use oxygen therapy and pulmonary rehabilitation if directed by your health care provider. If you require home oxygen therapy, ask your health care provider whether you should purchase a pulse oximeter to measure your oxygen level at home.  Avoid contact with individuals who have a contagious illness.  Avoid extreme temperature and humidity changes.  Eat healthy foods. Eating smaller, more frequent meals and resting before meals may help you maintain your strength.  Stay active, but balance activity with periods of rest. Exercise and physical activity will help you maintain your ability to do things you want to do.  Preventing infection and hospitalization is very important when you have COPD. Make sure to receive all the vaccines your health care provider recommends, especially the pneumococcal and influenza vaccines. Ask your health care provider whether you need a pneumonia vaccine.  Learn and use relaxation techniques to manage stress.  Learn and use controlled breathing techniques as directed by your health care provider. Controlled breathing techniques include:  Pursed lip breathing. Start by breathing in (inhaling) through your nose for 1 second. Then, purse your lips as if you were going to whistle and breathe out (exhale) through the pursed lips for 2 seconds.  Diaphragmatic breathing. Start by putting one hand on your abdomen just above your waist. Inhale slowly through your nose. The hand on your abdomen should move out. Then purse your lips and exhale  slowly. You should be able to feel the hand on your abdomen moving in as you exhale.  Learn and use controlled coughing to clear mucus from your lungs. Controlled coughing is a series of short, progressive coughs. The steps of controlled coughing are: 7. Lean your head slightly forward. 8. Breathe in deeply using diaphragmatic breathing. 9. Try to hold your breath for 3 seconds. 10. Keep your mouth slightly open while coughing twice. 11. Spit any mucus out into a tissue. 12. Rest and repeat the steps once or twice as needed. SEEK MEDICAL CARE IF:  You are coughing up more mucus than usual.  There is a change in the color or thickness of your mucus.  Your breathing is more labored than usual.  Your breathing is faster than usual.  SEEK IMMEDIATE MEDICAL CARE IF:  You have shortness of breath while you are resting.  You have shortness of breath that prevents you from:  Being able to talk.  Performing your usual physical activities.  You have chest pain lasting longer than 5 minutes.  Your skin color is more cyanotic than usual.  You measure low oxygen saturations for longer than 5 minutes with a pulse oximeter. MAKE SURE YOU:  Understand these instructions.  Will watch your condition.  Will get help right away if you are not doing well or get worse.   This information is not intended to replace advice given to you by your health care provider. Make sure you discuss any questions you have with your health care provider.   Document Released: 02/02/2005 Document Revised: 05/16/2014 Document Reviewed: 12/20/2012 Elsevier Interactive Patient Education [employer]

## 2015-11-02 NOTE — Progress Notes (Signed)
Subjective:  By signing my name below, I, Stann Oresung-Kai Tsai, attest that this documentation has been prepared under the direction and in the presence of Norberto SorensonEva Shaw, MD. Electronically Signed: Stann Oresung-Kai Tsai, Scribe. 11/02/2015 , 6:25 PM .  Patient was seen in Room 12 .   Patient ID: Wayne Santos, male    DOB: Aug 14, 1950, 65 y.o.   MRN: [id number] Chief Complaint  Patient presents with  . Nasal Congestion    worse at night, recently had a sinus infection    HPI Wayne DressJerry L Santos is a 65 y.o. male who [facility] complaining of nasal congestion that worsens at night.  Past medical history includes HIV on treatment with prezcobix and HTN. He is followed closely by Dr. Daiva EvesVan Dam. His last labs were done 5 months prior, CD4 count was 850, which is normal. His total white blood cell was normal.   Patient states he is unable to blow out anything during the day, like nothing is solidified. When he lays down, he starts having terrible congestion. He also reports having a coughing fit which causes him to become nauseated. He notes the symptoms have been ongoing for a couple of months now. He recently had sinus infection and used OTC medication for sinus relief. He states that mucinex helped bring phlegm and mucus out. He was also prescribed nasal spray but not refilled. He notes having bilateral temple pressure. He denies fever or chills. He is a current smoker. He has a history of asthma.   Past Medical History  Diagnosis Date  . Depression   . HIV infection (HCC)   . AVN (avascular necrosis of bone) (HCC)   . Anemia   . Anxiety   . Glaucoma   . Osteoporosis   . Hypertension   . Hyperlipidemia   . Benign paroxysmal positional vertigo 06/15/2015  . Memory impairment 06/15/2015   Prior to Admission medications   Medication Sig Start Date End Date Taking? Authorizing Provider  amLODipine (NORVASC) 5 MG tablet TAKE 1 TABLET BY MOUTH DAILY 06/09/15  Yes Ginnie SmartJeffrey C Hatcher, MD  aspirin 81 MG tablet  Take 81 mg by mouth daily.     Yes Historical Provider, MD  darunavir-cobicistat (PREZCOBIX) 800-150 MG tablet Take 1 tablet by mouth daily. 02/09/15  Yes Randall Hissornelius N Van Dam, MD  emtricitabine-tenofovir AF (DESCOVY) 200-25 MG tablet Take 1 tablet by mouth daily. 02/09/15  Yes Randall Hissornelius N Van Dam, MD  mometasone (NASONEX) 50 MCG/ACT nasal spray 2 sprays by Nasal route daily.     Yes Historical Provider, MD  naproxen sodium (ANAPROX) 220 MG tablet Take 220 mg by mouth 2 (two) times daily with a meal.   Yes Historical Provider, MD  pravastatin (PRAVACHOL) 40 MG tablet TAKE 1 TABLET BY MOUTH EVERY DAY 10/02/15  Yes Randall Hissornelius N Van Dam, MD  Vitamin D, Ergocalciferol, (DRISDOL) 50000 UNITS CAPS capsule Take 1 capsule (50,000 Units total) by mouth every 7 (seven) days. 04/08/13  Yes Randall Hissornelius N Van Dam, MD   Allergies  Allergen Reactions  . Ace Inhibitors     COUGH    Review of Systems  Constitutional: Positive for fatigue. Negative for fever, chills and diaphoresis.  HENT: Positive for congestion, rhinorrhea and sinus pressure.   Respiratory: Positive for cough. Negative for shortness of breath and wheezing.   Gastrointestinal: Positive for nausea. Negative for vomiting, diarrhea and constipation.       Objective:   Physical Exam  Constitutional: He is oriented to person, place,  and time. He appears well-developed and well-nourished. No distress.  HENT:  Head: Normocephalic and atraumatic.  Right Ear: Tympanic membrane is erythematous (normal but with subtle erythema).  Left Ear: Tympanic membrane normal.  Nose: Mucosal edema and rhinorrhea (with congestion) present.  Mouth/Throat: Posterior oropharyngeal erythema (with postnasal drip visible) present.  Eyes: EOM are normal. Pupils are equal, round, and reactive to light.  Neck: Neck supple. No thyromegaly present.  Cardiovascular: Normal rate, regular rhythm and normal heart sounds.   Pulmonary/Chest: Effort normal. No respiratory distress.  He has rales (bibasilar inspiratory).  Musculoskeletal: Normal range of motion.  Lymphadenopathy:    He has no cervical adenopathy.  Neurological: He is alert and oriented to person, place, and time.  Skin: Skin is warm and dry.  Psychiatric: He has a normal mood and affect. His behavior is normal.  Nursing note and vitals reviewed.   BP 126/78 mmHg  Pulse 79  Temp(Src) 98.1 F (36.7 C) (Oral)  Resp 16  Ht 5' 7.75" (1.721 m)  Wt 185 lb 9.6 oz (84.188 kg)  BMI 28.42 kg/m2  SpO2 96%    Assessment & Plan:  If no improvement of his symptoms, will call in a course of amoxicillin.   1. Sinus congestion   2. Postnasal drip   3. Tobacco use disorder   4. Chest rales    Pt with HIV but not currently immunosuppressed as viral load is undetectable and cd4 count nml  Meds ordered this encounter  Medications  . ipratropium (ATROVENT) 0.03 % nasal spray    Sig: Place 2 sprays into the nose 4 (four) times daily.    Dispense:  30 mL    Refill:  1  . cetirizine (ZYRTEC) 10 MG tablet    Sig: Take 1 tablet (10 mg total) by mouth at bedtime.    Dispense:  30 tablet    Refill:  11  . benzonatate (TESSALON) 200 MG capsule    Sig: Take 1 capsule (200 mg total) by mouth 3 (three) times daily as needed for cough.    Dispense:  40 capsule    Refill:  0  . mometasone (NASONEX) 50 MCG/ACT nasal spray    Sig: Place 2 sprays into the nose daily.    Dispense:  17 g    Refill:  12    I personally performed the services described in this documentation, which was scribed in my presence. The recorded information has been reviewed and considered, and addended by me as needed.   Norberto SorensonEva Shaw, M.D.  [facility]  [place] [address] [place], [place] [address] [phone] phone [phone] fax  11/08/2015 8:06 AM

## 2015-11-03 ENCOUNTER — Telehealth: Payer: Self-pay

## 2015-11-03 NOTE — Telephone Encounter (Signed)
Pt needs to talk with someone about his rx and not getting them correctly   Best number [phone]

## 2015-11-04 NOTE — Telephone Encounter (Signed)
Spoke with pt, he received the spray/mist

## 2015-11-04 NOTE — Telephone Encounter (Signed)
Spoke with pt, he states the pharmacy would not pay for his medications but he is not sure which ones. I will call [facility] to figure this out.

## 2015-11-04 NOTE — Telephone Encounter (Signed)
Left message for pt to call back  °

## 2015-11-05 NOTE — Telephone Encounter (Signed)
It looks like if [facility], he can get the tessalon pearles 200mg  #30 for $15.  If he doesn't have a [facility] them [facility] $14.53 with the goodrx coupon or we can change to the 100mg  pearle which is on the $10 list (for #30).  He can use any nasal steroid - the momethesome was rx'd to him prior so I refilled it - perhaps the [facility] pharmacist can show him which otc nasal steroid is the least expensive (if there are any sales).  Using goodrx, I think the flonase tends to be the cheapest - $16.97 at [facility] with goodrx coupon or he can by the exact same med over the counter (it appears to be $11.67 on [facility]). Or [facility] he can get flonase there for $15  If the pt wants to use goodrx coupons but doesn't think he can look it up online, I can text him or email him the coupons - just need the # or email.

## 2015-11-05 NOTE — Telephone Encounter (Signed)
Spoke with the pharmacist, his copay for momethesome is 70.00 dollars and Medicare part D does not pay for tessalon pearles, 51.00. Dr Clelia CroftShaw, please advise.

## 2015-12-01 ENCOUNTER — Other Ambulatory Visit: Payer: Self-pay | Admitting: Infectious Diseases

## 2016-02-01 ENCOUNTER — Other Ambulatory Visit: Payer: Self-pay | Admitting: Infectious Disease

## 2016-02-17 ENCOUNTER — Encounter: Payer: Self-pay | Admitting: Infectious Disease

## 2016-03-04 ENCOUNTER — Other Ambulatory Visit: Payer: Self-pay | Admitting: Infectious Disease

## 2016-03-30 ENCOUNTER — Other Ambulatory Visit: Payer: Self-pay | Admitting: Infectious Disease

## 2016-04-04 ENCOUNTER — Other Ambulatory Visit: Payer: Self-pay | Admitting: *Deleted

## 2016-04-04 DIAGNOSIS — E7849 Other hyperlipidemia: Secondary | ICD-10-CM

## 2016-04-04 MED ORDER — PRAVASTATIN SODIUM 40 MG PO TABS: 40.0000 mg | ORAL_TABLET | Freq: Every day | ORAL | 1 refills | Status: DC

## 2016-05-03 ENCOUNTER — Other Ambulatory Visit: Payer: Self-pay | Admitting: Infectious Disease

## 2016-05-03 DIAGNOSIS — B20 Human immunodeficiency virus [HIV] disease: Secondary | ICD-10-CM

## 2016-05-12 ENCOUNTER — Other Ambulatory Visit (HOSPITAL_COMMUNITY)
Admission: RE | Admit: 2016-05-12 | Discharge: 2016-05-12 | Disposition: A | Discharge status: Home / Self Care | Payer: Medicare Other | Source: Ambulatory Visit | Attending: Infectious Disease | Admitting: Infectious Disease

## 2016-05-12 ENCOUNTER — Other Ambulatory Visit: Payer: Medicare Other

## 2016-05-12 DIAGNOSIS — B20 Human immunodeficiency virus [HIV] disease: Secondary | ICD-10-CM | POA: Diagnosis not present

## 2016-05-12 LAB — CBC WITH DIFFERENTIAL/PLATELET
BASOS ABS: 77 {cells}/uL (ref 0–200)
Basophils Relative: 1 %
EOS ABS: 462 {cells}/uL (ref 15–500)
Eosinophils Relative: 6 %
HEMATOCRIT: 41.6 % (ref 38.5–50.0)
Hemoglobin: 14.2 g/dL (ref 13.2–17.1)
LYMPHS PCT: 38 %
Lymphs Abs: 2926 cells/uL (ref 850–3900)
MCH: 31.1 pg (ref 27.0–33.0)
MCHC: 34.1 g/dL (ref 32.0–36.0)
MCV: 91.2 fL (ref 80.0–100.0)
MONO ABS: 385 {cells}/uL (ref 200–950)
MPV: 10.2 fL (ref 7.5–12.5)
Monocytes Relative: 5 %
NEUTROS PCT: 50 %
Neutro Abs: 3850 cells/uL (ref 1500–7800)
Platelets: 301 10*3/uL (ref 140–400)
RBC: 4.56 MIL/uL (ref 4.20–5.80)
RDW: 13.5 % (ref 11.0–15.0)
WBC: 7.7 10*3/uL (ref 3.8–10.8)

## 2016-05-13 LAB — T-HELPER CELL (CD4) - (RCID CLINIC ONLY)
CD4 T CELL HELPER: 33 % (ref 33–55)
CD4 T Cell Abs: 1010 /uL (ref 400–2700)

## 2016-05-13 LAB — COMPLETE METABOLIC PANEL WITH GFR
ALT: 15 U/L (ref 9–46)
AST: 15 U/L (ref 10–35)
Albumin: 4.3 g/dL (ref 3.6–5.1)
Alkaline Phosphatase: 107 U/L (ref 40–115)
BUN: 8 mg/dL (ref 7–25)
CALCIUM: 9.6 mg/dL (ref 8.6–10.3)
CHLORIDE: 105 mmol/L (ref 98–110)
CO2: 23 mmol/L (ref 20–31)
Creat: 1.1 mg/dL (ref 0.70–1.25)
GFR, EST AFRICAN AMERICAN: 82 mL/min (ref >=60)
GFR, EST NON AFRICAN AMERICAN: 71 mL/min (ref >=60)
Glucose, Bld: 126 mg/dL — ABNORMAL HIGH (ref 65–99)
POTASSIUM: 3.8 mmol/L (ref 3.5–5.3)
Sodium: 141 mmol/L (ref 135–146)
Total Bilirubin: 0.4 mg/dL (ref 0.2–1.2)
Total Protein: 7.4 g/dL (ref 6.1–8.1)

## 2016-05-13 LAB — RPR

## 2016-05-13 LAB — URINE CYTOLOGY ANCILLARY ONLY
Chlamydia: NEGATIVE
Neisseria Gonorrhea: NEGATIVE

## 2016-05-16 LAB — HIV-1 RNA QUANT-NO REFLEX-BLD: HIV-1 RNA Quant, Log: <1.3 Log copies/mL (ref <1.30)

## 2016-05-18 ENCOUNTER — Encounter: Payer: Self-pay | Admitting: Infectious Disease

## 2016-05-18 ENCOUNTER — Ambulatory Visit (INDEPENDENT_AMBULATORY_CARE_PROVIDER_SITE_OTHER): Payer: Medicare Other | Admitting: Infectious Disease

## 2016-05-18 VITALS — BP 123/71 | HR 76 | Temp 98.0°F | Ht 68.5 in | Wt 179.0 lb

## 2016-05-18 DIAGNOSIS — Z23 Encounter for immunization: Secondary | ICD-10-CM

## 2016-05-18 DIAGNOSIS — I10 Essential (primary) hypertension: Secondary | ICD-10-CM

## 2016-05-18 DIAGNOSIS — Z79899 Other long term (current) drug therapy: Secondary | ICD-10-CM | POA: Diagnosis not present

## 2016-05-18 DIAGNOSIS — Z113 Encounter for screening for infections with a predominantly sexual mode of transmission: Secondary | ICD-10-CM

## 2016-05-18 DIAGNOSIS — F17200 Nicotine dependence, unspecified, uncomplicated: Secondary | ICD-10-CM | POA: Diagnosis not present

## 2016-05-18 DIAGNOSIS — B20 Human immunodeficiency virus [HIV] disease: Secondary | ICD-10-CM

## 2016-05-18 DIAGNOSIS — M8700 Idiopathic aseptic necrosis of unspecified bone: Secondary | ICD-10-CM

## 2016-05-18 NOTE — Progress Notes (Signed)
Chief complaint: followup for HIV on medications Subjective:    Patient ID: Wayne DressJerry L Art, male    DOB: 11/09/1950, 66 y.o.   MRN: [id number]  HPI  Wayne Santos is a 66 y.o. male who is doing superbly well on his  antiviral regimen, Prezcobix and Descovy with undetectable viral load and health cd4 count.   Lab Results  Component Value Date   HIV1RNAQUANT <20 05/12/2016   HIV1RNAQUANT <20 06/01/2015   HIV1RNAQUANT <20 01/26/2015   Lab Results  Component Value Date   CD4TABS 1,010 05/12/2016   CD4TABS 850 06/01/2015   CD4TABS 810 01/26/2015      Past Medical History:  Diagnosis Date  . Anemia   . Anxiety   . AVN (avascular necrosis of bone) (HCC)   . Benign paroxysmal positional vertigo 06/15/2015  . Depression   . Glaucoma   . HIV infection (HCC)   . Hyperlipidemia   . Hypertension   . Memory impairment 06/15/2015  . Osteoporosis     Past Surgical History:  Procedure Laterality Date  . JOINT REPLACEMENT    . TOTAL HIP ARTHROPLASTY      No family history on file.    Social History   Social History  . Marital status: Single    Spouse name: N/A  . Number of children: N/A  . Years of education: N/A   Social History Main Topics  . Smoking status: Current Every Day Smoker    Packs/day: 0.50    Types: Cigarettes  . Smokeless tobacco: Not on file  . Alcohol use No  . Drug use: No  . Sexual activity: Yes     Comment: declined condoms   Other Topics Concern  . Not on file   Social History Narrative  . No narrative on file    Allergies  Allergen Reactions  . Ace Inhibitors     COUGH     Current Outpatient Prescriptions:  .  amLODipine (NORVASC) 5 MG tablet, TAKE 1 TABLET BY MOUTH DAILY, Disp: 30 tablet, Rfl: 5 .  aspirin 81 MG tablet, Take 81 mg by mouth daily.  , Disp: , Rfl:  .  benzonatate (TESSALON) 200 MG capsule, Take 1 capsule (200 mg total) by mouth 3 (three) times daily as needed for cough., Disp: 40 capsule, Rfl: 0 .  cetirizine  (ZYRTEC) 10 MG tablet, Take 1 tablet (10 mg total) by mouth at bedtime., Disp: 30 tablet, Rfl: 11 .  DESCOVY 200-25 MG tablet, TAKE 1 TABLET BY MOUTH ONCE DAILY, Disp: 30 tablet, Rfl: 5 .  ipratropium (ATROVENT) 0.03 % nasal spray, Place 2 sprays into the nose 4 (four) times daily., Disp: 30 mL, Rfl: 1 .  mometasone (NASONEX) 50 MCG/ACT nasal spray, Place 2 sprays into the nose daily., Disp: 17 g, Rfl: 12 .  naproxen sodium (ANAPROX) 220 MG tablet, Take 220 mg by mouth 2 (two) times daily with a meal., Disp: , Rfl:  .  pravastatin (PRAVACHOL) 40 MG tablet, Take 1 tablet (40 mg total) by mouth daily., Disp: 30 tablet, Rfl: 1 .  PREZCOBIX 800-150 MG tablet, TAKE 1 TABLET BY MOUTH DAILY, Disp: 30 tablet, Rfl: 5 .  Vitamin D, Ergocalciferol, (DRISDOL) 50000 UNITS CAPS capsule, Take 1 capsule (50,000 Units total) by mouth every 7 (seven) days., Disp: 8 capsule, Rfl: 0     Review of Systems  Constitutional: Negative for activity change, appetite change, chills, diaphoresis, fatigue, fever and unexpected weight change.  HENT: Negative for congestion, rhinorrhea, sinus  pressure, sneezing, sore throat and trouble swallowing.   Eyes: Negative for photophobia and visual disturbance.  Respiratory: Negative for cough, chest tightness, shortness of breath, wheezing and stridor.   Cardiovascular: Negative for chest pain, palpitations and leg swelling.  Gastrointestinal: Negative for abdominal distention, abdominal pain, anal bleeding, blood in stool, constipation, diarrhea, nausea and vomiting.  Genitourinary: Negative for difficulty urinating, dysuria, flank pain and hematuria.  Musculoskeletal: Negative for arthralgias, back pain, gait problem, joint swelling and myalgias.  Skin: Negative for color change, pallor, rash and wound.  Neurological: Positive for dizziness. Negative for tremors, weakness and light-headedness.  Hematological: Negative for adenopathy. Does not bruise/bleed easily.    Psychiatric/Behavioral: Negative for agitation, behavioral problems, confusion, decreased concentration, dysphoric mood and sleep disturbance.       Objective:   Physical Exam  Constitutional: He is oriented to person, place, and time. He appears well-developed and well-nourished.  HENT:  Head: Normocephalic and atraumatic.  Eyes: Conjunctivae and EOM are normal.  Neck: Normal range of motion. Neck supple.  Cardiovascular: Normal rate and regular rhythm.   Pulmonary/Chest: Effort normal. No respiratory distress. He has no wheezes.  Abdominal: Soft. He exhibits no distension.  Musculoskeletal: Normal range of motion. He exhibits no edema or tenderness.  Neurological: He is alert and oriented to person, place, and time.  Skin: Skin is warm and dry. No rash noted. No erythema. No pallor.  Psychiatric: He has a normal mood and affect. His behavior is normal. Judgment and thought content normal.  Nursing note and vitals reviewed.         Assessment & Plan:  HIV disease: Continue Prezcobix and Descovy, rtc in 6 months. We discussed the fact that to major studies of shown no transmission of HIV from suppressed sexual partners to uninfected partners. He states he is not surfactant himself but been reading about this and talking about with other who are HIV positive  HTN: continue norvasc   AVN: He is to have the other hip replacement process started up when he sees Dr. Jerl Santos  He is supposed to be taking  fosomax--but frequent forgets to take it vitamin d high dose and  calcium 1000mg  daily but not compliant with these    Smoking encouraged him to stop smoking.  I spent greater than 25  minutes with the patient including greater than 50% of time in face to face counsel of the patient re his HIV,  HTN, AVN,  problems  and and in coordination of his care.

## 2016-05-31 ENCOUNTER — Other Ambulatory Visit: Payer: Self-pay | Admitting: Infectious Disease

## 2016-05-31 DIAGNOSIS — E7849 Other hyperlipidemia: Secondary | ICD-10-CM

## 2016-06-02 ENCOUNTER — Encounter: Payer: Self-pay | Admitting: Infectious Disease

## 2016-06-10 ENCOUNTER — Other Ambulatory Visit: Payer: Self-pay | Admitting: Orthopaedic Surgery

## 2016-06-24 ENCOUNTER — Encounter (HOSPITAL_COMMUNITY): Payer: Self-pay

## 2016-06-24 NOTE — Pre-Procedure Instructions (Signed)
Nicoletta DressJerry L Haliburton  06/24/2016      [facility] - [place] ([place]), [place] - [address] [place] [address] Phone: [phone] Fax: [phone]  [facility] - [place], [place] - [address] [facility] [address] [place] [address] Phone: [phone] Fax: [phone]  [facility] [phone] - [place], [place] - [id number] AT [facility] [address] [facility] [address] [place] [address] Phone: [phone] Fax: [phone]  [phone] [facility] - [place], [place] - [address] [place] [address] Phone: [phone] Fax: [phone]    Your procedure is scheduled on .  [facility] at 800 A.M.  Call this number if you have problems the morning of surgery:  [phone]   Remember:  Do not eat food or drink liquids after midnight.  Take these medicines the morning of surgery with A SIP OF WATER amlodipine (norvasc), Descovy, Nasonex nasal spray, prezcobix   Stop taking aspirin, BC's, Goody's, Herbal medications, Fish Oil, Herbal medications, Ibuprofen, Advil, Motrin, Aleve   Do not wear jewelry, make-up or nail polish.  Do not wear lotions, powders, or perfumes, or deoderant.  Do not shave 48 hours prior to surgery.  Men may shave face and neck.  Do not bring valuables to the hospital.  [facility] is not responsible for any belongings or valuables.  Contacts, dentures or bridgework may not be worn into surgery.  Leave your suitcase in the car.  After surgery it may be brought to your room.  For patients admitted to the hospital, discharge time will be determined by your treatment team.  Patients discharged the day of surgery will not be allowed to drive home.    Special instructions:  [facility] - Preparing for Surgery  Before surgery, you can play an important role.  Because skin is not sterile,  your skin needs to be as free of germs as possible.  You can reduce the number of germs on you skin by washing with CHG (chlorahexidine gluconate) soap before surgery.  CHG is an antiseptic cleaner which kills germs and bonds with the skin to continue killing germs even after washing.  Please DO NOT use if you have an allergy to CHG or antibacterial soaps.  If your skin becomes reddened/irritated stop using the CHG and inform your nurse when you arrive at Short Stay.  Do not shave (including legs and underarms) for at least 48 hours prior to the first CHG shower.  You may shave your face.  Please follow these instructions carefully:   1.  Shower with CHG Soap the night before surgery and the   morning of Surgery.  2.  If you choose to wash your hair, wash your hair first as usual with your  normal shampoo.  3.  After you shampoo, rinse your hair and body thoroughly to remove the Shampoo.  4.  Use CHG as you would any other liquid soap.  You can apply chg directly  to the skin and wash gently with scrungie or a clean washcloth.  5.  Apply the CHG Soap to your body ONLY FROM THE NECK DOWN.   Do not use on open wounds or open sores.  Avoid contact with your eyes,       ears, mouth and genitals (private parts).  Wash genitals (private parts) with your  normal soap.  6.  Wash thoroughly, paying special attention to the area where your surgery  will be performed.  7.  Thoroughly rinse your body with warm water from the neck down.  8.  DO NOT shower/wash with your normal soap after using and rinsing off  the CHG Soap.  9.  Pat yourself dry with a clean towel.            10.  Wear clean pajamas.            11.  Place clean sheets on your bed the night of your first shower and do not sleep with pets.  Day of Surgery  Do not apply any lotions/deoderants the morning of surgery.  Please wear clean clothes to the hospital/surgery center.     Please read over the following fact sheets that you were  given. Pain Booklet, Coughing and Deep Breathing, MRSA Information and Surgical Site Infection Prevention

## 2016-06-27 ENCOUNTER — Ambulatory Visit (HOSPITAL_COMMUNITY)
Admission: RE | Admit: 2016-06-27 | Discharge: 2016-06-27 | Disposition: A | Discharge status: Home / Self Care | Payer: Medicare Other | Source: Ambulatory Visit | Attending: Orthopaedic Surgery | Admitting: Orthopaedic Surgery

## 2016-06-27 ENCOUNTER — Encounter (HOSPITAL_COMMUNITY)
Admission: RE | Admit: 2016-06-27 | Discharge: 2016-06-27 | Disposition: A | Discharge status: Home / Self Care | Payer: Medicare Other | Source: Ambulatory Visit | Attending: Orthopaedic Surgery | Admitting: Orthopaedic Surgery

## 2016-06-27 ENCOUNTER — Encounter (HOSPITAL_COMMUNITY): Payer: Self-pay

## 2016-06-27 DIAGNOSIS — Z01812 Encounter for preprocedural laboratory examination: Secondary | ICD-10-CM | POA: Insufficient documentation

## 2016-06-27 DIAGNOSIS — Z01818 Encounter for other preprocedural examination: Secondary | ICD-10-CM | POA: Diagnosis not present

## 2016-06-27 DIAGNOSIS — R9431 Abnormal electrocardiogram [ECG] [EKG]: Secondary | ICD-10-CM | POA: Diagnosis not present

## 2016-06-27 DIAGNOSIS — Z01810 Encounter for preprocedural cardiovascular examination: Secondary | ICD-10-CM | POA: Insufficient documentation

## 2016-06-27 LAB — SURGICAL PCR SCREEN
MRSA, PCR: NEGATIVE
STAPHYLOCOCCUS AUREUS: NEGATIVE

## 2016-06-27 LAB — URINALYSIS, ROUTINE W REFLEX MICROSCOPIC
Bilirubin Urine: NEGATIVE
Glucose, UA: NEGATIVE mg/dL
Hgb urine dipstick: NEGATIVE
Ketones, ur: NEGATIVE mg/dL
LEUKOCYTES UA: NEGATIVE
Nitrite: NEGATIVE
PROTEIN: NEGATIVE mg/dL
Specific Gravity, Urine: 1.021 (ref 1.005–1.030)
pH: 5 (ref 5.0–8.0)

## 2016-06-27 LAB — CBC WITH DIFFERENTIAL/PLATELET
BASOS ABS: 0.1 10*3/uL (ref 0.0–0.1)
Basophils Relative: 1 %
Eosinophils Absolute: 1.2 10*3/uL — ABNORMAL HIGH (ref 0.0–0.7)
Eosinophils Relative: 13 %
HEMATOCRIT: 45.6 % (ref 39.0–52.0)
HEMOGLOBIN: 15.6 g/dL (ref 13.0–17.0)
Lymphocytes Relative: 32 %
Lymphs Abs: 2.9 10*3/uL (ref 0.7–4.0)
MCH: 31.5 pg (ref 26.0–34.0)
MCHC: 34.2 g/dL (ref 30.0–36.0)
MCV: 92.1 fL (ref 78.0–100.0)
Monocytes Absolute: 0.4 10*3/uL (ref 0.1–1.0)
Monocytes Relative: 4 %
NEUTROS ABS: 4.6 10*3/uL (ref 1.7–7.7)
NEUTROS PCT: 50 %
Platelets: 255 10*3/uL (ref 150–400)
RBC: 4.95 MIL/uL (ref 4.22–5.81)
RDW: 12.8 % (ref 11.5–15.5)
WBC: 9.1 10*3/uL (ref 4.0–10.5)

## 2016-06-27 LAB — PROTIME-INR
INR: 0.98
Prothrombin Time: 13 seconds (ref 11.4–15.2)

## 2016-06-27 LAB — BASIC METABOLIC PANEL
ANION GAP: 10 (ref 5–15)
BUN: 11 mg/dL (ref 6–20)
CALCIUM: 9.7 mg/dL (ref 8.9–10.3)
CO2: 27 mmol/L (ref 22–32)
Chloride: 106 mmol/L (ref 101–111)
Creatinine, Ser: 1.01 mg/dL (ref 0.61–1.24)
Glucose, Bld: 119 mg/dL — ABNORMAL HIGH (ref 65–99)
Potassium: 3.9 mmol/L (ref 3.5–5.1)
Sodium: 143 mmol/L (ref 135–145)

## 2016-06-27 LAB — APTT: APTT: 28 s (ref 24–36)

## 2016-06-27 NOTE — Progress Notes (Addendum)
PCP is Dr. Kellie ShropshireKim Santos in [place] Denies any chest Pain States he saw a cardiologist over 8 years ago, but doesn't remember his name.(saw Dr for HTN) States he had a stress test and echo over 8 years ago, states everything was normal. Instructed not to smoke on the day of surgery, Voices understanding. Request sent to Dr Renae GlossShelton for EKG, Last office visit, and any heart studies.

## 2016-06-27 NOTE — H&P (Signed)
TOTAL HIP ADMISSION H&P  Patient is admitted for left total hip arthroplasty.  Subjective:  Chief Complaint: left hip pain  HPI: Wayne Santos, 66 y.o. male, has a history of pain and functional disability in the left hip(s) due to arthritis and patient has failed non-surgical conservative treatments for greater than 12 weeks to include NSAID's and/or analgesics, flexibility and strengthening excercises, supervised PT with diminished ADL's post treatment, use of assistive devices, weight reduction as appropriate and activity modification.  Onset of symptoms was gradual starting 5 years ago with gradually worsening course since that time.The patient noted no past surgery on the left hip(s).  Patient currently rates pain in the left hip at 10 out of 10 with activity. Patient has night pain, worsening of pain with activity and weight bearing, trendelenberg gait, pain that interfers with activities of daily living and crepitus. Patient has evidence of subchondral cysts, subchondral sclerosis, periarticular osteophytes and joint space narrowing by imaging studies. This condition presents safety issues increasing the risk of falls. There is no current active infection.  Patient Active Problem List   Diagnosis Date Noted  . Encounter for long-term (current) use of medications 05/18/2016  . Routine screening for STI (sexually transmitted infection) 05/18/2016  . Benign paroxysmal positional vertigo 06/15/2015  . Memory impairment 06/15/2015  . Benign essential HTN 06/02/2014  . Hyperglycemia 02/04/2013  . AVN (avascular necrosis of bone) (HCC)   . Glaucoma   . Hyperlipidemia 03/16/2011  . ONYCHOMYCOSIS, TOENAILS 03/24/2010  . SMOKER 03/24/2010  . 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 POSTOPERATIVE INFECTION 07/10/2008  . POSTNASAL DRIP SYNDROME 12/13/2007  . URINARY TRACT INFECTION SITE NOT SPECIFIED 04/04/2007  . BOILS, RECURRENT 11/14/2006  . HIV disease (HCC) 09/12/2006  . PANIC ATTACK 09/12/2006  .  Essential hypertension 09/12/2006  . Aseptic necrosis of bone (HCC) 09/12/2006   Past Medical History:  Diagnosis Date  . Anemia   . Asthma    "when I was younger"  . AVN (avascular necrosis of bone) (HCC)   . Benign paroxysmal positional vertigo 06/15/2015  . Encounter for long-term (current) use of medications 05/18/2016  . Glaucoma   . Headache   . HIV infection (HCC)   . Hyperlipidemia   . Hypertension   . Memory impairment 06/15/2015  . Osteoporosis   . Pneumonia   . Routine screening for STI (sexually transmitted infection) 05/18/2016    Past Surgical History:  Procedure Laterality Date  . HAND NERVE REPAIR Right   . JOINT REPLACEMENT    . TOTAL HIP ARTHROPLASTY Right     No prescriptions prior to admission.   Allergies  Allergen Reactions  . Ace Inhibitors     COUGH    Social History  Substance Use Topics  . Smoking status: Current Every Day Smoker    Packs/day: 0.50    Types: Cigarettes  . Smokeless tobacco: Former NeurosurgeonUser  . Alcohol use 0.0 oz/week     Comment: rare    No family history on file.   Review of Systems  Musculoskeletal: Positive for joint pain.       Left hip  All other systems reviewed and are negative.   Objective:  Physical Exam  Constitutional: He is oriented to person, place, and time. He appears well-developed and well-nourished.  HENT:  Head: Normocephalic and atraumatic.  Eyes: Pupils are equal, round, and reactive to light.  Neck: Normal range of motion.  Cardiovascular: Normal rate and regular rhythm.   Respiratory: Effort normal.  GI: Soft.  Musculoskeletal:  Left hip motion is extremely limited. His leg lengths look roughly equal. Opposite moves well. He walks with an altered gait. Sensation and motor function are intact distally with palpable pulses on both feet.   Neurological: He is alert and oriented to person, place, and time.  Skin: Skin is warm and dry.  Psychiatric: He has a normal mood and affect. His behavior is  normal. Judgment and thought content normal.    Vital signs in last 24 hours: Temp:  [97.9 F (36.6 C)] 97.9 F (36.6 C) (02/19 0818) Pulse Rate:  [79] 79 (02/19 0818) Resp:  [20] 20 (02/19 0818) BP: (137)/(82) 137/82 (02/19 0818) SpO2:  [96 %] 96 % (02/19 0818) Weight:  [78.3 kg (172 lb 9.6 oz)] 78.3 kg (172 lb 9.6 oz) (02/19 0818)  Labs:   Estimated body mass index is 25.49 kg/m as calculated from the following:   Height as of 06/27/16: 5\' 9"  (1.753 m).   Weight as of 06/27/16: 78.3 kg (172 lb 9.6 oz).   Imaging Review Plain radiographs demonstrate severe degenerative joint disease of the left hip(s). The bone quality appears to be good for age and reported activity level.  Assessment/Plan:  End stage primary arthritis, left hip(s)  The patient history, physical examination, clinical judgement of the provider and imaging studies are consistent with end stage degenerative joint disease of the left hip(s) and total hip arthroplasty is deemed medically necessary. The treatment options including medical management, injection therapy, arthroscopy and arthroplasty were discussed at length. The risks and benefits of total hip arthroplasty were presented and reviewed. The risks due to aseptic loosening, infection, stiffness, dislocation/subluxation,  thromboembolic complications and other imponderables were discussed.  The patient acknowledged the explanation, agreed to proceed with the plan and consent was signed. Patient is being admitted for inpatient treatment for surgery, pain control, PT, OT, prophylactic antibiotics, VTE prophylaxis, progressive ambulation and ADL's and discharge planning.The patient is planning to be discharged home with home health services

## 2016-06-28 NOTE — Progress Notes (Addendum)
Anesthesia Chart Review: Patient is a 66 year old male scheduled for left THA, anterior approach on 07/05/16 by Dr. Jerl Santosalldorf.  History includes smoking, HIV (diagnosed '96), anemia, glaucoma, HLD, HTN, BPPV, memory impairment, childhood asthma, headaches, right THA 01/16/07.   PCP is Dr. Andi DevonKimberly Shelton. He reports seeing a cardiologist ~ 8 years ago with "normal" stress and echo. He did not know the name of the cardiologist that he saw.   Meds include amlodipine, aspirin 81 mg, Zyrtec, Descovy, Prezcobix, Nasonex, pravastatin, vitamin D.  BP 137/82   Pulse 79   Temp 36.6 C   Resp 20   Ht 5\' 9"  (1.753 m)   Wt 172 lb 9.6 oz (78.3 kg)   SpO2 96%   BMI 25.49 kg/m   EKG 06/27/16: NSR, ST/T wave abnormality, consider anterior ischemia. Currently, there is not a comparison EKG tracing in [facility] ([facility]) or [facility]. He denied CP at PAT.   CXR 06/27/16: IMPRESSION: No edema or consolidation.  Preoperative labs noted. Cr 1.01, glucose 119. H/H 15.6/45.6. PLT 255. PT/PTT WNL.  I contacted Dr. Mathews RobinsonsShelton's office to see if they have copies of patient's cardiology records from ~ 8 years ago. I was hoping to see if EKG findings are old or new. If EKG changes are new, then would consider if he needs referral to cardiology preoperatively. I will fax PAT results to Dr. Renae GlossShelton to review and staff will follow-up with her regarding if she has copies of his old cardiology records.   Velna Ochsllison Roan [place]uk, PA-C [facility]est[facility]iology Phone [phone] or [phone] 06/28/2016 4:07 PM  Addendum: I followed up with Dr. Mathews RobinsonsShelton's office. She reviewed and notified staff that she is no longer his PCP because he did not transfer his records to her new practice [facility]. Staff advised to contact Dr. Mathews RobinsonsShelton's previous practice [facility]. [facility]. They did have an EKG from 06/11/10 that showed SR, voltage criteria for LVH, non-specific T wave abnormalities.   06/27/16  and 06/11/10 EKGs reviewed with anesthesiologist Dr. Hart RochesterHollis who considered that ST changes may be due to LVH. Clinically correlate. If patient asymptomatic from a CV standpoint with reasonable recent activity tolerance then it is anticipated that he can proceed as planned. I left voice message for patient to contact me, but if he does not call back then anesthesiologist will clinically correlate on the day of surgery. I have updated Olegario MessierKathy at Dr. Nolon Nationsalldorf's office. She reported that patient told them that he is currently seeing Dr. Claudie ReveringAvubere as his PCP.  ID is Dr. Daiva EvesVan Dam.  Velna Ochsllison Loeta Herst, PA-C [facility]iology Phone [phone] 06/30/2016 4:49 PM

## 2016-07-05 ENCOUNTER — Inpatient Hospital Stay (HOSPITAL_COMMUNITY): Payer: Medicare Other

## 2016-07-05 ENCOUNTER — Inpatient Hospital Stay (HOSPITAL_COMMUNITY)
Admission: RE | Admit: 2016-07-05 | Discharge: 2016-07-06 | DRG: 470 | Disposition: A | Discharge status: Home Health | Payer: Medicare Other | Source: Ambulatory Visit | Attending: Orthopaedic Surgery | Admitting: Orthopaedic Surgery

## 2016-07-05 ENCOUNTER — Other Ambulatory Visit (HOSPITAL_COMMUNITY): Payer: Self-pay | Admitting: *Deleted

## 2016-07-05 ENCOUNTER — Inpatient Hospital Stay (HOSPITAL_COMMUNITY): Payer: Medicare Other | Admitting: Vascular Surgery

## 2016-07-05 ENCOUNTER — Encounter (HOSPITAL_COMMUNITY): Payer: Self-pay | Admitting: *Deleted

## 2016-07-05 ENCOUNTER — Encounter (HOSPITAL_COMMUNITY)
Admission: RE | Disposition: A | Discharge status: Home Health | Payer: Self-pay | Source: Ambulatory Visit | Attending: Orthopaedic Surgery

## 2016-07-05 DIAGNOSIS — F17210 Nicotine dependence, cigarettes, uncomplicated: Secondary | ICD-10-CM | POA: Diagnosis present

## 2016-07-05 DIAGNOSIS — M810 Age-related osteoporosis without current pathological fracture: Secondary | ICD-10-CM | POA: Diagnosis present

## 2016-07-05 DIAGNOSIS — Z21 Asymptomatic human immunodeficiency virus [HIV] infection status: Secondary | ICD-10-CM | POA: Diagnosis present

## 2016-07-05 DIAGNOSIS — E785 Hyperlipidemia, unspecified: Secondary | ICD-10-CM | POA: Diagnosis present

## 2016-07-05 DIAGNOSIS — I10 Essential (primary) hypertension: Secondary | ICD-10-CM | POA: Diagnosis present

## 2016-07-05 DIAGNOSIS — H409 Unspecified glaucoma: Secondary | ICD-10-CM | POA: Diagnosis present

## 2016-07-05 DIAGNOSIS — M25552 Pain in left hip: Secondary | ICD-10-CM | POA: Diagnosis present

## 2016-07-05 DIAGNOSIS — F410 Panic disorder [episodic paroxysmal anxiety] without agoraphobia: Secondary | ICD-10-CM | POA: Diagnosis present

## 2016-07-05 DIAGNOSIS — Z7982 Long term (current) use of aspirin: Secondary | ICD-10-CM

## 2016-07-05 DIAGNOSIS — J45909 Unspecified asthma, uncomplicated: Secondary | ICD-10-CM | POA: Diagnosis present

## 2016-07-05 DIAGNOSIS — Z96641 Presence of right artificial hip joint: Secondary | ICD-10-CM | POA: Diagnosis present

## 2016-07-05 DIAGNOSIS — M1612 Unilateral primary osteoarthritis, left hip: Secondary | ICD-10-CM | POA: Diagnosis present

## 2016-07-05 DIAGNOSIS — Z419 Encounter for procedure for purposes other than remedying health state, unspecified: Secondary | ICD-10-CM

## 2016-07-05 HISTORY — DX: Unilateral primary osteoarthritis, left hip: M16.12

## 2016-07-05 HISTORY — PX: TOTAL HIP ARTHROPLASTY: SHX124

## 2016-07-05 SURGERY — ARTHROPLASTY, HIP, TOTAL, ANTERIOR APPROACH
Anesthesia: Spinal | Laterality: Left

## 2016-07-05 MED ORDER — PHENYLEPHRINE HCL 10 MG/ML IJ SOLN
INTRAVENOUS | Status: DC | PRN
  Administered 2016-07-05: 15 ug/min via INTRAVENOUS

## 2016-07-05 MED ORDER — SODIUM CHLORIDE 0.9 % IV SOLN
1000.0000 mg | Freq: Once | INTRAVENOUS | Status: AC
  Administered 2016-07-05: 1000 mg via INTRAVENOUS
  Filled 2016-07-05: qty 10

## 2016-07-05 MED ORDER — EMTRICITABINE-TENOFOVIR AF 200-25 MG PO TABS
1.0000 | ORAL_TABLET | Freq: Every day | ORAL | Status: DC
  Administered 2016-07-05 – 2016-07-06 (×2): 1 via ORAL
  Filled 2016-07-05 (×2): qty 1

## 2016-07-05 MED ORDER — BUPIVACAINE LIPOSOME 1.3 % IJ SUSP
20.0000 mL | INTRAMUSCULAR | Status: AC
  Administered 2016-07-05: 20 mL
  Filled 2016-07-05: qty 20

## 2016-07-05 MED ORDER — LACTATED RINGERS IV SOLN
INTRAVENOUS | Status: DC
  Administered 2016-07-05: 18:00:00 via INTRAVENOUS

## 2016-07-05 MED ORDER — METHOCARBAMOL 1000 MG/10ML IJ SOLN
500.0000 mg | Freq: Four times a day (QID) | INTRAVENOUS | Status: DC | PRN
  Filled 2016-07-05: qty 5

## 2016-07-05 MED ORDER — 0.9 % SODIUM CHLORIDE (POUR BTL) OPTIME
TOPICAL | Status: DC | PRN
  Administered 2016-07-05: 1000 mL

## 2016-07-05 MED ORDER — METOCLOPRAMIDE HCL 5 MG/ML IJ SOLN: 5.0000 mg | Freq: Three times a day (TID) | INTRAMUSCULAR | Status: DC | PRN

## 2016-07-05 MED ORDER — ALUM & MAG HYDROXIDE-SIMETH 200-200-20 MG/5ML PO SUSP: 30.0000 mL | ORAL | Status: DC | PRN

## 2016-07-05 MED ORDER — FENTANYL CITRATE (PF) 100 MCG/2ML IJ SOLN
INTRAMUSCULAR | Status: DC | PRN
  Administered 2016-07-05: 100 ug via INTRAVENOUS

## 2016-07-05 MED ORDER — DOCUSATE SODIUM 100 MG PO CAPS
100.0000 mg | ORAL_CAPSULE | Freq: Two times a day (BID) | ORAL | Status: DC
  Administered 2016-07-05 – 2016-07-06 (×2): 100 mg via ORAL
  Filled 2016-07-05 (×2): qty 1

## 2016-07-05 MED ORDER — HYDROCODONE-ACETAMINOPHEN 5-325 MG PO TABS
1.0000 | ORAL_TABLET | ORAL | Status: DC | PRN
  Administered 2016-07-05: 1 via ORAL
  Administered 2016-07-05 – 2016-07-06 (×3): 2 via ORAL
  Filled 2016-07-05 (×3): qty 2

## 2016-07-05 MED ORDER — HYDROMORPHONE HCL 2 MG/ML IJ SOLN
0.5000 mg | INTRAMUSCULAR | Status: DC | PRN
  Administered 2016-07-05 – 2016-07-06 (×2): 1 mg via INTRAVENOUS
  Filled 2016-07-05 (×2): qty 1

## 2016-07-05 MED ORDER — HYDROMORPHONE HCL 1 MG/ML IJ SOLN
0.2500 mg | INTRAMUSCULAR | Status: DC | PRN
  Administered 2016-07-05 (×3): 0.5 mg via INTRAVENOUS

## 2016-07-05 MED ORDER — PROPOFOL 10 MG/ML IV BOLUS
INTRAVENOUS | Status: DC | PRN
  Administered 2016-07-05: 30 mg via INTRAVENOUS

## 2016-07-05 MED ORDER — HYDROCODONE-ACETAMINOPHEN 5-325 MG PO TABS
ORAL_TABLET | ORAL | Status: AC
  Filled 2016-07-05: qty 1

## 2016-07-05 MED ORDER — ACETAMINOPHEN 650 MG RE SUPP: 650.0000 mg | Freq: Four times a day (QID) | RECTAL | Status: DC | PRN

## 2016-07-05 MED ORDER — CEFAZOLIN SODIUM-DEXTROSE 2-4 GM/100ML-% IV SOLN
2.0000 g | Freq: Four times a day (QID) | INTRAVENOUS | Status: AC
  Administered 2016-07-05 – 2016-07-06 (×2): 2 g via INTRAVENOUS
  Filled 2016-07-05 (×3): qty 100

## 2016-07-05 MED ORDER — ONDANSETRON HCL 4 MG/2ML IJ SOLN: 4.0000 mg | Freq: Four times a day (QID) | INTRAMUSCULAR | Status: DC | PRN

## 2016-07-05 MED ORDER — HYDROMORPHONE HCL 1 MG/ML IJ SOLN
INTRAMUSCULAR | Status: AC
  Filled 2016-07-05: qty 0.5

## 2016-07-05 MED ORDER — HYDROMORPHONE HCL 1 MG/ML IJ SOLN
INTRAMUSCULAR | Status: AC
  Filled 2016-07-05: qty 1

## 2016-07-05 MED ORDER — MIDAZOLAM HCL 5 MG/5ML IJ SOLN
INTRAMUSCULAR | Status: DC | PRN
  Administered 2016-07-05: 2 mg via INTRAVENOUS

## 2016-07-05 MED ORDER — DIPHENHYDRAMINE HCL 12.5 MG/5ML PO ELIX: 12.5000 mg | ORAL_SOLUTION | ORAL | Status: DC | PRN

## 2016-07-05 MED ORDER — HYDROMORPHONE HCL 1 MG/ML IJ SOLN: 0.5000 mg | INTRAMUSCULAR | Status: DC | PRN

## 2016-07-05 MED ORDER — ACETAMINOPHEN 325 MG PO TABS: 650.0000 mg | ORAL_TABLET | Freq: Four times a day (QID) | ORAL | Status: DC | PRN

## 2016-07-05 MED ORDER — DARUNAVIR-COBICISTAT 800-150 MG PO TABS
1.0000 | ORAL_TABLET | Freq: Every day | ORAL | Status: DC
  Administered 2016-07-05 – 2016-07-06 (×2): 1 via ORAL
  Filled 2016-07-05 (×2): qty 1

## 2016-07-05 MED ORDER — PRAVASTATIN SODIUM 40 MG PO TABS
40.0000 mg | ORAL_TABLET | Freq: Every day | ORAL | Status: DC
  Administered 2016-07-05 – 2016-07-06 (×2): 40 mg via ORAL
  Filled 2016-07-05 (×2): qty 1

## 2016-07-05 MED ORDER — MOMETASONE FUROATE 50 MCG/ACT NA SUSP
2.0000 | Freq: Every day | NASAL | Status: DC | PRN
  Filled 2016-07-05: qty 17

## 2016-07-05 MED ORDER — MENTHOL 3 MG MT LOZG: 1.0000 | LOZENGE | OROMUCOSAL | Status: DC | PRN

## 2016-07-05 MED ORDER — LORATADINE 10 MG PO TABS
10.0000 mg | ORAL_TABLET | Freq: Every day | ORAL | Status: DC
Start: 2016-07-05 — End: 2016-07-06
  Administered 2016-07-05 – 2016-07-06 (×2): 10 mg via ORAL
  Filled 2016-07-05 (×2): qty 1

## 2016-07-05 MED ORDER — PROPOFOL 10 MG/ML IV BOLUS
INTRAVENOUS | Status: AC
  Filled 2016-07-05: qty 20

## 2016-07-05 MED ORDER — LACTATED RINGERS IV SOLN
INTRAVENOUS | Status: DC
  Administered 2016-07-05 (×2): via INTRAVENOUS

## 2016-07-05 MED ORDER — TRANEXAMIC ACID 1000 MG/10ML IV SOLN
1000.0000 mg | INTRAVENOUS | Status: AC
  Administered 2016-07-05: 1000 mg via INTRAVENOUS
  Filled 2016-07-05: qty 10

## 2016-07-05 MED ORDER — BUPIVACAINE-EPINEPHRINE (PF) 0.5% -1:200000 IJ SOLN
INTRAMUSCULAR | Status: DC | PRN
  Administered 2016-07-05: 20 mL

## 2016-07-05 MED ORDER — FLUNISOLIDE 25 MCG/ACT (0.025%) NA SOLN
2.0000 | Freq: Two times a day (BID) | NASAL | Status: DC | PRN
  Filled 2016-07-05: qty 25

## 2016-07-05 MED ORDER — AMLODIPINE BESYLATE 5 MG PO TABS
5.0000 mg | ORAL_TABLET | Freq: Every day | ORAL | Status: DC
Start: 2016-07-05 — End: 2016-07-06
  Administered 2016-07-05 – 2016-07-06 (×2): 5 mg via ORAL
  Filled 2016-07-05 (×2): qty 1

## 2016-07-05 MED ORDER — CEFAZOLIN SODIUM-DEXTROSE 2-4 GM/100ML-% IV SOLN
INTRAVENOUS | Status: AC
  Filled 2016-07-05: qty 100

## 2016-07-05 MED ORDER — BUPIVACAINE-EPINEPHRINE (PF) 0.5% -1:200000 IJ SOLN
INTRAMUSCULAR | Status: AC
  Filled 2016-07-05: qty 30

## 2016-07-05 MED ORDER — FENTANYL CITRATE (PF) 100 MCG/2ML IJ SOLN
INTRAMUSCULAR | Status: AC
  Filled 2016-07-05: qty 2

## 2016-07-05 MED ORDER — BUPIVACAINE HCL (PF) 0.5 % IJ SOLN
INTRAMUSCULAR | Status: DC | PRN
  Administered 2016-07-05: 3 mL via INTRATHECAL

## 2016-07-05 MED ORDER — METOCLOPRAMIDE HCL 5 MG PO TABS: 5.0000 mg | ORAL_TABLET | Freq: Three times a day (TID) | ORAL | Status: DC | PRN

## 2016-07-05 MED ORDER — CEFAZOLIN SODIUM-DEXTROSE 2-4 GM/100ML-% IV SOLN
2.0000 g | INTRAVENOUS | Status: AC
  Administered 2016-07-05: 2 g via INTRAVENOUS

## 2016-07-05 MED ORDER — TRANEXAMIC ACID 1000 MG/10ML IV SOLN
2000.0000 mg | INTRAVENOUS | Status: AC
  Administered 2016-07-05: 2000 mg via TOPICAL
  Filled 2016-07-05: qty 20

## 2016-07-05 MED ORDER — OXYCODONE HCL 5 MG PO TABS: 5.0000 mg | ORAL_TABLET | Freq: Once | ORAL | Status: DC | PRN

## 2016-07-05 MED ORDER — PHENOL 1.4 % MT LIQD: 1.0000 | OROMUCOSAL | Status: DC | PRN

## 2016-07-05 MED ORDER — CHLORHEXIDINE GLUCONATE 4 % EX LIQD: 60.0000 mL | Freq: Once | CUTANEOUS | Status: DC

## 2016-07-05 MED ORDER — BISACODYL 5 MG PO TBEC: 5.0000 mg | DELAYED_RELEASE_TABLET | Freq: Every day | ORAL | Status: DC | PRN

## 2016-07-05 MED ORDER — ONDANSETRON HCL 4 MG PO TABS: 4.0000 mg | ORAL_TABLET | Freq: Four times a day (QID) | ORAL | Status: DC | PRN

## 2016-07-05 MED ORDER — METHOCARBAMOL 500 MG PO TABS
500.0000 mg | ORAL_TABLET | Freq: Four times a day (QID) | ORAL | Status: DC | PRN
  Administered 2016-07-05 – 2016-07-06 (×2): 500 mg via ORAL
  Filled 2016-07-05 (×2): qty 1

## 2016-07-05 MED ORDER — MIDAZOLAM HCL 2 MG/2ML IJ SOLN
INTRAMUSCULAR | Status: AC
  Filled 2016-07-05: qty 2

## 2016-07-05 MED ORDER — LIDOCAINE HCL (CARDIAC) 20 MG/ML IV SOLN
INTRAVENOUS | Status: DC | PRN
  Administered 2016-07-05: 100 mg via INTRATRACHEAL

## 2016-07-05 MED ORDER — PROPOFOL 500 MG/50ML IV EMUL
INTRAVENOUS | Status: DC | PRN
  Administered 2016-07-05: 50 ug/kg/min via INTRAVENOUS

## 2016-07-05 MED ORDER — ASPIRIN EC 325 MG PO TBEC
325.0000 mg | DELAYED_RELEASE_TABLET | Freq: Two times a day (BID) | ORAL | Status: DC
  Administered 2016-07-06: 325 mg via ORAL
  Filled 2016-07-05: qty 1

## 2016-07-05 MED ORDER — OXYCODONE HCL 5 MG/5ML PO SOLN
5.0000 mg | Freq: Once | ORAL | Status: DC | PRN
Start: 2016-07-05 — End: 2016-07-05

## 2016-07-05 MED ORDER — LACTATED RINGERS IV SOLN: INTRAVENOUS | Status: DC

## 2016-07-05 SURGICAL SUPPLY — 44 items
BLADE SAW SGTL 18X1.27X75 (BLADE) ×2 IMPLANT
BLADE SAW SGTL 18X1.27X75MM (BLADE) ×1
CAPT HIP TOTAL 2 ×2 IMPLANT
CELLS DAT CNTRL 66122 CELL SVR (MISCELLANEOUS) ×1 IMPLANT
COVER PERINEAL POST (MISCELLANEOUS) ×3 IMPLANT
COVER SURGICAL LIGHT HANDLE (MISCELLANEOUS) ×3 IMPLANT
DRAPE C-ARM 42X72 X-RAY (DRAPES) ×3 IMPLANT
DRAPE STERI IOBAN 125X83 (DRAPES) ×3 IMPLANT
DRAPE U-SHAPE 47X51 STRL (DRAPES) ×9 IMPLANT
DRSG AQUACEL AG ADV 3.5X10 (GAUZE/BANDAGES/DRESSINGS) ×3 IMPLANT
DURAPREP 26ML APPLICATOR (WOUND CARE) ×3 IMPLANT
ELECT BLADE 4.0 EZ CLEAN MEGAD (MISCELLANEOUS)
ELECT REM PT RETURN 9FT ADLT (ELECTROSURGICAL) ×3
ELECTRODE BLDE 4.0 EZ CLN MEGD (MISCELLANEOUS) IMPLANT
ELECTRODE REM PT RTRN 9FT ADLT (ELECTROSURGICAL) ×1 IMPLANT
FACESHIELD WRAPAROUND (MASK) ×6 IMPLANT
FACESHIELD WRAPAROUND OR TEAM (MASK) ×2 IMPLANT
GLOVE BIO SURGEON STRL SZ8 (GLOVE) ×6 IMPLANT
GLOVE BIOGEL PI IND STRL 8 (GLOVE) ×2 IMPLANT
GLOVE BIOGEL PI INDICATOR 8 (GLOVE) ×4
GOWN STRL REUS W/ TWL LRG LVL3 (GOWN DISPOSABLE) ×1 IMPLANT
GOWN STRL REUS W/ TWL XL LVL3 (GOWN DISPOSABLE) ×2 IMPLANT
GOWN STRL REUS W/TWL LRG LVL3 (GOWN DISPOSABLE) ×3
GOWN STRL REUS W/TWL XL LVL3 (GOWN DISPOSABLE) ×6
KIT BASIN OR (CUSTOM PROCEDURE TRAY) ×3 IMPLANT
KIT ROOM TURNOVER OR (KITS) ×3 IMPLANT
MANIFOLD NEPTUNE II (INSTRUMENTS) ×3 IMPLANT
NEEDLE HYPO 22GX1.5 SAFETY (NEEDLE) ×3 IMPLANT
NS IRRIG 1000ML POUR BTL (IV SOLUTION) ×3 IMPLANT
PACK TOTAL JOINT (CUSTOM PROCEDURE TRAY) ×3 IMPLANT
PAD ARMBOARD 7.5X6 YLW CONV (MISCELLANEOUS) ×3 IMPLANT
RETRACTOR WND ALEXIS 18 MED (MISCELLANEOUS) ×1 IMPLANT
RTRCTR WOUND ALEXIS 18CM MED (MISCELLANEOUS) ×3
SUT ETHIBOND NAB CT1 #1 30IN (SUTURE) ×6 IMPLANT
SUT MNCRL AB 3-0 PS2 27 (SUTURE) ×3 IMPLANT
SUT VIC AB 1 CT1 27 (SUTURE) ×3
SUT VIC AB 1 CT1 27XBRD ANBCTR (SUTURE) ×1 IMPLANT
SUT VIC AB 2-0 CT1 27 (SUTURE) ×3
SUT VIC AB 2-0 CT1 TAPERPNT 27 (SUTURE) ×1 IMPLANT
SUT VLOC 180 0 24IN GS25 (SUTURE) ×3 IMPLANT
SYR 50ML LL SCALE MARK (SYRINGE) ×3 IMPLANT
TOWEL OR 17X24 6PK STRL BLUE (TOWEL DISPOSABLE) ×3 IMPLANT
TOWEL OR 17X26 10 PK STRL BLUE (TOWEL DISPOSABLE) ×3 IMPLANT
TRAY CATH 16FR W/PLASTIC CATH (SET/KITS/TRAYS/PACK) IMPLANT

## 2016-07-05 NOTE — Transfer of Care (Signed)
Immediate Anesthesia Transfer of Care Note  Patient: Nicoletta DressJerry L Wentworth  Procedure(s) Performed: Procedure(s): TOTAL HIP ARTHROPLASTY ANTERIOR APPROACH (Left)  Patient Location: PACU  Anesthesia Type:Spinal  Level of Consciousness: awake, alert  and oriented  Airway & Oxygen Therapy: Patient Spontanous Breathing and Patient connected to nasal cannula oxygen  Post-op Assessment: Report given to RN and Post -op Vital signs reviewed and stable  Post vital signs: Reviewed and stable  Last Vitals:  Vitals:   07/05/16 0814  BP: 131/69  Pulse: 69  Resp: 20  Temp: 36.4 C    Last Pain:  Vitals:   07/05/16 0830  TempSrc:   PainSc: 4       Patients Stated Pain Goal: 4 (07/05/16 0830)  Complications: No apparent anesthesia complications

## 2016-07-05 NOTE — Op Note (Signed)
PRE-OP DIAGNOSIS:  LEFT HIP DEGENERATIVE JOINT DISEASE POST-OP DIAGNOSIS: same PROCEDURE:  LEFT TOTAL HIP ARTHROPLASTY ANTERIOR APPROACH ANESTHESIA:  Spinal and MAC SURGEON:  Melrose Nakayama MD ASSISTANT:  Loni Dolly PA-C   INDICATIONS FOR PROCEDURE:  The patient is a 66 y.o. male with a long history of a painful hip.  This has persisted despite multiple conservative measures.  The patient has persisted with pain and dysfunction making rest and activity difficult.  A total hip replacement is offered as surgical treatment.  Informed operative consent was obtained after discussion of possible complications including reaction to anesthesia, infection, neurovascular injury, dislocation, DVT, PE, and death.  The importance of the postoperative rehab program to optimize result was stressed with the patient.  SUMMARY OF FINDINGS AND PROCEDURE:  Under general anesthesia through a anterior approach an the Hana table a left THR was performed.  The patient had severe degenerative change and excellent bone quality.  We used DePuy components to replace the hip and these were size KLA 11 Corail femur capped with a +1.5 46m ceramic hip ball.  On the acetabular side we used a size 52 Gription shell with a plus 4 neutral polyethylene liner.  We did use a hole eliminator.  ALoni DollyPA-C assisted throughout and was invaluable to the completion of the case in that he helped position and retract while I performed the procedure.  He also closed simultaneously to help minimize OR time.  I used fluoroscopy throughout the case to check position of components and leg lengths and read all these views myself.  DESCRIPTION OF PROCEDURE:  The patient was taken to the OR suite where general anesthetic was applied.  The patient was then positioned on the Hana table supine.  All bony prominences were appropriately padded.  Prep and drape was then performed in normal sterile fashion.  The patient was given kefzol preoperative  antibiotic and an appropriate time out was performed.  We then took an anterior approach to the left hip.  Dissection was taken through adipose to the tensor fascia lata fascia.  This structure was incised longitudinally and we dissected in the intermuscular interval just medial to this muscle.  Cobra retractors were placed superior and inferior to the femoral neck superficial to the capsule.  A capsular incision was then made and the retractors were placed along the femoral neck.  Xray was brought in to get a good level for the femoral neck cut which was made with an oscillating saw and osteotome.  The femoral head was removed with a corkscrew.  The acetabulum was exposed and some labral tissues were excised. Reaming was taken to the inside wall of the pelvis and sequentially up to 1 mm smaller than the actual component.  A trial of components was done and then the aforementioned acetabular shell was placed in appropriate tilt and anteversion confirmed by fluoroscopy. The liner was placed along with the hole eliminator and attention was turned to the femur.  The leg was brought down and over into adduction and the elevator bar was used to raise the femur up gently in the wound.  The piriformis was released with care taken to preserve the obturator internus attachment and all of the posterior capsule. The femur was reamed and then broached to the appropriate size.  A trial reduction was done and the aforementioned head and neck assembly gave uKoreathe best stability in extension with external rotation.  Leg lengths were felt to be about equal by fluoroscopic exam.  The trial components were removed and the wound irrigated.  We then placed the femoral component in appropriate anteversion.  The head was applied to a dry stem neck and the hip again reduced.  It was again stable in the aforementioned position.  The would was irrigated again followed by re-approximation of anterior capsule with ethibond suture. Tensor  fascia was repaired with V-loc suture  followed by deep closure with #O and #2 undyed vicryl.  Skin was closed with subQ stitch and steristrips followed by a sterile dressing.  EBL and IOF can be obtained from anesthesia records.  DISPOSITION:  The patient was extubated in the OR and taken to PACU in stable condition to be admitted to the Orthopedic Surgery for appropriate post-op care to include perioperative antibiotics and DVT prophylaxis.

## 2016-07-05 NOTE — Anesthesia Preprocedure Evaluation (Signed)
Anesthesia Evaluation  Patient identified by MRN, date of birth, ID band Patient awake    Reviewed: Allergy & Precautions, NPO status , Patient's Chart, lab work & pertinent test results  Airway Mallampati: I  TM Distance: >3 FB Neck ROM: Full    Dental   Pulmonary Current Smoker,    Pulmonary exam normal        Cardiovascular hypertension, Pt. on medications Normal cardiovascular exam     Neuro/Psych    GI/Hepatic   Endo/Other    Renal/GU      Musculoskeletal   Abdominal   Peds  Hematology   Anesthesia Other Findings   Reproductive/Obstetrics                             Anesthesia Physical Anesthesia Plan  ASA: II  Anesthesia Plan: Spinal and MAC   Post-op Pain Management:    Induction: Intravenous  Airway Management Planned: Simple Face Mask  Additional Equipment:   Intra-op Plan:   Post-operative Plan:   Informed Consent: I have reviewed the patients History and Physical, chart, labs and discussed the procedure including the risks, benefits and alternatives for the proposed anesthesia with the patient or authorized representative who has indicated his/her understanding and acceptance.     Plan Discussed with: CRNA and Surgeon  Anesthesia Plan Comments:         Anesthesia Quick Evaluation

## 2016-07-05 NOTE — Interval H&P Note (Signed)
History and Physical Interval Note:  07/05/2016 9:17 AM  Wayne DressJerry L Mccarrell  has presented today for surgery, with the diagnosis of LEFT HIP DEGENERATIVE JOINT DISEASE  The various methods of treatment have been discussed with the patient and family. After consideration of risks, benefits and other options for treatment, the patient has consented to  Procedure(s): TOTAL HIP ARTHROPLASTY ANTERIOR APPROACH (Left) as a surgical intervention .  The patient's history has been reviewed, patient examined, no change in status, stable for surgery.  I have reviewed the patient's chart and labs.  Questions were answered to the patient's satisfaction.     [name]

## 2016-07-05 NOTE — Anesthesia Postprocedure Evaluation (Signed)
Anesthesia Post Note  Patient: Wayne Santos  Procedure(s) Performed: Procedure(s) (LRB): TOTAL HIP ARTHROPLASTY ANTERIOR APPROACH (Left)  Patient location during evaluation: PACU Anesthesia Type: Spinal Level of consciousness: oriented and awake and alert Pain management: pain level controlled Vital Signs Assessment: post-procedure vital signs reviewed and stable Respiratory status: spontaneous breathing, respiratory function stable and patient connected to nasal cannula oxygen Cardiovascular status: blood pressure returned to baseline and stable Postop Assessment: no headache and no backache Anesthetic complications: no       Last Vitals:  Vitals:   07/05/16 1545 07/05/16 1600  BP: 126/78 139/78  Pulse: 70 64  Resp: 15 14  Temp:      Last Pain:  Vitals:   07/05/16 1545  TempSrc:   PainSc: 7                  Zenda Herskowitz DAVID

## 2016-07-05 NOTE — Anesthesia Procedure Notes (Signed)
Spinal  Patient location during procedure: OR Start time: 07/05/2016 10:20 AM End time: 07/05/2016 10:25 AM Staffing Anesthesiologist: Arta BruceSSEY, Shelton Soler Performed: anesthesiologist  Preanesthetic Checklist Completed: patient identified, surgical consent, pre-op evaluation, timeout performed, IV checked, risks and benefits discussed and monitors and equipment checked Spinal Block Patient position: sitting Prep: DuraPrep Patient monitoring: heart rate, cardiac monitor, continuous pulse ox and blood pressure Approach: right paramedian Location: L3-4 Injection technique: single-shot Needle Needle type: Pencan  Needle gauge: 24 G Needle length: 9 cm Needle insertion depth: 7 cm

## 2016-07-06 ENCOUNTER — Encounter (HOSPITAL_COMMUNITY): Payer: Self-pay | Admitting: Orthopaedic Surgery

## 2016-07-06 MED ORDER — BISACODYL 5 MG PO TBEC
5.0000 mg | DELAYED_RELEASE_TABLET | Freq: Every day | ORAL | 0 refills | Status: DC | PRN
Start: 2016-07-06 — End: 2016-08-24

## 2016-07-06 MED ORDER — METHOCARBAMOL 500 MG PO TABS: 500.0000 mg | ORAL_TABLET | Freq: Four times a day (QID) | ORAL | 0 refills | Status: DC | PRN

## 2016-07-06 MED ORDER — HYDROCODONE-ACETAMINOPHEN 5-325 MG PO TABS: 1.0000 | ORAL_TABLET | ORAL | 0 refills | Status: DC | PRN

## 2016-07-06 MED ORDER — ASPIRIN 325 MG PO TBEC: 325.0000 mg | DELAYED_RELEASE_TABLET | Freq: Two times a day (BID) | ORAL | 0 refills | Status: DC

## 2016-07-06 MED ORDER — DOCUSATE SODIUM 100 MG PO CAPS: 100.0000 mg | ORAL_CAPSULE | Freq: Two times a day (BID) | ORAL | 0 refills | Status: DC

## 2016-07-06 NOTE — Progress Notes (Signed)
Physical Therapy Treatment Patient Details Name: Wayne Santos MRN: [id number] DOB: 03/08/1951 Today's Date: 07/06/2016    History of Present Illness Admitted for LTHA, Direct Ant; WBAT;  has a pertinent past medical history of Anemia; Asthma; AVN (avascular necrosis of bone) (HCC); Benign paroxysmal positional vertigo (06/15/2015); Encounter for long-term (current) use of medications (05/18/2016); Glaucoma; Headache; HIV infection (HCC);  Hypertension; Memory impairment (06/15/2015); Osteoporosis;  has a past surgical history that includes Joint replacement; Total hip arthroplasty (Right); and Hand nerve repair (Right).   PT Comments    Pt making progress with mobility. Worked on amb, Museum/gallery curator and educ for therex; continues to move well overall. Ok for d/c home today. Pt will benefit from continued acute PT to maximize functional mobility and independence.   Follow Up Recommendations  Home health PT     Equipment Recommendations  Rolling walker with 5" wheels;3in1 (PT)    Recommendations for Other Services OT consult     Precautions / Restrictions Precautions Precautions: None Restrictions Weight Bearing Restrictions: Yes LLE Weight Bearing: Weight bearing as tolerated    Mobility  Bed Mobility     General bed mobility comments: Pt received sitting in chair  Transfers Overall transfer level: Needs assistance Equipment used: Rolling walker (2 wheeled) Transfers: Sit to/from Stand Sit to Stand: Supervision Stand pivot transfers: Supervision       General transfer comment: Cues for hand placement  Ambulation/Gait Ambulation/Gait assistance: Supervision Ambulation Distance (Feet): 225 Feet Assistive device: Rolling walker (2 wheeled) Gait Pattern/deviations: Step-through pattern;Decreased stride length;Decreased stance time - left     General Gait Details: Cues for upright posture   Stairs Stairs: Yes   Stair Management: One rail Left Number of Stairs:  3 General stair comments: Ascend/descended 3 stairs x2; practiced two different techniques, one forwards and one sideways, pt more comfortable with sideways technique. Cues for sequencing.   Wheelchair Mobility    Modified Rankin (Stroke Patients Only)       Balance Overall balance assessment: Needs assistance Sitting-balance support: No upper extremity supported;Feet supported Sitting balance-Leahy Scale: Normal     Standing balance support: During functional activity;Bilateral upper extremity supported;No upper extremity supported Standing balance-Leahy Scale: Fair                      Cognition Arousal/Alertness: Awake/alert Behavior During Therapy: WFL for tasks assessed/performed Overall Cognitive Status: Within Functional Limits for tasks assessed                      Exercises      General Comments        Pertinent Vitals/Pain Pain Assessment: 0-10 Pain Score: 2  Pain Location: L hip Pain Descriptors / Indicators: Discomfort Pain Intervention(s): Monitored during session;Premedicated before session    Home Living Family/patient expects to be discharged to:: Private residence Living Arrangements: Alone Available Help at Discharge: Friend(s);Family;Available PRN/intermittently Type of Home: House Home Access: Stairs to enter Entrance Stairs-Rails: Right;Left Home Layout: One level Home Equipment: Crutches;Walker - standard;Adaptive equipment      Prior Function Level of Independence: Independent          PT Goals (current goals can now be found in the care plan section) Acute Rehab PT Goals Patient Stated Goal: go home today PT Goal Formulation: With patient Time For Goal Achievement: 07/13/16 Potential to Achieve Goals: Good Progress towards PT goals: Progressing toward goals    Frequency    7X/week  PT Plan Current plan remains appropriate    Co-evaluation             End of Session Equipment Utilized During  Treatment: Gait belt Activity Tolerance: Patient tolerated treatment well Patient left: in chair;with call bell/phone within reach Nurse Communication: Mobility status PT Visit Diagnosis: Pain Pain - Right/Left: Left Pain - part of body: Hip     Time: 1411-1441 PT Time Calculation (min) (ACUTE ONLY): 30 minutes Charges: 1 gait training, 1 therapeutic activity                   G Codes:      Dewayne HatchJaclyn Leigh Berwyn Bigley, SPT Office-[phone]  Ina HomesJaclyn Kameran Lallier 07/06/2016, 4:32 PM

## 2016-07-06 NOTE — Evaluation (Signed)
Occupational Therapy Evaluation Patient Details Name: Wayne Santos MRN: [id number] DOB: 04/03/1951 Today's Date: 07/06/2016    History of Present Illness Admitted for LTHA, Direct Ant; WBAT;  has a pertinent past medical history of Anemia; Asthma; AVN (avascular necrosis of bone) (HCC); Benign paroxysmal positional vertigo (06/15/2015); Encounter for long-term (current) use of medications (05/18/2016); Glaucoma; Headache; HIV infection (HCC);  Hypertension; Memory impairment (06/15/2015); Osteoporosis;  has a past surgical history that includes Joint replacement; Total hip arthroplasty (Right); and Hand nerve repair (Right).   Clinical Impression   Pt is at min A level with LB ADLs and min guard A - sup with ADL mobility using RW. Pt reports that he will have assist at home from his cousin. Pt with necessary DME and A/E at home from previous hip surgery. No further acute OT is indicated at this time    Follow Up Recommendations  No OT follow up;Supervision - Intermittent    Equipment Recommendations  None recommended by OT    Recommendations for Other Services PT consult     Precautions / Restrictions Precautions Precautions: None Restrictions Weight Bearing Restrictions: Yes LLE Weight Bearing: Weight bearing as tolerated      Mobility Bed Mobility               General bed mobility comments: pt up in recliner upon arrival  Transfers Overall transfer level: Needs assistance Equipment used: Rolling walker (2 wheeled) Transfers: Sit to/from [employer] Transfers Sit to Stand: Min guard Stand pivot transfers: Supervision            Balance Overall balance assessment: Needs assistance Sitting-balance support: No upper extremity supported;Feet supported Sitting balance-Leahy Scale: Normal     Standing balance support: During functional activity;Single extremity supported;Bilateral upper extremity supported Standing balance-Leahy Scale: Fair                               ADL Overall ADL's : Needs assistance/impaired     Grooming: Wash/dry hands;Wash/dry face;Standing;Min guard   Upper Body Bathing: Set up;Sitting   Lower Body Bathing: Minimal assistance   Upper Body Dressing : Set up;Sitting   Lower Body Dressing: Minimal assistance   Toilet Transfer: Min guard;Supervision/safety;Ambulation;RW;Comfort height toilet;Grab bars   Toileting- Clothing Manipulation and Hygiene: Min guard;Sit to/from stand   Tub/ Shower Transfer: 3 in 1;Min guard;Supervision/safety;Rolling walker;Ambulation   Functional mobility during ADLs: Min guard;Supervision/safety;Rolling walker General ADL Comments: pt has ADL A/E at home from previous hip surgery     Vision   Vision Assessment?: No apparent visual deficits                Pertinent Vitals/Pain Pain Assessment: 0-10 Pain Score: 2  Pain Location: R hip Pain Descriptors / Indicators: Discomfort Pain Intervention(s): Monitored during session;Premedicated before session;Repositioned     Hand Dominance Right   Extremity/Trunk Assessment Upper Extremity Assessment Upper Extremity Assessment: Overall WFL for tasks assessed   Lower Extremity Assessment Lower Extremity Assessment: Defer to PT evaluation   Cervical / Trunk Assessment Cervical / Trunk Assessment: Normal   Communication Communication Communication: No difficulties   Cognition Arousal/Alertness: Awake/alert Behavior During Therapy: WFL for tasks assessed/performed Overall Cognitive Status: Within Functional Limits for tasks assessed                     General Comments   pt very pleasant and cooperative  Home Living Family/patient expects to be discharged to:: Private residence Living Arrangements: Alone Available Help at Discharge: Friend(s);Family;Available PRN/intermittently Type of Home: House Home Access: Stairs to enter Enter[employer] CorporationEntrance Stairs-Number of Steps: 2   Home  Layout: One level     Bathroom Shower/Tub: Chief Strategy OfficerTub/shower unit   Bathroom Toilet: Handicapped height     Home Equipment: Crutches;Walker - standard;Adaptive equipment Adaptive Equipment: Reacher;Long-handled shoe horn        Prior Functioning/Environment Level of Independence: Independent                 OT Problem List: Decreased activity tolerance;Pain;Impaired balance (sitting and/or standing)      OT Treatment/Interventions:      OT Goals(Current goals can be found in the care plan section) Acute Rehab OT Goals Patient Stated Goal: go home today OT Goal Formulation: With patient  OT Frequency:     Barriers to D/C:    no barriers                     End of Session Equipment Utilized During Treatment: Rolling walker;Other (comment) (3 in 1)  Activity Tolerance: Patient tolerated treatment well Patient left: in chair;with call bell/phone within reach                   ADL either performed or assessed with clinical judgement  Time: 1108-1129 OT Time Calculation (min): 21 min Charges:  OT General Charges $OT Visit: 1 Procedure OT Evaluation $OT Eval Low Complexity: 1 Procedure G-Codes: OT G-codes **NOT FOR INPATIENT CLASS** Functional Assessment Tool Used: AM-PAC 6 Clicks Daily Activity     Galen ManilaSpencer, Ameris Akamine Jeanette 07/06/2016, 12:53 PM

## 2016-07-06 NOTE — Evaluation (Signed)
Physical Therapy Evaluation Patient Details Name: Wayne Santos MRN: [id number] DOB: 1950/06/03 Today's Date: 07/06/2016   History of Present Illness  Admitted for LTHA, Direct Ant; WBAT;  has a pertinent past medical history of Anemia; Asthma; AVN (avascular necrosis of bone) (HCC); Benign paroxysmal positional vertigo (06/15/2015); Encounter for long-term (current) use of medications (05/18/2016); Glaucoma; Headache; HIV infection (HCC);  Hypertension; Memory impairment (06/15/2015); Osteoporosis;  has a past surgical history that includes Joint replacement; Total hip arthroplasty (Right); and Hand nerve repair (Right).  Clinical Impression   Pt is s/p THA resulting in the deficits listed below (see PT Problem List). Overall moving well; dc home today is not unreasonable; will plan for stair training in pm session;  Pt will benefit from skilled PT to increase their independence and safety with mobility to allow discharge to the venue listed below.      Follow Up Recommendations Home health PT    Equipment Recommendations  Rolling walker with 5" wheels;3in1 (PT)    Recommendations for Other Services OT consult     Precautions / Restrictions Precautions Precautions: None Restrictions Weight Bearing Restrictions: Yes LLE Weight Bearing: Weight bearing as tolerated      Mobility  Bed Mobility Overal bed mobility: Needs Assistance Bed Mobility: Supine to Sit     Supine to sit: Min guard     General bed mobility comments: Cues for technqiue  Transfers Overall transfer level: Needs assistance Equipment used: Rolling walker (2 wheeled) Transfers: Sit to/from Stand Sit to Stand: Min guard Stand pivot transfers: Supervision       General transfer comment: Cues for hand placement  Ambulation/Gait Ambulation/Gait assistance: Min guard Ambulation Distance (Feet): 120 Feet Assistive device: Rolling walker (2 wheeled) Gait Pattern/deviations: Step-to pattern;Step-through  pattern     General Gait Details: Cues to activate L gluteals for more upright posture, especially in L stance; cues to self-monitor for activity tolerance as well  Stairs            Wheelchair Mobility    Modified Rankin (Stroke Patients Only)       Balance Overall balance assessment: Needs assistance Sitting-balance support: No upper extremity supported;Feet supported Sitting balance-Leahy Scale: Normal     Standing balance support: During functional activity;Single extremity supported;Bilateral upper extremity supported Standing balance-Leahy Scale: Fair                               Pertinent Vitals/Pain Pain Assessment: 0-10 Pain Score: 5  Pain Location: L hip Pain Descriptors / Indicators: Discomfort Pain Intervention(s): Monitored during session;Premedicated before session    Home Living Family/patient expects to be discharged to:: Private residence Living Arrangements: Alone Available Help at Discharge: Friend(s);Family;Available PRN/intermittently Type of Home: House Home Access: Stairs to enter Entrance Stairs-Rails: Doctor, general practiceight;Left Entrance Stairs-Number of Steps: 2 Home Layout: One level Home Equipment: Crutches;Walker - standard;Adaptive equipment      Prior Function Level of Independence: Independent               Hand Dominance   Dominant Hand: Right    Extremity/Trunk Assessment   Upper Extremity Assessment Upper Extremity Assessment: Overall WFL for tasks assessed    Lower Extremity Assessment Lower Extremity Assessment: LLE deficits/detail LLE Deficits / Details: Grossly decr AROM and strength, limited by pain postop    Cervical / Trunk Assessment Cervical / Trunk Assessment: Normal  Communication   Communication: No difficulties  Cognition Arousal/Alertness: Awake/alert Behavior During Therapy: [facility] HospitalW[facility]  for tasks assessed/performed Overall Cognitive Status: Within Functional Limits for tasks assessed                       General Comments      Exercises     Assessment/Plan    PT Assessment Patient needs continued PT services  PT Problem List Decreased strength;Decreased range of motion;Decreased activity tolerance;Decreased mobility;Decreased knowledge of use of DME;Decreased knowledge of precautions;Pain       PT Treatment Interventions DME instruction;Gait training;Stair training;Functional mobility training;Therapeutic activities;Therapeutic exercise;Patient/family education    PT Goals (Current goals can be found in the Care Plan section)  Acute Rehab PT Goals Patient Stated Goal: go home today PT Goal Formulation: With patient Time For Goal Achievement: 07/13/16 Potential to Achieve Goals: Good    Frequency 7X/week   Barriers to discharge        Co-evaluation               End of Session   Activity Tolerance: Patient tolerated treatment well Patient left: in chair;with call bell/phone within reach Nurse Communication: Mobility status PT Visit Diagnosis: Pain Pain - Right/Left: Left Pain - part of body: Hip         Time: 1610-9604 PT Time Calculation (min) (ACUTE ONLY): 34 min   Charges:   PT Evaluation $PT Eval Low Complexity: 1 Procedure PT Treatments $Gait Training: 8-22 mins   PT G Codes:         Levi Aland 07/06/2016, 1:59 PM  Van Clines, PT  Acute Rehabilitation Services Pager [phone] Office [phone]

## 2016-07-06 NOTE — Progress Notes (Signed)
Subjective: 1 Day Post-Op Procedure(s) (LRB): TOTAL HIP ARTHROPLASTY ANTERIOR APPROACH (Left)   Patient doing great. He would like to go home today if possible.  Activity level:  wbat Diet tolerance:  ok Voiding:  ok Patient reports pain as mild.    Objective: Vital signs in last 24 hours: Temp:  [97.4 F (36.3 C)-98.9 F (37.2 C)] 98 F (36.7 C) (02/28 0444) Pulse Rate:  [45-81] 67 (02/28 0444) Resp:  [12-22] 20 (02/28 0444) BP: (94-151)/(59-108) 125/72 (02/28 0444) SpO2:  [94 %-100 %] 95 % (02/28 0444) Weight:  [78.3 kg (172 lb 9 oz)] 78.3 kg (172 lb 9 oz) (02/27 0814)  Labs: No results for input(s): HGB in the last 72 hours. No results for input(s): WBC, RBC, HCT, PLT in the last 72 hours. No results for input(s): NA, K, CL, CO2, BUN, CREATININE, GLUCOSE, CALCIUM in the last 72 hours. No results for input(s): LABPT, INR in the last 72 hours.  Physical Exam:  Neurologically intact ABD soft Neurovascular intact Sensation intact distally Intact pulses distally Dorsiflexion/Plantar flexion intact Incision: dressing C/D/I and no drainage No cellulitis present Compartment soft  Assessment/Plan:  1 Day Post-Op Procedure(s) (LRB): TOTAL HIP ARTHROPLASTY ANTERIOR APPROACH (Left) Advance diet Up with therapy D/C IV fluids Discharge home with home health today if cleared by PT. Follow up in office 2 weeks post op. Continue on ASA 325mg  BID x 4 weeks post op.   Wayne Santos, Ginger OrganNDREW PAUL 07/06/2016, 8:02 AM

## 2016-07-06 NOTE — Discharge Summary (Signed)
Patient ID: Wayne DressJerry L Waidelich MRN: [id number] DOB/AGE: 07/23/1950 66 y.o.  Admit date: 07/05/2016 Discharge date: 07/06/2016  Admission Diagnoses:  Principal Problem:   Primary localized osteoarthrosis of left hip Active Problems:   Primary osteoarthritis of left hip   Discharge Diagnoses:  Same  Past Medical History:  Diagnosis Date  . Anemia   . Asthma    "when I was younger"  . AVN (avascular necrosis of bone) (HCC)   . Benign paroxysmal positional vertigo 06/15/2015  . Encounter for long-term (current) use of medications 05/18/2016  . Glaucoma   . Headache   . HIV infection (HCC)   . Hyperlipidemia   . Hypertension   . Memory impairment 06/15/2015  . Osteoporosis   . Pneumonia   . Primary localized osteoarthrosis of left hip 07/05/2016  . Routine screening for STI (sexually transmitted infection) 05/18/2016    Surgeries: Procedure(s): TOTAL HIP ARTHROPLASTY ANTERIOR APPROACH on 07/05/2016   Consultants:   Discharged Condition: Improved  Hospital Course: Wayne Santos is an 66 y.o. male who was admitted 07/05/2016 for operative treatment ofPrimary localized osteoarthrosis of left hip. Patient has severe unremitting pain that affects sleep, daily activities, and work/hobbies. After pre-op clearance the patient was taken to the operating room on 07/05/2016 and underwent  Procedure(s): TOTAL HIP ARTHROPLASTY ANTERIOR APPROACH.    Patient was given perioperative antibiotics: Anti-infectives    Start     Dose/Rate Route Frequency Ordered Stop   07/05/16 1800  ceFAZolin (ANCEF) IVPB 2g/100 mL premix     2 g 200 mL/hr over 30 Minutes Intravenous Every 6 hours 07/05/16 1728 07/06/16 0038   07/05/16 1730  emtricitabine-tenofovir AF (DESCOVY) 200-25 MG per tablet 1 tablet     1 tablet Oral Daily 07/05/16 1728     07/05/16 1730  darunavir-cobicistat (PREZCOBIX) 800-150 MG per tablet 1 tablet     1 tablet Oral Daily 07/05/16 1728     07/05/16 0925  ceFAZolin (ANCEF) IVPB 2g/100 mL  premix     2 g 200 mL/hr over 30 Minutes Intravenous On call to O.R. 07/05/16 0925 07/05/16 1025   07/05/16 0827  ceFAZolin (ANCEF) 2-4 GM/100ML-% IVPB    Comments:  Tonna CornerEdwards, Jessica   : cabinet override      07/05/16 0827 07/05/16 1025       Patient was given sequential compression devices, early ambulation, and chemoprophylaxis to prevent DVT.  Patient benefited maximally from hospital stay and there were no complications.    Recent vital signs: Patient Vitals for the past 24 hrs:  BP Temp Temp src Pulse Resp SpO2 Height Weight  07/06/16 0444 125/72 98 F (36.7 C) Oral 67 20 95 % - -  07/06/16 0005 120/68 98 F (36.7 C) Oral 60 20 95 % - -  07/05/16 2151 127/60 98.9 F (37.2 C) Oral 72 20 95 % - -  07/05/16 1645 (!) 151/88 - - 81 20 95 % - -  07/05/16 1630 (!) 134/108 - - 64 16 95 % - -  07/05/16 1615 131/86 - - 66 13 95 % - -  07/05/16 1600 139/78 - - 64 14 97 % - -  07/05/16 1545 126/78 - - 70 15 100 % - -  07/05/16 1530 138/79 - - 72 (!) 22 94 % - -  07/05/16 1515 (!) 146/84 - - 70 (!) 22 99 % - -  07/05/16 1500 (!) 141/82 - - 67 16 100 % - -  07/05/16 1445 (!) 142/74 - - Marland Kitchen(!)  52 18 98 % - -  07/05/16 1430 (!) 127/92 - - 64 (!) 22 99 % - -  07/05/16 1415 130/75 - - (!) 57 14 97 % - -  07/05/16 1400 127/78 - - (!) 51 16 94 % - -  07/05/16 1345 138/71 - - (!) 47 12 97 % - -  07/05/16 1330 110/72 - - (!) 45 15 96 % - -  07/05/16 1315 104/60 - - (!) 54 13 96 % - -  07/05/16 1300 103/64 - - (!) 49 14 97 % - -  07/05/16 1245 94/66 - - (!) 55 15 98 % - -  07/05/16 1230 (!) 97/59 97.4 F (36.3 C) - (!) 57 13 97 % - -  07/05/16 0814 131/69 97.5 F (36.4 C) Oral 69 20 99 % 5\' 9"  (1.753 m) 78.3 kg (172 lb 9 oz)     Recent laboratory studies: No results for input(s): WBC, HGB, HCT, PLT, NA, K, CL, CO2, BUN, CREATININE, GLUCOSE, INR, CALCIUM in the last 72 hours.  Invalid input(s): PT, 2   Discharge Medications:   Allergies as of 07/06/2016      Reactions   Ace Inhibitors     COUGH      Medication List    STOP taking these medications   aspirin 81 MG tablet Replaced by:  aspirin 325 MG EC tablet     TAKE these medications   amLODipine 5 MG tablet Commonly known as:  NORVASC TAKE 1 TABLET BY MOUTH DAILY   aspirin 325 MG EC tablet Take 1 tablet (325 mg total) by mouth 2 (two) times daily after a meal. Replaces:  aspirin 81 MG tablet   bisacodyl 5 MG EC tablet Commonly known as:  DULCOLAX Take 1 tablet (5 mg total) by mouth daily as needed for moderate constipation.   calcium carbonate 1250 (500 Ca) MG tablet Commonly known as:  OS-CAL - dosed in mg of elemental calcium Take 1 tablet by mouth.   cetirizine 10 MG tablet Commonly known as:  ZYRTEC Take 1 tablet (10 mg total) by mouth at bedtime.   DESCOVY 200-25 MG tablet Generic drug:  emtricitabine-tenofovir AF TAKE 1 TABLET BY MOUTH ONCE DAILY   docusate sodium 100 MG capsule Commonly known as:  COLACE Take 1 capsule (100 mg total) by mouth 2 (two) times daily.   HYDROcodone-acetaminophen 5-325 MG tablet Commonly known as:  NORCO/VICODIN Take 1-2 tablets by mouth every 4 (four) hours as needed (breakthrough pain).   methocarbamol 500 MG tablet Commonly known as:  ROBAXIN Take 1 tablet (500 mg total) by mouth every 6 (six) hours as needed for muscle spasms.   mometasone 50 MCG/ACT nasal spray Commonly known as:  NASONEX Place 2 sprays into the nose daily.   pravastatin 40 MG tablet Commonly known as:  PRAVACHOL TAKE 1 TABLET BY MOUTH DAILY   PREZCOBIX 800-150 MG tablet Generic drug:  darunavir-cobicistat TAKE 1 TABLET BY MOUTH DAILY   Vitamin D (Ergocalciferol) 50000 units Caps capsule Commonly known as:  DRISDOL Take 1 capsule (50,000 Units total) by mouth every 7 (seven) days.            Durable Medical Equipment        Start     Ordered   07/05/16 1729  DME Walker rolling  Once    Question:  Patient needs a walker to treat with the following condition  Answer:   Primary osteoarthritis of left hip   07/05/16 1728  07/05/16 1729  DME 3 n 1  Once     07/05/16 1728   07/05/16 1729  DME Bedside commode  Once    Question:  Patient needs a bedside commode to treat with the following condition  Answer:  Primary osteoarthritis of left hip   07/05/16 1728      Diagnostic Studies: Dg Chest 2 View  Result Date: 06/27/2016 CLINICAL DATA:  Preoperative evaluation for left total hip replacement. Hypertension. EXAM: CHEST  2 VIEW COMPARISON:  Oct 06, 2013 FINDINGS: Lungs are clear. Heart size and pulmonary vascularity are normal. No adenopathy. No bone lesions. IMPRESSION: No edema or consolidation. Electronically Signed   By: Bretta Bang III M.D.   On: 06/27/2016 09:21   Dg C-arm 61-120 Min  Result Date: 07/05/2016 CLINICAL DATA:  Status post total hip replacement on the left EXAM: DG C-ARM 61-120 MIN; OPERATIVE LEFT HIP WITH PELVIS COMPARISON:  None. FLUOROSCOPY TIME:  0 minutes 29 seconds; 4 submitted images FINDINGS: Preliminary image shows advanced osteoporosis in the left femoral head with extensive left hip joint osteoarthritis. Frontal images show total hip replacement on the left with prosthetic components well-seated. No acute fracture or dislocation. There is also evidence of prior total hip replaced on the right with visualized portions of the prosthesis also well-seated. IMPRESSION: New left total hip replacement with prosthetic components well-seated. Prior total hip replacement on the right. No acute fracture or dislocation postoperative. Electronically Signed   By: Bretta Bang III M.D.   On: 07/05/2016 12:05   Dg Hip Operative Unilat W Or W/o Pelvis Left  Result Date: 07/05/2016 CLINICAL DATA:  Status post total hip replacement on the left EXAM: DG C-ARM 61-120 MIN; OPERATIVE LEFT HIP WITH PELVIS COMPARISON:  None. FLUOROSCOPY TIME:  0 minutes 29 seconds; 4 submitted images FINDINGS: Preliminary image shows advanced osteoporosis in the left  femoral head with extensive left hip joint osteoarthritis. Frontal images show total hip replacement on the left with prosthetic components well-seated. No acute fracture or dislocation. There is also evidence of prior total hip replaced on the right with visualized portions of the prosthesis also well-seated. IMPRESSION: New left total hip replacement with prosthetic components well-seated. Prior total hip replacement on the right. No acute fracture or dislocation postoperative. Electronically Signed   By: Bretta Bang III M.D.   On: 07/05/2016 12:05    Disposition:   Discharge Instructions    Call MD / Call 911    Complete by:  As directed    If you experience chest pain or shortness of breath, CALL 911 and be transported to the hospital emergency room.  If you develope a fever above 101 F, pus (white drainage) or increased drainage or redness at the wound, or calf pain, call your surgeon's office.   Constipation Prevention    Complete by:  As directed    Drink plenty of fluids.  Prune juice may be helpful.  You may use a stool softener, such as Colace (over the counter) 100 mg twice a day.  Use MiraLax (over the counter) for constipation as needed.   Diet - low sodium heart healthy    Complete by:  As directed    Discharge instructions    Complete by:  As directed    INSTRUCTIONS AFTER JOINT REPLACEMENT   Remove items at home which could result in a fall. This includes throw rugs or furniture in walking pathways ICE to the affected joint every three hours while awake for 30  minutes at a time, for at least the first 3-5 days, and then as needed for pain and swelling.  Continue to use ice for pain and swelling. You may notice swelling that will progress down to the foot and ankle.  This is normal after surgery.  Elevate your leg when you are not up walking on it.   Continue to use the breathing machine you got in the hospital (incentive spirometer) which will help keep your temperature  down.  It is common for your temperature to cycle up and down following surgery, especially at night when you are not up moving around and exerting yourself.  The breathing machine keeps your lungs expanded and your temperature down.   DIET:  As you were doing prior to hospitalization, we recommend a well-balanced diet.  DRESSING / WOUND CARE / SHOWERING  You may shower 3 days after surgery, but keep the wounds dry during showering.  You may use an occlusive plastic wrap (Press'n Seal for example), NO SOAKING/SUBMERGING IN THE BATHTUB.  If the bandage gets wet, change with a clean dry gauze.  If the incision gets wet, pat the wound dry with a clean towel.  ACTIVITY  Increase activity slowly as tolerated, but follow the weight bearing instructions below.   No driving for 6 weeks or until further direction given by your physician.  You cannot drive while taking narcotics.  No lifting or carrying greater than 10 lbs. until further directed by your surgeon. Avoid periods of inactivity such as sitting longer than an hour when not asleep. This helps prevent blood clots.  You may return to work once you are authorized by your doctor.     WEIGHT BEARING   Weight bearing as tolerated with assist device (walker, cane, etc) as directed, use it as long as suggested by your surgeon or therapist, typically at least 4-6 weeks.   EXERCISES  Results after joint replacement surgery are often greatly improved when you follow the exercise, range of motion and muscle strengthening exercises prescribed by your doctor. Safety measures are also important to protect the joint from further injury. Any time any of these exercises cause you to have increased pain or swelling, decrease what you are doing until you are comfortable again and then slowly increase them. If you have problems or questions, call your caregiver or physical therapist for advice.   Rehabilitation is important following a joint replacement.  After just a few days of immobilization, the muscles of the leg can become weakened and shrink (atrophy).  These exercises are designed to build up the tone and strength of the thigh and leg muscles and to improve motion. Often times heat used for twenty to thirty minutes before working out will loosen up your tissues and help with improving the range of motion but do not use heat for the first two weeks following surgery (sometimes heat can increase post-operative swelling).   These exercises can be done on a training (exercise) mat, on the floor, on a table or on a bed. Use whatever works the best and is most comfortable for you.    Use music or television while you are exercising so that the exercises are a pleasant break in your day. This will make your life better with the exercises acting as a break in your routine that you can look forward to.   Perform all exercises about fifteen times, three times per day or as directed.  You should exercise both the operative leg and  the other leg as well.   Exercises include:   Quad Sets - Tighten up the muscle on the front of the thigh (Quad) and hold for 5-10 seconds.   Straight Leg Raises - With your knee straight (if you were given a brace, keep it on), lift the leg to 60 degrees, hold for 3 seconds, and slowly lower the leg.  Perform this exercise against resistance later as your leg gets stronger.  Leg Slides: Lying on your back, slowly slide your foot toward your buttocks, bending your knee up off the floor (only go as far as is comfortable). Then slowly slide your foot back down until your leg is flat on the floor again.  Angel Wings: Lying on your back spread your legs to the side as far apart as you can without causing discomfort.  Hamstring Strength:  Lying on your back, push your heel against the floor with your leg straight by tightening up the muscles of your buttocks.  Repeat, but this time bend your knee to a comfortable angle, and push your heel  against the floor.  You may put a pillow under the heel to make it more comfortable if necessary.   A rehabilitation program following joint replacement surgery can speed recovery and prevent re-injury in the future due to weakened muscles. Contact your doctor or a physical therapist for more information on knee rehabilitation.    CONSTIPATION  Constipation is defined medically as fewer than three stools per week and severe constipation as less than one stool per week.  Even if you have a regular bowel pattern at home, your normal regimen is likely to be disrupted due to multiple reasons following surgery.  Combination of anesthesia, postoperative narcotics, change in appetite and fluid intake all can affect your bowels.   YOU MUST use at least one of the following options; they are listed in order of increasing strength to get the job done.  They are all available over the counter, and you may need to use some, POSSIBLY even all of these options:    Drink plenty of fluids (prune juice may be helpful) and high fiber foods Colace 100 mg by mouth twice a day  Senokot for constipation as directed and as needed Dulcolax (bisacodyl), take with full glass of water  Miralax (polyethylene glycol) once or twice a day as needed.  If you have tried all these things and are unable to have a bowel movement in the first 3-4 days after surgery call either your surgeon or your primary doctor.    If you experience loose stools or diarrhea, hold the medications until you stool forms back up.  If your symptoms do not get better within 1 week or if they get worse, check with your doctor.  If you experience "the worst abdominal pain ever" or develop nausea or vomiting, please contact the office immediately for further recommendations for treatment.   ITCHING:  If you experience itching with your medications, try taking only a single pain pill, or even half a pain pill at a time.  You can also use Benadryl over the  counter for itching or also to help with sleep.   TED HOSE STOCKINGS:  Use stockings on both legs until for at least 2 weeks or as directed by physician office. They may be removed at night for sleeping.  MEDICATIONS:  See your medication summary on the "After Visit Summary" that nursing will review with you.  You may have some home medications  which will be placed on hold until you complete the course of blood thinner medication.  It is important for you to complete the blood thinner medication as prescribed.  PRECAUTIONS:  If you experience chest pain or shortness of breath - call 911 immediately for transfer to the hospital emergency department.   If you develop a fever greater that 101 F, purulent drainage from wound, increased redness or drainage from wound, foul odor from the wound/dressing, or calf pain - CONTACT YOUR SURGEON.                                                   FOLLOW-UP APPOINTMENTS:  If you do not already have a post-op appointment, please call the office for an appointment to be seen by your surgeon.  Guidelines for how soon to be seen are listed in your "After Visit Summary", but are typically between 1-4 weeks after surgery.  OTHER INSTRUCTIONS:   Knee Replacement:  Do not place pillow under knee, focus on keeping the knee straight while resting. CPM instructions: 0-90 degrees, 2 hours in the morning, 2 hours in the afternoon, and 2 hours in the evening. Place foam block, curve side up under heel at all times except when in CPM or when walking.  DO NOT modify, tear, cut, or change the foam block in any way.  MAKE SURE YOU:  Understand these instructions.  Get help right away if you are not doing well or get worse.    Thank you for letting us be a part of your medical care team.  It is a privilege we respect greatly.  We hope these instructions will help you stay on track for a fast and full recovery!   Increase activity slowly as tolerated    Complete by:  As  directed       Follow-up Information    DALLDORF,PETER G, MD. Schedule an appointment as soon as possible for a visit in 2 weeks.   Specialty:  Orthopedic Surgery Contact information: [address] [place] [address] [phone]            Signed: Drema Halon 07/06/2016, 8:06 AM

## 2016-07-19 ENCOUNTER — Encounter: Payer: Self-pay | Admitting: Infectious Disease

## 2016-08-23 ENCOUNTER — Inpatient Hospital Stay (HOSPITAL_COMMUNITY)
Admission: EM | Admit: 2016-08-23 | Discharge: 2016-08-25 | DRG: 247 | Disposition: A | Discharge status: Home / Self Care | Payer: Medicare Other | Attending: Cardiovascular Disease | Admitting: Cardiovascular Disease

## 2016-08-23 ENCOUNTER — Emergency Department (HOSPITAL_COMMUNITY): Payer: Medicare Other

## 2016-08-23 ENCOUNTER — Encounter (HOSPITAL_COMMUNITY): Payer: Self-pay | Admitting: Emergency Medicine

## 2016-08-23 DIAGNOSIS — Z21 Asymptomatic human immunodeficiency virus [HIV] infection status: Secondary | ICD-10-CM | POA: Diagnosis present

## 2016-08-23 DIAGNOSIS — Z955 Presence of coronary angioplasty implant and graft: Secondary | ICD-10-CM

## 2016-08-23 DIAGNOSIS — I214 Non-ST elevation (NSTEMI) myocardial infarction: Secondary | ICD-10-CM | POA: Diagnosis present

## 2016-08-23 DIAGNOSIS — I2510 Atherosclerotic heart disease of native coronary artery without angina pectoris: Secondary | ICD-10-CM | POA: Diagnosis present

## 2016-08-23 DIAGNOSIS — Z79899 Other long term (current) drug therapy: Secondary | ICD-10-CM

## 2016-08-23 DIAGNOSIS — Z7982 Long term (current) use of aspirin: Secondary | ICD-10-CM

## 2016-08-23 DIAGNOSIS — I10 Essential (primary) hypertension: Secondary | ICD-10-CM | POA: Diagnosis present

## 2016-08-23 DIAGNOSIS — R0789 Other chest pain: Secondary | ICD-10-CM | POA: Diagnosis not present

## 2016-08-23 DIAGNOSIS — Z7951 Long term (current) use of inhaled steroids: Secondary | ICD-10-CM

## 2016-08-23 DIAGNOSIS — F17200 Nicotine dependence, unspecified, uncomplicated: Secondary | ICD-10-CM | POA: Diagnosis present

## 2016-08-23 DIAGNOSIS — F17210 Nicotine dependence, cigarettes, uncomplicated: Secondary | ICD-10-CM | POA: Diagnosis present

## 2016-08-23 DIAGNOSIS — Z96643 Presence of artificial hip joint, bilateral: Secondary | ICD-10-CM | POA: Diagnosis present

## 2016-08-23 DIAGNOSIS — E785 Hyperlipidemia, unspecified: Secondary | ICD-10-CM | POA: Diagnosis present

## 2016-08-23 HISTORY — DX: Other seasonal allergic rhinitis: J30.2

## 2016-08-23 HISTORY — DX: Atherosclerotic heart disease of native coronary artery without angina pectoris: I25.10

## 2016-08-23 HISTORY — DX: Gastro-esophageal reflux disease without esophagitis: K21.9

## 2016-08-23 HISTORY — DX: Unspecified asthma, uncomplicated: J45.909

## 2016-08-23 LAB — COMPREHENSIVE METABOLIC PANEL
ALT: 17 U/L (ref 17–63)
ANION GAP: 8 (ref 5–15)
AST: 21 U/L (ref 15–41)
Albumin: 3.7 g/dL (ref 3.5–5.0)
Alkaline Phosphatase: 106 U/L (ref 38–126)
BILIRUBIN TOTAL: 0.3 mg/dL (ref 0.3–1.2)
BUN: 8 mg/dL (ref 6–20)
CO2: 26 mmol/L (ref 22–32)
Calcium: 9.1 mg/dL (ref 8.9–10.3)
Chloride: 107 mmol/L (ref 101–111)
Creatinine, Ser: 0.99 mg/dL (ref 0.61–1.24)
GFR calc Af Amer: >60 mL/min (ref >60)
Glucose, Bld: 159 mg/dL — ABNORMAL HIGH (ref 65–99)
POTASSIUM: 3.1 mmol/L — AB (ref 3.5–5.1)
Sodium: 141 mmol/L (ref 135–145)
TOTAL PROTEIN: 6.7 g/dL (ref 6.5–8.1)

## 2016-08-23 LAB — CBC WITH DIFFERENTIAL/PLATELET
Basophils Absolute: 0.1 10*3/uL (ref 0.0–0.1)
Basophils Relative: 1 %
Eosinophils Absolute: 0.5 10*3/uL (ref 0.0–0.7)
Eosinophils Relative: 6 %
HEMATOCRIT: 37.3 % — AB (ref 39.0–52.0)
Hemoglobin: 12.6 g/dL — ABNORMAL LOW (ref 13.0–17.0)
LYMPHS PCT: 27 %
Lymphs Abs: 2.1 10*3/uL (ref 0.7–4.0)
MCH: 30.4 pg (ref 26.0–34.0)
MCHC: 33.8 g/dL (ref 30.0–36.0)
MCV: 89.9 fL (ref 78.0–100.0)
MONO ABS: 0.4 10*3/uL (ref 0.1–1.0)
MONOS PCT: 5 %
Neutro Abs: 4.9 10*3/uL (ref 1.7–7.7)
Neutrophils Relative %: 61 %
Platelets: 303 10*3/uL (ref 150–400)
RBC: 4.15 MIL/uL — ABNORMAL LOW (ref 4.22–5.81)
RDW: 12.9 % (ref 11.5–15.5)
WBC: 8 10*3/uL (ref 4.0–10.5)

## 2016-08-23 LAB — I-STAT TROPONIN, ED: TROPONIN I, POC: 0.06 ng/mL (ref 0.00–0.08)

## 2016-08-23 NOTE — ED Triage Notes (Signed)
Per ems chest pain on and off got worse at 6 pm rock sitting on chest, no radiation, SOB, h/s of HIV and HTN. 324 aspirin, decrease in pain from 9 to 3. 1 nitro, pain remained a 3.

## 2016-08-23 NOTE — ED Provider Notes (Signed)
[facility]-EMERGENCY DEPT Provider Note   CSN: [id number] Arrival date & time: 08/23/16  2113     History   Chief Complaint Chief Complaint  Patient presents with  . Chest Pain    HPI Wayne Santos is a 66 y.o. male history of HIV (nl CD4 1/18), hypertension, hyperlipidemia here presenting with chest pain. Patient has intermittent chest pain for the last 3 days or so. Patient states that there is no particular pattern and he was doing yard work today and did feel little bit worse chest pain was able to finish ordered work. Around 6 PM, he was At home and had sudden onset of feeling pressure in his chest. States that it was 9 out of 10 and he took 4 aspirin at home. He then called EMS and given 1 nitroglycerin and now is pain-free. He states that he had previous stress test many years ago that was unremarkable. Patient does not have a history of CAD or cardiac stents. Patient recently had a hip replacement and was briefly on xarelto but has been off of it. He denies leg swelling or shortness of breath.   The history is provided by the patient.    Past Medical History:  Diagnosis Date  . Anemia   . Asthma    "when I was younger"  . AVN (avascular necrosis of bone) (HCC)   . Benign paroxysmal positional vertigo 06/15/2015  . Encounter for long-term (current) use of medications 05/18/2016  . Glaucoma   . Headache   . HIV infection (HCC)   . Hyperlipidemia   . Hypertension   . Memory impairment 06/15/2015  . Osteoporosis   . Pneumonia   . Primary localized osteoarthrosis of left hip 07/05/2016  . Routine screening for STI (sexually transmitted infection) 05/18/2016    Patient Active Problem List   Diagnosis Date Noted  . Primary localized osteoarthrosis of left hip 07/05/2016  . Primary osteoarthritis of left hip 07/05/2016  . Encounter for long-term (current) use of medications 05/18/2016  . Routine screening for STI (sexually transmitted infection) 05/18/2016  . Benign paroxysmal  positional vertigo 06/15/2015  . Memory impairment 06/15/2015  . Benign essential HTN 06/02/2014  . Hyperglycemia 02/04/2013  . AVN (avascular necrosis of bone) (HCC)   . Glaucoma   . Hyperlipidemia 03/16/2011  . ONYCHOMYCOSIS, TOENAILS 03/24/2010  . SMOKER 03/24/2010  . 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 POSTOPERATIVE INFECTION 07/10/2008  . POSTNASAL DRIP SYNDROME 12/13/2007  . URINARY TRACT INFECTION SITE NOT SPECIFIED 04/04/2007  . BOILS, RECURRENT 11/14/2006  . HIV disease (HCC) 09/12/2006  . PANIC ATTACK 09/12/2006  . Essential hypertension 09/12/2006  . 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 HAND NERVE REPAIR Right   . JOINT REPLACEMENT    . TOTAL HIP ARTHROPLASTY Right   . TOTAL HIP ARTHROPLASTY Left 07/05/2016   Procedure: TOTAL HIP ARTHROPLASTY ANTERIOR APPROACH;  Surgeon: Marcene Corning, MD;  Location: [facility] OR;  Service: Orthopedics;  Laterality: Left;       Home Medications    Prior to Admission medications   Medication Sig Start Date End Date Taking? Authorizing Provider  amLODipine (NORVASC) 5 MG tablet TAKE 1 TABLET BY MOUTH DAILY 03/04/16   Randall Hiss, MD  aspirin EC 325 MG EC tablet Take 1 tablet (325 mg total) by mouth 2 (two) times daily after a meal. 07/06/16   Elodia Florence, PA-C  bisacodyl (DULCOLAX) 5 MG EC tablet Take  1 tablet (5 mg total) by mouth daily as needed for moderate constipation. 07/06/16   Elodia Florence, PA-C  calcium carbonate (OS-CAL - DOSED IN MG OF ELEMENTAL CALCIUM) 1250 (500 Ca) MG tablet Take 1 tablet by mouth.    Historical Provider, MD  cetirizine (ZYRTEC) 10 MG tablet Take 1 tablet (10 mg total) by mouth at bedtime. 11/02/15   Sherren Mocha, MD  DESCOVY 200-25 MG tablet TAKE 1 TABLET BY MOUTH ONCE DAILY 05/04/16   Randall Hiss, MD  docusate sodium (COLACE) 100 MG capsule Take 1 capsule (100 mg total) by mouth 2 (two) times daily. 07/06/16   Elodia Florence, PA-C    HYDROcodone-acetaminophen (NORCO/VICODIN) 5-325 MG tablet Take 1-2 tablets by mouth every 4 (four) hours as needed (breakthrough pain). 07/06/16   Elodia Florence, PA-C  methocarbamol (ROBAXIN) 500 MG tablet Take 1 tablet (500 mg total) by mouth every 6 (six) hours as needed for muscle spasms. 07/06/16   Elodia Florence, PA-C  mometasone (NASONEX) 50 MCG/ACT nasal spray Place 2 sprays into the nose daily. 11/02/15   Sherren Mocha, MD  pravastatin (PRAVACHOL) 40 MG tablet TAKE 1 TABLET BY MOUTH DAILY 05/31/16   Randall Hiss, MD  PREZCOBIX 800-150 MG tablet TAKE 1 TABLET BY MOUTH DAILY 05/04/16   Randall Hiss, MD  Vitamin D, Ergocalciferol, (DRISDOL) 50000 UNITS CAPS capsule Take 1 capsule (50,000 Units total) by mouth every 7 (seven) days. 04/08/13   Randall Hiss, MD    Family History No family history on file.  Social History Social History  Substance Use Topics  . Smoking status: Current Every Day Smoker    Packs/day: 0.50    Types: Cigarettes  . Smokeless tobacco: Former Neurosurgeon  . Alcohol use 0.0 oz/week     Comment: rare     Allergies   Ace inhibitors   Review of Systems Review of Systems  Cardiovascular: Positive for chest pain.  All other systems reviewed and are negative.    Physical Exam Updated Vital Signs BP 135/77   Pulse 77   Temp 98.4 F (36.9 C) (Oral)   Resp (!) 23   SpO2 98%   Physical Exam  Constitutional: He is oriented to person, place, and time. He appears well-developed and well-nourished.  HENT:  Head: Normocephalic.  Mouth/Throat: Oropharynx is clear and moist.  Eyes: EOM are normal. Pupils are equal, round, and reactive to light.  Neck: Normal range of motion. Neck supple.  Cardiovascular: Normal rate, regular rhythm and normal heart sounds.   Pulmonary/Chest: Effort normal and breath sounds normal. No respiratory distress. He has no wheezes.  No reproducible tenderness   Abdominal: Soft. Bowel sounds are normal. He exhibits no  distension. There is no tenderness.  Musculoskeletal: Normal range of motion.  No pedal edema, no calf tenderness   Neurological: He is alert and oriented to person, place, and time.  Skin: Skin is warm.  Psychiatric: He has a normal mood and affect.  Nursing note and vitals reviewed.    ED Treatments / Results  Labs (all labs ordered are listed, but only abnormal results are displayed) Labs Reviewed  CBC WITH DIFFERENTIAL/PLATELET - Abnormal; Notable for the following:       Result Value   RBC 4.15 (*)    Hemoglobin 12.6 (*)    HCT 37.3 (*)    All other components within normal limits  COMPREHENSIVE METABOLIC PANEL - Abnormal; Notable for the following:  Potassium 3.1 (*)    Glucose, Bld 159 (*)    All other components within normal limits  I-STAT TROPOININ, ED    EKG  EKG Interpretation  Date/Time:  Tuesday August 23 2016 21:29:48 EDT Ventricular Rate:  74 PR Interval:    QRS Duration: 91 QT Interval:  383 QTC Calculation: 425 R Axis:   60 Text Interpretation:  Sinus rhythm Nonspecific repol abnormality, lateral leads No significant change since last tracing Confirmed by YAO  MD, DAVID ([id number]) on 08/23/2016 9:48:25 PM       Radiology Dg Chest 2 View  Result Date: 08/23/2016 CLINICAL DATA:  Acute onset of central chest pain and shortness of breath. Initial encounter. EXAM: CHEST  2 VIEW COMPARISON:  Chest radiograph performed 06/27/2016 FINDINGS: The lungs are well-aerated and clear. There is no evidence of focal opacification, pleural effusion or pneumothorax. The heart is normal in size; the mediastinal contour is within normal limits. No acute osseous abnormalities are seen. IMPRESSION: No acute cardiopulmonary process seen. Electronically Signed   By: Roanna Raider M.D.   On: 08/23/2016 22:26    Procedures Procedures (including critical care time)  Medications Ordered in ED Medications - No data to display   Initial Impression / Assessment and Plan / ED  Course  I have reviewed the triage vital signs and the nursing notes.  Pertinent labs & imaging results that were available during my care of the patient were reviewed by me and considered in my medical decision making (see chart for details).     Wayne Santos is a 66 y.o. male here with chest pain. No hx of CAD. Had recent surgery but no signs of DVT/PE. His risks factors for CAD includes HTN, HL. Will get trop x 2, labs, CXR. Pain free currently.   11:50 PM CXR unremarkable. Istat trop 0.06. Pain free now. Delta trop at 1 am. If trop neg, anticipate dc home with stress test outpatient. Signed out to Dr. Shyrl Numbers in the ED.   Final Clinical Impressions(s) / ED Diagnoses   Final diagnoses:  Other chest pain    New Prescriptions New Prescriptions   No medications on file     Charlynne Pander, MD 08/23/16 2352

## 2016-08-23 NOTE — Discharge Instructions (Signed)
Take ASA 81 mg daily.   If you still have chest pain, call cardiology office to arrange for a stress test.   Return to ER if you have worse chest pain, shortness of breath, leg swelling.

## 2016-08-23 NOTE — ED Notes (Signed)
Patient transported to X-ray 

## 2016-08-23 NOTE — ED Provider Notes (Addendum)
  Physical Exam  BP 135/77   Pulse 77   Temp 98.4 F (36.9 C) (Oral)   Resp (!) 23   SpO2 98%   Physical Exam  ED Course  Procedures  MDM  Assuming care of patient from Yao   Patient in the ED for chest pain. Workup thus far shows neg EKG and trops.  Concerning findings are as following - recent hip surgery, but the pain is not pleuritic. Important pending results are repeat trop at 1 am.  According to Dr. Silverio Lay, plan is to discharge patient is trops x 2 are neg, and d/c with Cards f/u.   Patient had no complains, no concerns from the nursing side. Will continue to monitor.   Derwood Kaplan, MD 08/23/16 2354  2:18 AM Trops elevated at .20. Pt reports chest pain that felt like rock sitting on him, with associated diaphoresis. Hx of HTN, HL, smoking.  Currently chest pain free.  Cards consulted. Heparin started. Aspirin ordered.  CRITICAL CARE Performed by: Derwood Kaplan   Total critical care time: 38 minutes  Critical care time was exclusive of separately billable procedures and treating other patients.  Critical care was necessary to treat or prevent imminent or life-threatening deterioration.  Critical care was time spent personally by me on the following activities: development of treatment plan with patient and/or surrogate as well as nursing, discussions with consultants, evaluation of patient's response to treatment, examination of patient, obtaining history from patient or surrogate, ordering and performing treatments and interventions, ordering and review of laboratory studies, ordering and review of radiographic studies, pulse oximetry and re-evaluation of patient's condition.     Derwood Kaplan, MD 08/24/16 [phone]

## 2016-08-24 ENCOUNTER — Encounter (HOSPITAL_COMMUNITY)
Admission: EM | Disposition: A | Discharge status: Home / Self Care | Payer: Self-pay | Source: Home / Self Care | Attending: Cardiovascular Disease

## 2016-08-24 ENCOUNTER — Encounter (HOSPITAL_COMMUNITY): Payer: Self-pay | Admitting: Cardiology

## 2016-08-24 DIAGNOSIS — F17200 Nicotine dependence, unspecified, uncomplicated: Secondary | ICD-10-CM | POA: Diagnosis not present

## 2016-08-24 DIAGNOSIS — F17210 Nicotine dependence, cigarettes, uncomplicated: Secondary | ICD-10-CM | POA: Diagnosis present

## 2016-08-24 DIAGNOSIS — Z7951 Long term (current) use of inhaled steroids: Secondary | ICD-10-CM | POA: Diagnosis not present

## 2016-08-24 DIAGNOSIS — I214 Non-ST elevation (NSTEMI) myocardial infarction: Secondary | ICD-10-CM | POA: Diagnosis present

## 2016-08-24 DIAGNOSIS — E7800 Pure hypercholesterolemia, unspecified: Secondary | ICD-10-CM | POA: Diagnosis not present

## 2016-08-24 DIAGNOSIS — I2510 Atherosclerotic heart disease of native coronary artery without angina pectoris: Secondary | ICD-10-CM | POA: Diagnosis present

## 2016-08-24 DIAGNOSIS — E785 Hyperlipidemia, unspecified: Secondary | ICD-10-CM | POA: Diagnosis present

## 2016-08-24 DIAGNOSIS — R0789 Other chest pain: Secondary | ICD-10-CM | POA: Diagnosis present

## 2016-08-24 DIAGNOSIS — Z79899 Other long term (current) drug therapy: Secondary | ICD-10-CM | POA: Diagnosis not present

## 2016-08-24 DIAGNOSIS — I10 Essential (primary) hypertension: Secondary | ICD-10-CM | POA: Diagnosis present

## 2016-08-24 DIAGNOSIS — Z7982 Long term (current) use of aspirin: Secondary | ICD-10-CM | POA: Diagnosis not present

## 2016-08-24 DIAGNOSIS — Z21 Asymptomatic human immunodeficiency virus [HIV] infection status: Secondary | ICD-10-CM | POA: Diagnosis present

## 2016-08-24 DIAGNOSIS — Z96643 Presence of artificial hip joint, bilateral: Secondary | ICD-10-CM | POA: Diagnosis present

## 2016-08-24 HISTORY — PX: CORONARY ANGIOPLASTY WITH STENT PLACEMENT: SHX49

## 2016-08-24 HISTORY — PX: LEFT HEART CATH AND CORONARY ANGIOGRAPHY: CATH118249

## 2016-08-24 HISTORY — PX: CORONARY STENT INTERVENTION: CATH118234

## 2016-08-24 LAB — BASIC METABOLIC PANEL
ANION GAP: 6 (ref 5–15)
BUN: 8 mg/dL (ref 6–20)
CALCIUM: 9.1 mg/dL (ref 8.9–10.3)
CHLORIDE: 107 mmol/L (ref 101–111)
CO2: 28 mmol/L (ref 22–32)
Creatinine, Ser: 0.82 mg/dL (ref 0.61–1.24)
Glucose, Bld: 125 mg/dL — ABNORMAL HIGH (ref 65–99)
POTASSIUM: 2.9 mmol/L — AB (ref 3.5–5.1)
SODIUM: 141 mmol/L (ref 135–145)

## 2016-08-24 LAB — PROTIME-INR
INR: 1.01
Prothrombin Time: 13.3 seconds (ref 11.4–15.2)

## 2016-08-24 LAB — LIPID PANEL
Cholesterol: 134 mg/dL (ref 0–200)
HDL: 30 mg/dL — AB (ref >40)
LDL CALC: 79 mg/dL (ref 0–99)
Total CHOL/HDL Ratio: 4.5 RATIO
Triglycerides: 127 mg/dL (ref <150)
VLDL: 25 mg/dL (ref 0–40)

## 2016-08-24 LAB — POCT ACTIVATED CLOTTING TIME: Activated Clotting Time: 246 seconds

## 2016-08-24 LAB — CBC
HEMATOCRIT: 37.8 % — AB (ref 39.0–52.0)
HEMOGLOBIN: 12.6 g/dL — AB (ref 13.0–17.0)
MCH: 30.2 pg (ref 26.0–34.0)
MCHC: 33.3 g/dL (ref 30.0–36.0)
MCV: 90.6 fL (ref 78.0–100.0)
Platelets: 302 10*3/uL (ref 150–400)
RBC: 4.17 MIL/uL — AB (ref 4.22–5.81)
RDW: 13.3 % (ref 11.5–15.5)
WBC: 7.3 10*3/uL (ref 4.0–10.5)

## 2016-08-24 LAB — HEPARIN LEVEL (UNFRACTIONATED): Heparin Unfractionated: 0.32 IU/mL (ref 0.30–0.70)

## 2016-08-24 LAB — TROPONIN I
TROPONIN I: 0.27 ng/mL — AB (ref <0.03)
Troponin I: 0.23 ng/mL

## 2016-08-24 LAB — I-STAT TROPONIN, ED: Troponin i, poc: 0.2 ng/mL (ref 0.00–0.08)

## 2016-08-24 LAB — HEMOGLOBIN A1C
HEMOGLOBIN A1C: 6.1 % — AB (ref 4.8–5.6)
Mean Plasma Glucose: 128 mg/dL

## 2016-08-24 LAB — MRSA PCR SCREENING: MRSA BY PCR: NEGATIVE

## 2016-08-24 SURGERY — LEFT HEART CATH AND CORONARY ANGIOGRAPHY
Anesthesia: LOCAL

## 2016-08-24 MED ORDER — PRAVASTATIN SODIUM 40 MG PO TABS
40.0000 mg | ORAL_TABLET | Freq: Every day | ORAL | Status: DC
  Administered 2016-08-24 – 2016-08-25 (×2): 40 mg via ORAL
  Filled 2016-08-24 (×2): qty 1

## 2016-08-24 MED ORDER — SODIUM CHLORIDE 0.9 % WEIGHT BASED INFUSION: 1.0000 mL/kg/h | INTRAVENOUS | Status: AC

## 2016-08-24 MED ORDER — HEART ATTACK BOUNCING BOOK
Freq: Once | Status: AC
  Administered 2016-08-24: 21:00:00
  Filled 2016-08-24: qty 1

## 2016-08-24 MED ORDER — IOPAMIDOL (ISOVUE-370) INJECTION 76%
INTRAVENOUS | Status: AC
  Filled 2016-08-24: qty 50

## 2016-08-24 MED ORDER — SODIUM CHLORIDE 0.9% FLUSH
3.0000 mL | Freq: Two times a day (BID) | INTRAVENOUS | Status: DC
  Administered 2016-08-25: 09:00:00 3 mL via INTRAVENOUS

## 2016-08-24 MED ORDER — NITROGLYCERIN 1 MG/10 ML FOR IR/CATH LAB
INTRA_ARTERIAL | Status: DC | PRN
  Administered 2016-08-24: 200 ug via INTRACORONARY

## 2016-08-24 MED ORDER — HEPARIN BOLUS VIA INFUSION
4000.0000 [IU] | Freq: Once | INTRAVENOUS | Status: AC
  Administered 2016-08-24: 4000 [IU] via INTRAVENOUS
  Filled 2016-08-24: qty 4000

## 2016-08-24 MED ORDER — IOPAMIDOL (ISOVUE-370) INJECTION 76%
INTRAVENOUS | Status: AC
  Filled 2016-08-24: qty 100

## 2016-08-24 MED ORDER — HEPARIN SODIUM (PORCINE) 1000 UNIT/ML IJ SOLN
INTRAMUSCULAR | Status: DC | PRN
  Administered 2016-08-24 (×2): 4000 [IU] via INTRAVENOUS
  Administered 2016-08-24: 2000 [IU] via INTRAVENOUS

## 2016-08-24 MED ORDER — VERAPAMIL HCL 2.5 MG/ML IV SOLN
INTRAVENOUS | Status: AC
  Filled 2016-08-24: qty 2

## 2016-08-24 MED ORDER — EMTRICITABINE-TENOFOVIR AF 200-25 MG PO TABS
1.0000 | ORAL_TABLET | Freq: Every day | ORAL | Status: DC
  Administered 2016-08-24 – 2016-08-25 (×2): 1 via ORAL
  Filled 2016-08-24 (×2): qty 1

## 2016-08-24 MED ORDER — SODIUM CHLORIDE 0.9 % IV SOLN: 250.0000 mL | INTRAVENOUS | Status: DC | PRN

## 2016-08-24 MED ORDER — HEPARIN (PORCINE) IN NACL 100-0.45 UNIT/ML-% IJ SOLN
1000.0000 [IU]/h | INTRAMUSCULAR | Status: DC
  Administered 2016-08-24: 1000 [IU]/h via INTRAVENOUS
  Filled 2016-08-24: qty 250

## 2016-08-24 MED ORDER — SODIUM CHLORIDE 0.9% FLUSH: 3.0000 mL | INTRAVENOUS | Status: DC | PRN

## 2016-08-24 MED ORDER — ASPIRIN 81 MG PO CHEW
324.0000 mg | CHEWABLE_TABLET | Freq: Once | ORAL | Status: AC
  Administered 2016-08-24: 324 mg via ORAL
  Filled 2016-08-24: qty 4

## 2016-08-24 MED ORDER — MIDAZOLAM HCL 2 MG/2ML IJ SOLN
INTRAMUSCULAR | Status: DC | PRN
  Administered 2016-08-24 (×2): 1 mg via INTRAVENOUS

## 2016-08-24 MED ORDER — LIDOCAINE HCL (PF) 1 % IJ SOLN
INTRAMUSCULAR | Status: DC | PRN
  Administered 2016-08-24: 2 mL via SUBCUTANEOUS

## 2016-08-24 MED ORDER — IOPAMIDOL (ISOVUE-370) INJECTION 76%
INTRAVENOUS | Status: DC | PRN
  Administered 2016-08-24: 120 mL via INTRA_ARTERIAL

## 2016-08-24 MED ORDER — METOPROLOL TARTRATE 12.5 MG HALF TABLET
12.5000 mg | ORAL_TABLET | Freq: Two times a day (BID) | ORAL | Status: DC
  Administered 2016-08-24 – 2016-08-25 (×3): 12.5 mg via ORAL
  Filled 2016-08-24 (×3): qty 1

## 2016-08-24 MED ORDER — NITROGLYCERIN 0.4 MG SL SUBL: 0.4000 mg | SUBLINGUAL_TABLET | SUBLINGUAL | Status: DC | PRN

## 2016-08-24 MED ORDER — HEPARIN (PORCINE) IN NACL 2-0.9 UNIT/ML-% IJ SOLN
INTRAMUSCULAR | Status: DC | PRN
  Administered 2016-08-24: 1000 mL

## 2016-08-24 MED ORDER — LIDOCAINE HCL 1 % IJ SOLN
INTRAMUSCULAR | Status: AC
  Filled 2016-08-24: qty 20

## 2016-08-24 MED ORDER — SODIUM CHLORIDE 0.9 % WEIGHT BASED INFUSION
1.0000 mL/kg/h | INTRAVENOUS | Status: DC
  Administered 2016-08-24: 1 mL/kg/h via INTRAVENOUS

## 2016-08-24 MED ORDER — ANGIOPLASTY BOOK
Freq: Once | Status: AC
  Administered 2016-08-24: 21:00:00
  Filled 2016-08-24: qty 1

## 2016-08-24 MED ORDER — TICAGRELOR 90 MG PO TABS
ORAL_TABLET | ORAL | Status: DC | PRN
  Administered 2016-08-24: 180 mg via ORAL

## 2016-08-24 MED ORDER — ONDANSETRON HCL 4 MG/2ML IJ SOLN: 4.0000 mg | Freq: Four times a day (QID) | INTRAMUSCULAR | Status: DC | PRN

## 2016-08-24 MED ORDER — ACETAMINOPHEN 325 MG PO TABS: 650.0000 mg | ORAL_TABLET | ORAL | Status: DC | PRN

## 2016-08-24 MED ORDER — HEPARIN SODIUM (PORCINE) 1000 UNIT/ML IJ SOLN
INTRAMUSCULAR | Status: AC
  Filled 2016-08-24: qty 1

## 2016-08-24 MED ORDER — CLOPIDOGREL BISULFATE 75 MG PO TABS
600.0000 mg | ORAL_TABLET | Freq: Once | ORAL | Status: AC
  Administered 2016-08-24: 600 mg via ORAL
  Filled 2016-08-24: qty 8

## 2016-08-24 MED ORDER — POTASSIUM CHLORIDE CRYS ER 20 MEQ PO TBCR
40.0000 meq | EXTENDED_RELEASE_TABLET | Freq: Once | ORAL | Status: AC
  Administered 2016-08-24: 40 meq via ORAL
  Filled 2016-08-24: qty 2

## 2016-08-24 MED ORDER — CLOPIDOGREL BISULFATE 75 MG PO TABS
75.0000 mg | ORAL_TABLET | ORAL | Status: DC
  Administered 2016-08-25: 09:00:00 75 mg via ORAL
  Filled 2016-08-24: qty 1

## 2016-08-24 MED ORDER — ASPIRIN 81 MG PO CHEW
81.0000 mg | CHEWABLE_TABLET | ORAL | Status: AC
  Administered 2016-08-24: 81 mg via ORAL
  Filled 2016-08-24: qty 1

## 2016-08-24 MED ORDER — TICAGRELOR 90 MG PO TABS
ORAL_TABLET | ORAL | Status: AC
  Filled 2016-08-24: qty 2

## 2016-08-24 MED ORDER — MIDAZOLAM HCL 2 MG/2ML IJ SOLN
INTRAMUSCULAR | Status: AC
  Filled 2016-08-24: qty 2

## 2016-08-24 MED ORDER — NITROGLYCERIN 1 MG/10 ML FOR IR/CATH LAB
INTRA_ARTERIAL | Status: AC
  Filled 2016-08-24: qty 10

## 2016-08-24 MED ORDER — FENTANYL CITRATE (PF) 100 MCG/2ML IJ SOLN
INTRAMUSCULAR | Status: AC
  Filled 2016-08-24: qty 2

## 2016-08-24 MED ORDER — HEPARIN (PORCINE) IN NACL 2-0.9 UNIT/ML-% IJ SOLN
INTRAMUSCULAR | Status: AC
  Filled 2016-08-24: qty 500

## 2016-08-24 MED ORDER — SODIUM CHLORIDE 0.9 % WEIGHT BASED INFUSION
3.0000 mL/kg/h | INTRAVENOUS | Status: DC
  Administered 2016-08-24: 3 mL/kg/h via INTRAVENOUS

## 2016-08-24 MED ORDER — DARUNAVIR-COBICISTAT 800-150 MG PO TABS
1.0000 | ORAL_TABLET | Freq: Every day | ORAL | Status: DC
  Administered 2016-08-24 – 2016-08-25 (×2): 1 via ORAL
  Filled 2016-08-24 (×2): qty 1

## 2016-08-24 MED ORDER — VERAPAMIL HCL 2.5 MG/ML IV SOLN
INTRAVENOUS | Status: DC | PRN
  Administered 2016-08-24: 10 mL via INTRA_ARTERIAL

## 2016-08-24 MED ORDER — POTASSIUM CHLORIDE CRYS ER 20 MEQ PO TBCR: 40.0000 meq | EXTENDED_RELEASE_TABLET | Freq: Once | ORAL | Status: AC

## 2016-08-24 MED ORDER — FENTANYL CITRATE (PF) 100 MCG/2ML IJ SOLN
INTRAMUSCULAR | Status: DC | PRN
  Administered 2016-08-24 (×2): 50 ug via INTRAVENOUS

## 2016-08-24 MED ORDER — ASPIRIN EC 81 MG PO TBEC
81.0000 mg | DELAYED_RELEASE_TABLET | Freq: Every day | ORAL | Status: DC
  Administered 2016-08-25: 09:00:00 81 mg via ORAL
  Filled 2016-08-24: qty 1

## 2016-08-24 MED ORDER — SODIUM CHLORIDE 0.9% FLUSH
3.0000 mL | Freq: Two times a day (BID) | INTRAVENOUS | Status: DC
Start: 2016-08-24 — End: 2016-08-24

## 2016-08-24 MED ORDER — TICAGRELOR 90 MG PO TABS
90.0000 mg | ORAL_TABLET | Freq: Two times a day (BID) | ORAL | Status: DC
Start: 2016-08-24 — End: 2016-08-24

## 2016-08-24 SURGICAL SUPPLY — 17 items
BALLN EUPHORA RX 2.5X12 (BALLOONS) ×2
BALLN ~~LOC~~ EMERGE MR 2.75X20 (BALLOONS) ×2
BALLOON EUPHORA RX 2.5X12 (BALLOONS) IMPLANT
BALLOON ~~LOC~~ EMERGE MR 2.75X20 (BALLOONS) IMPLANT
CATH OPTITORQUE JACKY 4.0 5F (CATHETERS) ×1 IMPLANT
DEVICE RAD COMP TR BAND LRG (VASCULAR PRODUCTS) ×1 IMPLANT
GLIDESHEATH SLEND SS 6F .021 (SHEATH) ×1 IMPLANT
GUIDE CATH RUNWAY 6FR CLS3 (CATHETERS) ×1 IMPLANT
GUIDEWIRE INQWIRE 1.5J.035X260 (WIRE) IMPLANT
INQWIRE 1.5J .035X260CM (WIRE) ×2
KIT ENCORE 26 ADVANTAGE (KITS) ×1 IMPLANT
KIT HEART LEFT (KITS) ×2 IMPLANT
PACK CARDIAC CATHETERIZATION (CUSTOM PROCEDURE TRAY) ×2 IMPLANT
STENT RESOLUTE ONYX 2.5X30 (Permanent Stent) ×1 IMPLANT
TRANSDUCER W/STOPCOCK (MISCELLANEOUS) ×2 IMPLANT
TUBING CIL FLEX 10 FLL-RA (TUBING) ×2 IMPLANT
WIRE RUNTHROUGH .014X180CM (WIRE) ×1 IMPLANT

## 2016-08-24 NOTE — ED Provider Notes (Deleted)
[facility]-EMER[facility]Y DEPT Provider Note   CSN: [id number] Arrival date & time: 08/23/16  2113  By signing my name below, Sudie Grumbling and Octavia Heir, attest that this documentation has been prepared under the direction and in the presence of Derwood Kaplan, MD.  Electronically Signed: Octavia Heir, ED Scribe. 08/24/16. 2:02 AM.    History   Chief Complaint Chief Complaint  Patient presents with  . Chest Pain    HPI   HPI HPI Comments: Wayne Santos is a 66 y.o. male who presents with  PMHx of to the Emergency Department complaining of chest pain  Past Medical History:  Diagnosis Date  . Anemia   . Asthma    "when I was younger"  . AVN (avascular necrosis of bone) (HCC)   . Benign paroxysmal positional vertigo 06/15/2015  . Encounter for long-term (current) use of medications 05/18/2016  . Glaucoma   . Headache   . HIV infection (HCC)   . Hyperlipidemia   . Hypertension   . Memory impairment 06/15/2015  . Osteoporosis   . Pneumonia   . Primary localized osteoarthrosis of left hip 07/05/2016  . Routine screening for STI (sexually transmitted infection) 05/18/2016    Patient Active Problem List   Diagnosis Date Noted  . Primary localized osteoarthrosis of left hip 07/05/2016  . Primary osteoarthritis of left hip 07/05/2016  . Encounter for long-term (current) use of medications 05/18/2016  . Routine screening for STI (sexually transmitted infection) 05/18/2016  . Benign paroxysmal positional vertigo 06/15/2015  . Memory impairment 06/15/2015  . Benign essential HTN 06/02/2014  . Hyperglycemia 02/04/2013  . AVN (avascular necrosis of bone) (HCC)   . Glaucoma   . Hyperlipidemia 03/16/2011  . ONYCHOMYCOSIS, TOENAILS 03/24/2010  . SMOKER 03/24/2010  . 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 POSTOPERATIVE INFECTION 07/10/2008  . POSTNASAL DRIP SYNDROME 12/13/2007  . URINARY TRACT INFECTION SITE NOT SPECIFIED 04/04/2007  . BOILS, RECURRENT 11/14/2006  . HIV disease (HCC)  09/12/2006  . PANIC ATTACK 09/12/2006  . Essential hypertension 09/12/2006  . 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 HAND NERVE REPAIR Right   . JOINT REPLACEMENT    . TOTAL HIP ARTHROPLASTY Right   . TOTAL HIP ARTHROPLASTY Left 07/05/2016   Procedure: TOTAL HIP ARTHROPLASTY ANTERIOR APPROACH;  Surgeon: Marcene Corning, MD;  Location: [facility] OR;  Service: Orthopedics;  Laterality: Left;       Home Medications    Prior to Admission medications   Medication Sig Start Date End Date Taking? Authorizing Provider  amLODipine (NORVASC) 5 MG tablet TAKE 1 TABLET BY MOUTH DAILY 03/04/16   Randall Hiss, MD  aspirin EC 325 MG EC tablet Take 1 tablet (325 mg total) by mouth 2 (two) times daily after a meal. 07/06/16   Elodia Florence, PA-C  bisacodyl (DULCOLAX) 5 MG EC tablet Take 1 tablet (5 mg total) by mouth daily as needed for moderate constipation. 07/06/16   Elodia Florence, PA-C  calcium carbonate (OS-CAL - DOSED IN MG OF ELEMENTAL CALCIUM) 1250 (500 Ca) MG tablet Take 1 tablet by mouth.    Historical Provider, MD  cetirizine (ZYRTEC) 10 MG tablet Take 1 tablet (10 mg total) by mouth at bedtime. 11/02/15   Sherren Mocha, MD  DESCOVY 200-25 MG tablet TAKE 1 TABLET BY MOUTH ONCE DAILY 05/04/16   Randall Hiss, MD  docusate sodium (COLACE) 100 MG capsule Take 1 capsule (100 mg total)  by mouth 2 (two) times daily. 07/06/16   Elodia Florence, PA-C  HYDROcodone-acetaminophen (NORCO/VICODIN) 5-325 MG tablet Take 1-2 tablets by mouth every 4 (four) hours as needed (breakthrough pain). 07/06/16   Elodia Florence, PA-C  methocarbamol (ROBAXIN) 500 MG tablet Take 1 tablet (500 mg total) by mouth every 6 (six) hours as needed for muscle spasms. 07/06/16   Elodia Florence, PA-C  mometasone (NASONEX) 50 MCG/ACT nasal spray Place 2 sprays into the nose daily. 11/02/15   Sherren Mocha, MD  pravastatin (PRAVACHOL) 40 MG tablet TAKE 1 TABLET BY MOUTH DAILY 05/31/16    Randall Hiss, MD  PREZCOBIX 800-150 MG tablet TAKE 1 TABLET BY MOUTH DAILY 05/04/16   Randall Hiss, MD  Vitamin D, Ergocalciferol, (DRISDOL) 50000 UNITS CAPS capsule Take 1 capsule (50,000 Units total) by mouth every 7 (seven) days. 04/08/13   Randall Hiss, MD    Family History No family history on file.  Social History Social History  Substance Use Topics  . Smoking status: Current Every Day Smoker    Packs/day: 0.50    Types: Cigarettes  . Smokeless tobacco: Former Neurosurgeon  . Alcohol use 0.0 oz/week     Comment: rare     Allergies   Ace inhibitors   Review of Systems Review of Systems   Physical Exam Updated Vital Signs BP 129/77   Pulse 73   Temp 98.4 F (36.9 C) (Oral)   Resp 18   SpO2 95%   Physical Exam   ED Treatments / Results  Labs (all labs ordered are listed, but only abnormal results are displayed) Labs Reviewed  CBC WITH DIFFERENTIAL/PLATELET - Abnormal; Notable for the following:       Result Value   RBC 4.15 (*)    Hemoglobin 12.6 (*)    HCT 37.3 (*)    All other components within normal limits  COMPREHENSIVE METABOLIC PANEL - Abnormal; Notable for the following:    Potassium 3.1 (*)    Glucose, Bld 159 (*)    All other components within normal limits  I-STAT TROPOININ, ED - Abnormal; Notable for the following:    Troponin i, poc 0.20 (*)    All other components within normal limits  TROPONIN I  I-STAT TROPOININ, ED    EKG  EKG Interpretation  Date/Time:  Tuesday August 23 2016 21:29:48 EDT Ventricular Rate:  74 PR Interval:    QRS Duration: 91 QT Interval:  383 QTC Calculation: 425 R Axis:   60 Text Interpretation:  Sinus rhythm Nonspecific repol abnormality, lateral leads No significant change since last tracing Confirmed by YAO  MD, DAVID ([id number]) on 08/23/2016 9:48:25 PM       Radiology Dg Chest 2 View  Result Date: 08/23/2016 CLINICAL DATA:  Acute onset of central chest pain and shortness of breath.  Initial encounter. EXAM: CHEST  2 VIEW COMPARISON:  Chest radiograph performed 06/27/2016 FINDINGS: The lungs are well-aerated and clear. There is no evidence of focal opacification, pleural effusion or pneumothorax. The heart is normal in size; the mediastinal contour is within normal limits. No acute osseous abnormalities are seen. IMPRESSION: No acute cardiopulmonary process seen. Electronically Signed   By: Roanna Raider M.D.   On: 08/23/2016 22:26    Procedures Procedures (including critical care time)  Medications Ordered in ED Medications  aspirin chewable tablet 324 mg (not administered)     Initial Impression / Assessment and Plan / ED Course  I have reviewed the triage  vital signs and the nursing notes.  Pertinent labs & imaging results that were available during my care of the patient were reviewed by me and considered in my medical decision making (see chart for details).       Final Clinical Impressions(s) / ED Diagnoses   Final diagnoses:  Other chest pain    New Prescriptions New Prescriptions   No medications on file     Derwood Kaplan, MD 08/25/16 1438

## 2016-08-24 NOTE — Progress Notes (Signed)
TR BAND REMOVAL  LOCATION:    right radial  DEFLATED PER PROTOCOL:    Yes.    TIME BAND OFF / DRESSING APPLIED:    1930   SITE UPON ARRIVAL:    Level 0  SITE AFTER BAND REMOVAL:    Level 0  CIRCULATION SENSATION AND MOVEMENT:    Within Normal Limits   Yes.    COMMENTS:   Tolerated procedure well 

## 2016-08-24 NOTE — H&P (Signed)
CARDIOLOGY HISTORY & PHYSICAL  Primary Physician:Dr. Daiva Eves Primary Cardiologist: none Reason for Admission: NSTEMI   HPI: [name] is a 66 yo man with PMH HIV, well controlled on antivirals, hypertension, and recent total hip arthroplasty who was brought to the ER by EMS after sudden onset chest pain 4/17. Patient reports that he has had very mild twinges of chest pain for several days, but today he had 9/10 crushing substernal chest pain. He had been working in the garden all day, but the pain came on after. He received NG by EMS, and he reports he has been chest pain free since that time. He denies any prior history of CAD. He does smoke 1/2 ppd. He has lost 20 lbs and has been working on improving his diet and exercise. He does not have a family history of heart disease.   He had been doing well post total hip arthroplasty. He was taking full dose aspirin for four weeks post-operatively for DVT prophylaxis.   Review of Systems:     Cardiac Review of Systems: {Y] = yes  = no  Chest Pain [   Y ]  Resting SOB [   ] Exertional SOB  [  ]  Orthopnea [  ]   Pedal Edema [   ]    Palpitations [  ] Syncope  [  ]   Presyncope [   ]  General Review of Systems: [Y] = yes [  ]=no Constitional: recent weight change [  ]; anorexia [  ]; fatigue [  ]; nausea [  ]; night sweats [  ]; fever [  ]; or chills [  ];       Eyes : blurred vision [  ]; diplopia [   ]; vision changes [  ];  Amaurosis fugax[  ]; Resp: cough [  ];  wheezing[  ];  hemoptysis[  ];  PND [  ];  GI:  gallstones[  ], vomiting[  ];  dysphagia[  ]; melena[  ];  hematochezia [  ]; heartburn[  ];   GU: kidney stones [  ]; hematuria[  ];   dysuria [  ];  nocturia[  ]; incontinence [  ];             Skin: rash, swelling[  ];, hair loss[  ];  peripheral edema[  ];  or itching[  ]; Musculosketetal: myalgias[  ];  joint swelling[  ];  joint erythema[  ];  joint pain[  ];  back pain[  ];  Heme/Lymph: bruising[  ];  bleeding[  ];   anemia[  ];  Neuro: TIA[  ];  headaches[  ];  stroke[  ];  vertigo[  ];  seizures[  ];   paresthesias[  ];  difficulty walking[  ];  Psych:depression[  ]; anxiety[  ];  Endocrine: diabetes[  ];  thyroid dysfunction[  ];  Other:  Past Medical History:  Diagnosis Date  . Anemia   . Asthma    "when I was younger"  . AVN (avascular necrosis of bone) (HCC)   . Benign paroxysmal positional vertigo 06/15/2015  . Encounter for long-term (current) use of medications 05/18/2016  . Glaucoma   . Headache   . HIV infection (HCC)   . Hyperlipidemia   . Hypertension   . Memory impairment 06/15/2015  . NSTEMI (non-ST elevated myocardial infarction) (HCC) 08/24/2016  . Osteoporosis   . Pneumonia   . Primary localized osteoarthrosis of left  hip 07/05/2016  . Routine screening for STI (sexually transmitted infection) 05/18/2016   Home Medications            Prior to Admission medications   Medication Sig Start Date End Date Taking? Authorizing Provider  amLODipine (NORVASC) 5 MG tablet TAKE 1 TABLET BY MOUTH DAILY 03/04/16   Randall Hiss, MD  aspirin EC 325 MG EC tablet Take 1 tablet (325 mg total) by mouth 2 (two) times daily after a meal. 07/06/16   Elodia Florence, PA-C  bisacodyl (DULCOLAX) 5 MG EC tablet Take 1 tablet (5 mg total) by mouth daily as needed for moderate constipation. 07/06/16   Elodia Florence, PA-C  calcium carbonate (OS-CAL - DOSED IN MG OF ELEMENTAL CALCIUM) 1250 (500 Ca) MG tablet Take 1 tablet by mouth.    Historical Provider, MD  cetirizine (ZYRTEC) 10 MG tablet Take 1 tablet (10 mg total) by mouth at bedtime. 11/02/15   Sherren Mocha, MD  DESCOVY 200-25 MG tablet TAKE 1 TABLET BY MOUTH ONCE DAILY 05/04/16   Randall Hiss, MD  docusate sodium (COLACE) 100 MG capsule Take 1 capsule (100 mg total) by mouth 2 (two) times daily. 07/06/16   Elodia Florence, PA-C  HYDROcodone-acetaminophen (NORCO/VICODIN) 5-325 MG tablet Take 1-2 tablets by mouth every 4 (four) hours as  needed (breakthrough pain). 07/06/16   Elodia Florence, PA-C  methocarbamol (ROBAXIN) 500 MG tablet Take 1 tablet (500 mg total) by mouth every 6 (six) hours as needed for muscle spasms. 07/06/16   Elodia Florence, PA-C  mometasone (NASONEX) 50 MCG/ACT nasal spray Place 2 sprays into the nose daily. 11/02/15   Sherren Mocha, MD  pravastatin (PRAVACHOL) 40 MG tablet TAKE 1 TABLET BY MOUTH DAILY 05/31/16   Randall Hiss, MD  PREZCOBIX 800-150 MG tablet TAKE 1 TABLET BY MOUTH DAILY 05/04/16   Randall Hiss, MD  Vitamin D, Ergocalciferol, (DRISDOL) 50000 UNITS CAPS capsule Take 1 capsule (50,000 Units total) by mouth every 7 (seven) days. 04/08/13   Randall Hiss, MD     Infusions: . heparin 1,000 Units/hr (08/24/16 0242)    Allergies  Allergen Reactions  . Ace Inhibitors     COUGH    Social History   Social History  . Marital status: Single    Spouse name: N/A  . Number of children: N/A  . Years of education: N/A   Occupational History  . Not on file.   Social History Main Topics  . Smoking status: Current Every Day Smoker    Packs/day: 0.50    Types: Cigarettes  . Smokeless tobacco: Former Neurosurgeon  . Alcohol use 0.0 oz/week     Comment: rare  . Drug use: No  . Sexual activity: Yes     Comment: declined condoms   Other Topics Concern  . Not on file   Social History Narrative  . No narrative on file    No family history on file. Denies family history of cardiovascular disease  PHYSICAL EXAM: Vitals:   08/24/16 0300 08/24/16 0315  BP: 137/70 (!) 147/78  Pulse: 61 64  Resp: 15 18  Temp:      No intake or output data in the 24 hours ending 08/24/16 0356  General:  Well appearing. No respiratory difficulty HEENT: normal Neck: supple. no JVD. Carotids 2+ bilat; no bruits. No lymphadenopathy or thryomegaly appreciated. Cor: PMI nondisplaced. Regular rate & rhythm. No rubs, gallops or murmurs. Lungs: clear Abdomen: soft,  nontender, nondistended. No  hepatosplenomegaly. No bruits or masses. Good bowel sounds. Extremities: no cyanosis, clubbing, rash, edema Neuro: alert & oriented x 3, cranial nerves grossly intact. moves all 4 extremities w/o difficulty. Affect pleasant.  ECG: sinus rhythm, not significantly changed from prior  Results for orders placed or performed during the hospital encounter of 08/23/16 (from the past 24 hour(s))  CBC with Differential/Platelet     Status: Abnormal   Collection Time: 08/23/16  9:40 PM  Result Value Ref Range   WBC 8.0 4.0 - 10.5 K/uL   RBC 4.15 (L) 4.22 - 5.81 MIL/uL   Hemoglobin 12.6 (L) 13.0 - 17.0 g/dL   HCT 40.9 (L) 81.1 - 91.4 %   MCV 89.9 78.0 - 100.0 fL   MCH 30.4 26.0 - 34.0 pg   MCHC 33.8 30.0 - 36.0 g/dL   RDW 78.2 95.6 - 21.3 %   Platelets 303 150 - 400 K/uL   Neutrophils Relative % 61 %   Neutro Abs 4.9 1.7 - 7.7 K/uL   Lymphocytes Relative 27 %   Lymphs Abs 2.1 0.7 - 4.0 K/uL   Monocytes Relative 5 %   Monocytes Absolute 0.4 0.1 - 1.0 K/uL   Eosinophils Relative 6 %   Eosinophils Absolute 0.5 0.0 - 0.7 K/uL   Basophils Relative 1 %   Basophils Absolute 0.1 0.0 - 0.1 K/uL  Comprehensive metabolic panel     Status: Abnormal   Collection Time: 08/23/16  9:40 PM  Result Value Ref Range   Sodium 141 135 - 145 mmol/L   Potassium 3.1 (L) 3.5 - 5.1 mmol/L   Chloride 107 101 - 111 mmol/L   CO2 26 22 - 32 mmol/L   Glucose, Bld 159 (H) 65 - 99 mg/dL   BUN 8 6 - 20 mg/dL   Creatinine, Ser 0.86 0.61 - 1.24 mg/dL   Calcium 9.1 8.9 - 57.8 mg/dL   Total Protein 6.7 6.5 - 8.1 g/dL   Albumin 3.7 3.5 - 5.0 g/dL   AST 21 15 - 41 U/L   ALT 17 17 - 63 U/L   Alkaline Phosphatase 106 38 - 126 U/L   Total Bilirubin 0.3 0.3 - 1.2 mg/dL   GFR calc non Af Amer >60 >60 mL/min   GFR calc Af Amer >60 >60 mL/min   Anion gap 8 5 - 15  I-stat troponin, ED     Status: None   Collection Time: 08/23/16  9:51 PM  Result Value Ref Range   Troponin i, poc 0.06 0.00 - 0.08 ng/mL   Comment 3            I-stat troponin, ED     Status: Abnormal   Collection Time: 08/24/16  1:26 AM  Result Value Ref Range   Troponin i, poc 0.20 (HH) 0.00 - 0.08 ng/mL   Comment NOTIFIED PHYSICIAN    Comment 3          Troponin I     Status: Abnormal   Collection Time: 08/24/16  2:14 AM  Result Value Ref Range   Troponin I 0.23 (HH) <0.03 ng/mL   Dg Chest 2 View  Result Date: 08/23/2016 CLINICAL DATA:  Acute onset of central chest pain and shortness of breath. Initial encounter. EXAM: CHEST  2 VIEW COMPARISON:  Chest radiograph performed 06/27/2016 FINDINGS: The lungs are well-aerated and clear. There is no evidence of focal opacification, pleural effusion or pneumothorax. The heart is normal in size; the mediastinal contour is within  normal limits. No acute osseous abnormalities are seen. IMPRESSION: No acute cardiopulmonary process seen. Electronically Signed   By: Roanna Raider M.D.   On: 08/23/2016 22:26    ASSESSMENT AND PLAN: 1. NSTEMI: patient with chest pain, risk factors of hypertension, dyslipidemia (last lipid panel Tchol 167, TG 112, HDL 32, LDL 113 in 2016). Current every day smoker. Now chest pain free, but troponins are uptrending. -heparin drip per pharmacy -received 324 mg ASA, continue 81 mg ASA daily -hold home amlodipine, started metoprolol tartrate 12.5 mg BID. -NPO, consider cath in AM. Hold on P2Y12 until cath -continue pravastatin 40 mg for now. Both rosuvastatin and atorvastatin have interactions with antiretrovirals that require close monitoring. Would benefit from high intensity statin if he can tolerate with his antivirals. Recheck lipid panel -echo -cardiac rehab post discharge -smoking cessation counseling -diet and exercise counseling -trend troponin -check A1c, was borderline in 2015  Also, patient expressed financial concerns, recommend discussion with case manager prior to discharge.

## 2016-08-24 NOTE — Progress Notes (Signed)
Progress Note  Patient Name: Wayne Santos Date of Encounter: 08/24/2016  Primary Cardiologist: New  Subjective   No further chest pain.   Inpatient Medications    Scheduled Meds: . [START ON 08/25/2016] aspirin EC  81 mg Oral Daily  . darunavir-cobicistat  1 tablet Oral Daily  . emtricitabine-tenofovir AF  1 tablet Oral Daily  . metoprolol tartrate  12.5 mg Oral BID  . pravastatin  40 mg Oral Daily   Continuous Infusions: . heparin 1,000 Units/hr (08/24/16 0600)   PRN Meds: acetaminophen, nitroGLYCERIN, ondansetron (ZOFRAN) IV   Vital Signs    Vitals:   08/24/16 0515 08/24/16 0600 08/24/16 0620 08/24/16 0700  BP: 105/69  131/68 131/74  Pulse: 74     Resp: 15  16 (!) 21  Temp: 98.4 F (36.9 C)     TempSrc: Oral     SpO2: 93%  96% 95%  Weight:  171 lb 8.3 oz (77.8 kg)    Height:   (1.753 m)      Intake/Output Summary (Last 24 hours) at 08/24/16 0737 Last data filed at 08/24/16 0600  Gross per 24 hour  Intake               33 ml  Output                0 ml  Net               33 ml   Filed Weights   08/24/16 0600  Weight: 171 lb 8.3 oz (77.8 kg)    Telemetry    SR - Personally Reviewed  ECG    SR with nonspecific ST changes in lateral leads - Personally Reviewed  Physical Exam   General: Well developed, well nourished, AA male appearing in no acute distress. Head: Normocephalic, atraumatic.  Neck: Supple without bruits, JVD. Lungs:  Resp regular and unlabored, CTA. Heart: RRR, S1, S2, no S3, S4, or murmur; no rub. Abdomen: Soft, non-tender, non-distended with normoactive bowel sounds. No hepatomegaly. No rebound/guarding. No obvious abdominal masses. Extremities: No clubbing, cyanosis, edema. Distal pedal pulses are 2+ bilaterally. Neuro: Alert and oriented X 3. Moves all extremities spontaneously. Psych: Normal affect.  Labs    Chemistry Recent Labs Lab 08/23/16 2140  NA 141  K 3.1*  CL 107  CO2 26  GLUCOSE 159*  BUN 8    CREATININE 0.99  CALCIUM 9.1  PROT 6.7  ALBUMIN 3.7  AST 21  ALT 17  ALKPHOS 106  BILITOT 0.3  GFRNONAA >60  GFRAA >60  ANIONGAP 8     Hematology Recent Labs Lab 08/23/16 2140 08/24/16 0648  WBC 8.0 7.3  RBC 4.15* 4.17*  HGB 12.6* 12.6*  HCT 37.3* 37.8*  MCV 89.9 90.6  MCH 30.4 30.2  MCHC 33.8 33.3  RDW 12.9 13.3  PLT 303 302    Cardiac Enzymes Recent Labs Lab 08/24/16 0214  TROPONINI 0.23*    Recent Labs Lab 08/23/16 2151 08/24/16 0126  TROPIPOC 0.06 0.20*     BNPNo results for input(s): BNP, PROBNP in the last 168 hours.   DDimer No results for input(s): DDIMER in the last 168 hours.    Radiology    Dg Chest 2 View  Result Date: 08/23/2016 CLINICAL DATA:  Acute onset of central chest pain and shortness of breath. Initial encounter. EXAM: CHEST  2 VIEW COMPARISON:  Chest radiograph performed 06/27/2016 FINDINGS: The lungs are well-aerated and clear. There is no evidence of  focal opacification, pleural effusion or pneumothorax. The heart is normal in size; the mediastinal contour is within normal limits. No acute osseous abnormalities are seen. IMPRESSION: No acute cardiopulmonary process seen. Electronically Signed   By: Roanna Raider M.D.   On: 08/23/2016 22:26    Cardiac Studies   None  Patient Profile     66 y.o. male with PMH of HIV, HTN, HL, tobacco use and hip arthroplasty who presented with exertional chest pain.   Assessment & Plan    1. NSTEMI: Currently without chest pain. Reports being on antihypertensives, statin over the past 10 years. Normally followed by his primary care provider. Long hx of smoking, since he was 66 yo. Trop trend 0.06>>0.2>>0.23.  -- currently on IV heparin -- amlodipine held on admission, with metoprolol started -- remains on pravastatin for now, may need to change based on cath results -- plan for cardiac catheterization today. The patient understands that risks included but are not limited to stroke (1 in  1000), death (1 in 1000), kidney failure [usually temporary] (1 in 500), bleeding (1 in 200), allergic reaction [possibly serious] (1 in 200). Patient is a little apprehensive about cath, wil have MD follow up.   2. HTN: plan as above  3. HL: Continue statin  4. Smoking cessation: cessation encouraged  Signed, Laverda Page, NP  08/24/2016, 7:37 AM

## 2016-08-24 NOTE — Progress Notes (Signed)
ANTICOAGULATION CONSULT NOTE - Follow Up Consult  Pharmacy Consult for Heparin Indication: chest pain/ACS  Allergies  Allergen Reactions  . Ace Inhibitors     COUGH    Patient Measurements: Height:  (175.3 cm) Weight: 171 lb 8.3 oz (77.8 kg) IBW/kg (Calculated) : 70.7  Vital Signs: Temp: 98.4 F (36.9 C) (04/18 0700) Temp Source: Oral (04/18 0700) BP: 119/69 (04/18 0900) Pulse Rate: 74 (04/18 0515)  Labs:  Recent Labs  08/23/16 2140 08/24/16 0214 08/24/16 0648 08/24/16 0744  HGB 12.6*  --  12.6*  --   HCT 37.3*  --  37.8*  --   PLT 303  --  302  --   LABPROT  --   --  13.3  --   INR  --   --  1.01  --   HEPARINUNFRC  --   --   --  0.32  CREATININE 0.99  --  0.82  --   TROPONINI  --  0.23* 0.27*  --     Estimated Creatinine Clearance: 89.8 mL/min (by C-G formula based on SCr of 0.82 mg/dL).   Medications:  Heparin @ 1000 units/hr  Assessment: 66yom continues on heparin for NSTEMI with plan for cath today. Initial heparin level is therapeutic at 0.32. CBC stable. No bleeding.  Goal of Therapy:  Heparin level 0.3-0.7 units/ml Monitor platelets by anticoagulation protocol: Yes   Plan:  1) Continue heparin at 1000 units/hr 2) Follow up after cath  Fredrik Rigger 08/24/2016,11:56 AM

## 2016-08-24 NOTE — Progress Notes (Signed)
ANTICOAGULATION CONSULT NOTE - Initial Consult  Pharmacy Consult for Heparin Indication: chest pain/ACS  Allergies  Allergen Reactions  . Ace Inhibitors     COUGH    Patient Measurements:   Heparin Dosing Weight: 78 kg  Vital Signs: Temp: 98.4 F (36.9 C) (04/17 2131) Temp Source: Oral (04/17 2131) BP: 129/77 (04/18 0130) Pulse Rate: 73 (04/18 0130)  Labs:  Recent Labs  08/23/16 2140  HGB 12.6*  HCT 37.3*  PLT 303  CREATININE 0.99    CrCl cannot be calculated (Unknown ideal weight.).   Medical History: Past Medical History:  Diagnosis Date  . Anemia   . Asthma    "when I was younger"  . AVN (avascular necrosis of bone) (HCC)   . Benign paroxysmal positional vertigo 06/15/2015  . Encounter for long-term (current) use of medications 05/18/2016  . Glaucoma   . Headache   . HIV infection (HCC)   . Hyperlipidemia   . Hypertension   . Memory impairment 06/15/2015  . Osteoporosis   . Pneumonia   . Primary localized osteoarthrosis of left hip 07/05/2016  . Routine screening for STI (sexually transmitted infection) 05/18/2016    Medications:  Awaiting electronic med rec  Assessment: 66 y.o. M presents with CP. To begin heparin for r/o ACS. No AC PTA. CBC ok on admission.  Goal of Therapy:  Heparin level 0.3-0.7 units/ml Monitor platelets by anticoagulation protocol: Yes   Plan:  Heparin IV bolus 4000 units Heparin gtt at 1000 units/hr Will f/u heparin level in 6 hours Daily heparin level and CBC  Christoper Fabian, PharmD, BCPS Clinical pharmacist, pager [phone] 08/24/2016,2:04 AM

## 2016-08-24 NOTE — ED Notes (Signed)
Pt NPO for procedure in AM

## 2016-08-24 NOTE — Interval H&P Note (Signed)
Cath Lab Visit (complete for each Cath Lab visit)  Clinical Evaluation Leading to the Procedure:   ACS: Yes.    Non-ACS:  n/a     History and Physical Interval Note:  08/24/2016 12:37 PM  Wayne Santos  has presented today for surgery, with the diagnosis of cp  The various methods of treatment have been discussed with the patient and family. After consideration of risks, benefits and other options for treatment, the patient has consented to  Procedure(s): Left Heart Cath and Coronary Angiography (N/A) as a surgical intervention .  The patient's history has been reviewed, patient examined, no change in status, stable for surgery.  I have reviewed the patient's chart and labs.  Questions were answered to the patient's satisfaction.     Lorine Bears

## 2016-08-25 ENCOUNTER — Encounter (HOSPITAL_COMMUNITY): Payer: Self-pay | Admitting: Cardiology

## 2016-08-25 DIAGNOSIS — F17200 Nicotine dependence, unspecified, uncomplicated: Secondary | ICD-10-CM

## 2016-08-25 DIAGNOSIS — E7800 Pure hypercholesterolemia, unspecified: Secondary | ICD-10-CM

## 2016-08-25 DIAGNOSIS — I10 Essential (primary) hypertension: Secondary | ICD-10-CM

## 2016-08-25 LAB — BASIC METABOLIC PANEL
Anion gap: 6 (ref 5–15)
BUN: 10 mg/dL (ref 6–20)
CHLORIDE: 108 mmol/L (ref 101–111)
CO2: 25 mmol/L (ref 22–32)
CREATININE: 0.88 mg/dL (ref 0.61–1.24)
Calcium: 9.1 mg/dL (ref 8.9–10.3)
GFR calc Af Amer: >60 mL/min (ref >60)
Glucose, Bld: 96 mg/dL (ref 65–99)
Potassium: 3.7 mmol/L (ref 3.5–5.1)
SODIUM: 139 mmol/L (ref 135–145)

## 2016-08-25 LAB — CBC
HCT: 38 % — ABNORMAL LOW (ref 39.0–52.0)
Hemoglobin: 12.5 g/dL — ABNORMAL LOW (ref 13.0–17.0)
MCH: 29.7 pg (ref 26.0–34.0)
MCHC: 32.9 g/dL (ref 30.0–36.0)
MCV: 90.3 fL (ref 78.0–100.0)
PLATELETS: 306 10*3/uL (ref 150–400)
RBC: 4.21 MIL/uL — ABNORMAL LOW (ref 4.22–5.81)
RDW: 13.1 % (ref 11.5–15.5)
WBC: 7.4 10*3/uL (ref 4.0–10.5)

## 2016-08-25 MED ORDER — ASPIRIN 81 MG PO TBEC: 81.0000 mg | DELAYED_RELEASE_TABLET | Freq: Every day | ORAL | Status: AC

## 2016-08-25 MED ORDER — CLOPIDOGREL BISULFATE 75 MG PO TABS: 75.0000 mg | ORAL_TABLET | ORAL | 11 refills | Status: DC

## 2016-08-25 MED ORDER — METOPROLOL TARTRATE 25 MG PO TABS: 12.5000 mg | ORAL_TABLET | Freq: Two times a day (BID) | ORAL | 3 refills | Status: DC

## 2016-08-25 MED ORDER — POTASSIUM CHLORIDE CRYS ER 20 MEQ PO TBCR
40.0000 meq | EXTENDED_RELEASE_TABLET | Freq: Once | ORAL | Status: AC
  Administered 2016-08-25: 09:00:00 40 meq via ORAL
  Filled 2016-08-25: qty 2

## 2016-08-25 MED ORDER — NITROGLYCERIN 0.4 MG SL SUBL: 0.4000 mg | SUBLINGUAL_TABLET | SUBLINGUAL | 3 refills | Status: DC | PRN

## 2016-08-25 NOTE — Progress Notes (Signed)
Patient discharged at this time. Wayne Santos took patient to security with proper paperwork to obtain his belongings prior to discharge home at this time.

## 2016-08-25 NOTE — Progress Notes (Signed)
CARDIAC REHAB PHASE I   PRE:  Rate/Rhythm:71 sR  BP:  Sitting: 148/75        SaO2: 100 RA  MODE:  Ambulation: 500 ft   POST:  Rate/Rhythm: 78 SR  BP:  Sitting: 138/85         SaO2: 100 RA  Pt ambulated 500 ft on RA, handheld assist, steady gait, tolerated well with no complaints. Completed MI/stent education.  Reviewed risk factors, tobacco cessation (gave pt fake cigarette), MI book, anti-platelet therapy, stent card, activity restrictions, ntg, exercise, heart healthy diet, carb counting, and phase 2 cardiac rehab. Pt verbalized understanding, asked many questions, mostly receptive to education. Pt agrees to phase 2 cardiac rehab referral, will send to [facility] per pt request. Pt up ad lib in room, awaiting discharge.  [id number] Joylene Grapes, RN, BSN 08/25/2016 10:31 AM

## 2016-08-25 NOTE — Discharge Summary (Addendum)
Discharge Summary    Patient ID: Wayne Santos,  MRN: [id number], DOB/AGE: October 13, 1950 66 y.o.  Admit date: 08/23/2016 Discharge date: 08/25/2016  Primary Care Provider: Alva Garnet Primary Cardiologist: Tresa Endo   Discharge Diagnoses    Active Problems:   NSTEMI (non-ST elevated myocardial infarction) [facility])   SMOKER   Essential hypertension   Hyperlipidemia   Allergies Allergies  Allergen Reactions  . Ace Inhibitors     COUGH    Diagnostic Studies/Procedures    Cath: 08/23/16  Conclusion     Prox RCA lesion, 30 %stenosed.  Mid RCA lesion, 30 %stenosed.  Prox LAD lesion, 40 %stenosed.  Ost 2nd Diag to 2nd Diag lesion, 80 %stenosed.  Ost 1st Diag to 1st Diag lesion, 60 %stenosed.  The left ventricular systolic function is normal.  LV end diastolic pressure is normal.  The left ventricular ejection fraction is 55-65% by visual estimate.  A STENT RESOLUTE ONYX 2.5X30 drug eluting stent was successfully placed.  Mid Cx lesion, 95 %stenosed.  Post intervention, there is a 0% residual stenosis.   1. Severe one-vessel coronary artery disease involving the mid left circumflex. There is also moderate proximal LAD/diagonal disease and mild RCA disease. 2. Normal LV systolic function and normal left ventricular end-diastolic pressure. 3. Successful angioplasty and drug-eluting stent placement to the mid LAD.  Recommendations: Dual antiplatelet therapy for at least one year. Aggressive treatment of risk factors and cardiac rehabilitation.   _____________   History of Present Illness     Wayne Santos is a 66 yo man with PMH HIV, well controlled on antivirals, hypertension, and recent total hip arthroplasty who was brought to the ER by EMS after sudden onset chest pain 4/17. Patient reports that he has had very mild twinges of chest pain for several days, but on 08/23/16 he had 9/10 crushing substernal chest pain. He had been working in the garden all  day, but the pain came on after. He received NG by EMS, and he reported he had been chest pain free since that time. He denied any prior history of CAD. He does smoke 1/2 ppd. He has lost 20 lbs and has been working on improving his diet and exercise. He does not have a family history of heart disease.   He had been doing well post total hip arthroplasty. He was taking full dose aspirin for four weeks post-operatively for DVT prophylaxis.   Hospital Course   He was admitted with plans for cardiac cath. Troponin peaked at 0.27. Underwent cardiac cath with Dr. Kirke Corin as noted above with DES x1 placed to mCx (95%) with )% post intervention. EF 55-65% by visual estimate. Plan for DAPT with ASA/plavix for one year. Plavix was chosen due to DDI with prezcobix. Continued on statin, but started on low dose metoprolol 12.5mg  BID. Reported being on lisinopril in the past but intolerant due to cough. Consider ARB at outpatient follow up. Labs were stable post cath. Worked well with cardiac rehab. Smoking cessation was strongly encouraged.   He was seen 08/25/16 by Dr. Allyson Sabal and determined stable for discharge. Follow up in the office has been arranged. Medications are listed below. Case manager assisted with medications this admission.   Physical Exam:  General: Well developed, well nourished, male appearing in no acute distress. Head: Normocephalic, atraumatic.  Neck: Supple without bruits, JVD. Lungs:  Resp regular and unlabored, CTA. Heart: RRR, S1, S2, no S3, S4, or murmur; no rub. Abdomen: Soft, non-tender, non-distended with normoactive  bowel sounds. No hepatomegaly. No rebound/guarding. No obvious abdominal masses. Extremities: No clubbing, cyanosis, edema. Distal pedal pulses are 2+ bilaterally. R radial cath site stable without bruising or hematoma Neuro: Alert and oriented X 3. Moves all extremities spontaneously. Psych: Normal affect. _____________  Discharge Vitals Blood pressure 134/66,  pulse 64, temperature 97.6 F (36.4 C), temperature source Oral, resp. rate 15, height  (1.753 m), weight 174 lb (78.9 kg), SpO2 98 %.  Filed Weights   08/24/16 0600 08/25/16 0639  Weight: 171 lb 8.3 oz (77.8 kg) 174 lb (78.9 kg)    Labs & Radiologic Studies    CBC  Recent Labs  08/23/16 2140 08/24/16 0648 08/25/16 0306  WBC 8.0 7.3 7.4  NEUTROABS 4.9  --   --   HGB 12.6* 12.6* 12.5*  HCT 37.3* 37.8* 38.0*  MCV 89.9 90.6 90.3  PLT 303 302 306   Basic Metabolic Panel  Recent Labs  08/24/16 0648 08/25/16 0306  NA 141 139  K 2.9* 3.7  CL 107 108  CO2 28 25  GLUCOSE 125* 96  BUN 8 10  CREATININE 0.82 0.88  CALCIUM 9.1 9.1   Liver Function Tests  Recent Labs  08/23/16 2140  AST 21  ALT 17  ALKPHOS 106  BILITOT 0.3  PROT 6.7  ALBUMIN 3.7   No results for input(s): LIPASE, AMYLASE in the last 72 hours. Cardiac Enzymes  Recent Labs  08/24/16 0214 08/24/16 0648  TROPONINI 0.23* 0.27*   BNP Invalid input(s): POCBNP D-Dimer No results for input(s): DDIMER in the last 72 hours. Hemoglobin A1C  Recent Labs  08/24/16 0648  HGBA1C 6.1*   Fasting Lipid Panel  Recent Labs  08/24/16 0648  CHOL 134  HDL 30*  LDLCALC 79  TRIG 161  CHOLHDL 4.5   Thyroid Function Tests No results for input(s): TSH, T4TOTAL, T3FREE, THYROIDAB in the last 72 hours.  Invalid input(s): FREET3 _____________  Dg Chest 2 View  Result Date: 08/23/2016 CLINICAL DATA:  Acute onset of central chest pain and shortness of breath. Initial encounter. EXAM: CHEST  2 VIEW COMPARISON:  Chest radiograph performed 06/27/2016 FINDINGS: The lungs are well-aerated and clear. There is no evidence of focal opacification, pleural effusion or pneumothorax. The heart is normal in size; the mediastinal contour is within normal limits. No acute osseous abnormalities are seen. IMPRESSION: No acute cardiopulmonary process seen. Electronically Signed   By: Roanna Raider M.D.   On: 08/23/2016  22:26   Disposition   Pt is being discharged home today in good condition.  Follow-up Plans & Appointments    Follow-up Information    Nicolasa Ducking, NP Follow up on 09/06/2016.   Specialties:  Nurse Practitioner, Cardiology, Radiology Why:  at 8:30am for your follow up appt Contact information: [address] [place] [address] [phone]          Discharge Instructions    AMB Referral to Cardiac Rehabilitation - Phase II    Complete by:  As directed    Diagnosis:   NSTEMI Coronary Stents     Amb Referral to Cardiac Rehabilitation    Complete by:  As directed    Diagnosis:   Coronary Stents NSTEMI     Call MD for:  redness, tenderness, or signs of infection (pain, swelling, redness, odor or green/yellow discharge around incision site)    Complete by:  As directed    Diet - low sodium heart healthy    Complete by:  As directed  Discharge instructions    Complete by:  As directed    Radial Site Care Refer to this sheet in the next few weeks. These instructions provide you with information on caring for yourself after your procedure. Your caregiver may also give you more specific instructions. Your treatment has been planned according to current medical practices, but problems sometimes occur. Call your caregiver if you have any problems or questions after your procedure. HOME CARE INSTRUCTIONS You may shower the day after the procedure.Remove the bandage (dressing) and gently wash the site with plain soap and water.Gently pat the site dry.  Do not apply powder or lotion to the site.  Do not submerge the affected site in water for 3 to 5 days.  Inspect the site at least twice daily.  Do not flex or bend the affected arm for 24 hours.  No lifting over 5 pounds (2.3 kg) for 5 days after your procedure.  Do not drive home if you are discharged the same day of the procedure. Have someone else drive you.  You may drive 24 hours after the procedure  unless otherwise instructed by your caregiver.  What to expect: Any bruising will usually fade within 1 to 2 weeks.  Blood that collects in the tissue (hematoma) may be painful to the touch. It should usually decrease in size and tenderness within 1 to 2 weeks.  SEEK IMMEDIATE MEDICAL CARE IF: You have unusual pain at the radial site.  You have redness, warmth, swelling, or pain at the radial site.  You have drainage (other than a small amount of blood on the dressing).  You have chills.  You have a fever or persistent symptoms for more than 72 hours.  You have a fever and your symptoms suddenly get worse.  Your arm becomes pale, cool, tingly, or numb.  You have heavy bleeding from the site. Hold pressure on the site.   Increase activity slowly    Complete by:  As directed       Discharge Medications   Current Discharge Medication List    START taking these medications   Details  aspirin EC 81 MG EC tablet Take 1 tablet (81 mg total) by mouth daily.    clopidogrel (PLAVIX) 75 MG tablet Take 1 tablet (75 mg total) by mouth daily. Qty: 30 tablet, Refills: 11    metoprolol tartrate (LOPRESSOR) 25 MG tablet Take 0.5 tablets (12.5 mg total) by mouth 2 (two) times daily. Qty: 60 tablet, Refills: 3    nitroGLYCERIN (NITROSTAT) 0.4 MG SL tablet Place 1 tablet (0.4 mg total) under the tongue every 5 (five) minutes x 3 doses as needed for chest pain. Qty: 25 tablet, Refills: 3      CONTINUE these medications which have NOT CHANGED   Details  amLODipine (NORVASC) 5 MG tablet TAKE 1 TABLET BY MOUTH DAILY Qty: 30 tablet, Refills: 5    cetirizine (ZYRTEC) 10 MG tablet Take 1 tablet (10 mg total) by mouth at bedtime. Qty: 30 tablet, Refills: 11    DESCOVY 200-25 MG tablet TAKE 1 TABLET BY MOUTH ONCE DAILY Qty: 30 tablet, Refills: 5   Associated Diagnoses: Human immunodeficiency virus I infection (HCC)    mometasone (NASONEX) 50 MCG/ACT nasal spray Place 2 sprays into the nose  daily. Qty: 17 g, Refills: 12    pravastatin (PRAVACHOL) 40 MG tablet TAKE 1 TABLET BY MOUTH DAILY Qty: 30 tablet, Refills: 5   Associated Diagnoses: Other hyperlipidemia    PREZCOBIX 800-150 MG  tablet TAKE 1 TABLET BY MOUTH DAILY Qty: 30 tablet, Refills: 5   Associated Diagnoses: Human immunodeficiency virus I infection (HCC)    Vitamin D, Ergocalciferol, (DRISDOL) 50000 UNITS CAPS capsule Take 1 capsule (50,000 Units total) by mouth every 7 (seven) days. Qty: 8 capsule, Refills: 0   Associated Diagnoses: AVN (avascular necrosis of bone) (HCC)         Aspirin prescribed at discharge?  Yes High Intensity Statin Prescribed? (Lipitor 40-80mg  or Crestor 20-40mg ): Yes Beta Blocker Prescribed? Yes For EF <40%, was ACEI/ARB Prescribed? No: Consider ARB at outpatient follow up ADP Receptor Inhibitor Prescribed? (i.e. Plavix etc.-Includes Medically Managed Patients): Yes For EF <40%, Aldosterone Inhibitor Prescribed? No EF ok Was EF assessed during THIS hospitalization? Yes Was Cardiac Rehab II ordered? (Included Medically managed Patients): Yes   Outstanding Labs/Studies   N/A  Duration of Discharge Encounter   Greater than 30 minutes including physician time.  Signed, Laverda Page NP-C 08/25/2016, 10:32 AM  Agree with note by Laverda Page NP-C  OK for DC home. S/P RCA PCI/DES performed radfially. No CP. On approp meds. DAPT. Smoking cessation. Arrange MLP OP OV 7-10 days then ROV with Dr Wonda Cheng 3-4 weeks.    Runell Gess, M.D., FACP, [facility], [facility] [address] [place], [place]  [address]  [phone] 08/25/2016 10:32 AM

## 2016-08-25 NOTE — Care Management Note (Signed)
Case Management Note  Patient Details  Name: Wayne Santos MRN: [id number] Date of Birth: 02/11/1951  Subjective/Objective:     s/p coronary stent, will be on plavix and asa, NCM will follow for dc needs.               Action/Plan:   Expected Discharge Date:                  Expected Discharge Plan:  Home/Self Care  In-House Referral:     Discharge planning Services  CM Consult  Post Acute Care Choice:    Choice offered to:     DME Arranged:    DME Agency:     HH Arranged:    HH Agency:     Status of Service:  Completed, signed off  If discussed at [employer] of Stay Meetings, dates discussed:    Additional Comments:  Leone Haven, RN 08/25/2016, 6:58 AM

## 2016-08-26 ENCOUNTER — Telehealth (HOSPITAL_COMMUNITY): Payer: Self-pay | Admitting: Internal Medicine

## 2016-08-26 NOTE — Telephone Encounter (Signed)
Verified [employer] Medicare insurance benefits through [employer] No coinsurance or Deductible. Copay $20.00 Out of Pocket $4400.00, pt has met $496.39... Reference [phone]... KJ

## 2016-09-01 ENCOUNTER — Other Ambulatory Visit: Payer: Self-pay | Admitting: Infectious Disease

## 2016-09-06 ENCOUNTER — Encounter: Payer: Self-pay | Admitting: Nurse Practitioner

## 2016-09-06 ENCOUNTER — Telehealth (HOSPITAL_COMMUNITY): Payer: Self-pay | Admitting: Internal Medicine

## 2016-09-06 ENCOUNTER — Ambulatory Visit (INDEPENDENT_AMBULATORY_CARE_PROVIDER_SITE_OTHER): Payer: Medicare Other | Admitting: Nurse Practitioner

## 2016-09-06 VITALS — BP 130/64 | HR 47 | Ht 68.5 in | Wt 179.4 lb

## 2016-09-06 DIAGNOSIS — E785 Hyperlipidemia, unspecified: Secondary | ICD-10-CM

## 2016-09-06 DIAGNOSIS — I10 Essential (primary) hypertension: Secondary | ICD-10-CM

## 2016-09-06 DIAGNOSIS — I214 Non-ST elevation (NSTEMI) myocardial infarction: Secondary | ICD-10-CM

## 2016-09-06 DIAGNOSIS — I2510 Atherosclerotic heart disease of native coronary artery without angina pectoris: Secondary | ICD-10-CM | POA: Diagnosis not present

## 2016-09-06 NOTE — Patient Instructions (Signed)
Medication Instructions: Your physician recommends that you continue on your current medications as directed. Please refer to the Current Medication list given to you today.   Follow-Up: Your physician recommends that you schedule a follow-up appointment in: 2-3 months with Dr. Tresa Endo.  If you need a refill on your cardiac medications before your next appointment, please call your pharmacy.

## 2016-09-06 NOTE — Progress Notes (Signed)
Office Visit    Patient Name: Wayne Santos Date of Encounter: 09/06/2016  Primary Care Provider:  Alva Garnet., MD Primary Cardiologist:  Bishop Limbo, MD   Chief Complaint    66 year old male with prior history of HIV, hypertension, hyperlipidemia, and recent non-STEMI status post stenting of the left circumflex, who presents for follow-up.  Past Medical History    Past Medical History:  Diagnosis Date  . Anemia   . AVN (avascular necrosis of bone) (HCC)   . Benign paroxysmal positional vertigo 06/15/2015  . CAD (coronary artery disease), native coronary artery    a. 08/24/16 NSTEMI/PCI: LM nl, LAD 40p, D1 60ost, D2 80ost-small, LCX 36m (2.5x30 ONyx DES), OM1/2 min irregs, OM3 nl, RCA 30p/m, RPDA/RPAV/RPL1/RPL2/RPL3 nl, EF 55-65%.  . Childhood asthma   . GERD (gastroesophageal reflux disease)   . Glaucoma   . History of pneumonia    "4 times in my life" (08/24/2016)  . HIV infection (HCC) dx'd in the 1990s   "I've been undetectable 19 times" (08/24/2016)  . Hyperlipidemia   . Hypertension   . Memory impairment 06/15/2015  . Osteoporosis   . Primary localized osteoarthrosis of left hip 07/05/2016  . Seasonal allergies   . Tobacco abuse    Past Surgical History:  Procedure Laterality Date  . CARDIAC CATHETERIZATION    . CORONARY ANGIOPLASTY WITH STENT PLACEMENT  08/24/2016  . CORONARY STENT INTERVENTION N/A 08/24/2016   Procedure: Coronary Stent Intervention;  Surgeon: Iran Ouch, MD;  Location: [facility] INVASIVE CV LAB;  Service: Cardiovascular;  Laterality: N/A;  . HAND NERVE REPAIR Right 1990s?   "caught it in a machine; had to be reconstructed"  . JOINT REPLACEMENT    . LEFT HEART CATH AND CORONARY ANGIOGRAPHY N/A 08/24/2016   Procedure: Left Heart Cath and Coronary Angiography;  Surgeon: Iran Ouch, MD;  Location: [facility] INVASIVE CV LAB;  Service: Cardiovascular;  Laterality: N/A;  . TOTAL HIP ARTHROPLASTY Right   . TOTAL HIP ARTHROPLASTY Left 07/05/2016   Procedure: TOTAL HIP ARTHROPLASTY ANTERIOR APPROACH;  Surgeon: Marcene Corning, MD;  Location: [facility] OR;  Service: Orthopedics;  Laterality: Left;    Allergies  Allergies  Allergen Reactions  . Ace Inhibitors Cough    History of Present Illness    66 year old male with the above complex past medical history including HIV, hypertension, hyperlipidemia, and avascular necrosis of the hip status post recent total hip arthroplasty. He was recently admitted to [facility] in the setting of chest pain and elevated troponin. And when diagnostic catheterization revealing severe small vessel diagonal disease as well as significant mid left circumflex disease. The left circumflex was successfully treated using a drug-eluting stent. Postprocedure course was uneventful.  Since discharge, he has done well. He has been walking some without any chest pain or dyspnea. His right wrist has healed well. He has been compliant with medications and tolerating them well.  He denies palpitations, pnd, orthopnea, n, v, dizziness, syncope, edema, weight gain, or early satiety. He does hope to enroll in cardiac rehabilitation.  Home Medications    Prior to Admission medications   Medication Sig Start Date End Date Taking? Authorizing Provider  amLODipine (NORVASC) 5 MG tablet TAKE 1 TABLET BY MOUTH DAILY 03/04/16   Randall Hiss, MD  aspirin EC 81 MG EC tablet Take 1 tablet (81 mg total) by mouth daily. 08/25/16   Arty Baumgartner, NP  cetirizine (ZYRTEC) 10 MG tablet Take 1 tablet (10 mg total) by  mouth at bedtime. Patient taking differently: Take 10 mg by mouth daily as needed for allergies.  11/02/15   Sherren Mocha, MD  clopidogrel (PLAVIX) 75 MG tablet Take 1 tablet (75 mg total) by mouth daily. 08/25/16   Arty Baumgartner, NP  DESCOVY 200-25 MG tablet TAKE 1 TABLET BY MOUTH ONCE DAILY 05/04/16   Randall Hiss, MD  metoprolol tartrate (LOPRESSOR) 25 MG tablet Take 0.5 tablets (12.5 mg total) by mouth 2 (two)  times daily. 08/25/16   Arty Baumgartner, NP  mometasone (NASONEX) 50 MCG/ACT nasal spray Place 2 sprays into the nose daily. Patient taking differently: Place 2 sprays into the nose daily as needed (allergies).  11/02/15   Sherren Mocha, MD  nitroGLYCERIN (NITROSTAT) 0.4 MG SL tablet Place 1 tablet (0.4 mg total) under the tongue every 5 (five) minutes x 3 doses as needed for chest pain. 08/25/16   Arty Baumgartner, NP  pravastatin (PRAVACHOL) 40 MG tablet TAKE 1 TABLET BY MOUTH DAILY 05/31/16   Randall Hiss, MD  PREZCOBIX 800-150 MG tablet TAKE 1 TABLET BY MOUTH DAILY 05/04/16   Randall Hiss, MD  Vitamin D, Ergocalciferol, (DRISDOL) 50000 UNITS CAPS capsule Take 1 capsule (50,000 Units total) by mouth every 7 (seven) days. 04/08/13   Randall Hiss, MD    Review of Systems    He denies chest pain, palpitations, dyspnea, pnd, orthopnea, n, v, dizziness, syncope, edema, weight gain, or early satiety.  All other systems reviewed and are otherwise negative except as noted above.  Physical Exam    VS:  BP 130/64   Pulse (!) 47   Ht 5' 8.5" (1.74 m)   Wt 179 lb 6.4 oz (81.4 kg)   BMI 26.88 kg/m  , BMI Body mass index is 26.88 kg/m. GEN: Well nourished, well developed, in no acute distress.  HEENT: normal.  Neck: Supple, no JVD, carotid bruits, or masses. Cardiac: RRR, no murmurs, rubs, or gallops. No clubbing, cyanosis, edema.  Radials/DP/PT 2+ and equal bilaterally. Right wrist catheterization site without bleeding, bruit, or hematoma. Respiratory:  Respirations regular and unlabored, clear to auscultation bilaterally. GI: Soft, nontender, nondistended, BS + x 4. MS: no deformity or atrophy. Skin: warm and dry, no rash. Neuro:  Strength and sensation are intact. Psych: Normal affect.  Accessory Clinical Findings    ECG - Sinus bradycardia, 47, LVH, no acute ST or T changes.  Assessment & Plan    1.  Non-STEMI, subsequent episode of care/coronary artery disease: Status  post recent admission with chest pain and elevated troponin. Catheterization revealed severe left circumflex disease which was successfully treated with a drug-eluting stent. He has been doing well post hospitalization without chest pain or dyspnea. He has been compliant with medications and does plan to enroll in cardiac rehabilitation. He remains on aspirin, statin, beta blocker, and Plavix.  2. Sinus bradycardia: Heart rate is 47 on low-dose beta blocker following non-STEMI. He is asymptomatic and has good exercise tolerance. I will not make any changes to his therapy today.   3. Essential hypertension: Blood pressure is stable on beta blocker and amlodipine therapy.  4. Hyperlipidemia: LDL was 79 and April 18. He is on Pravachol therapy.  5. HIV: Followed closely by infectious disease.  6. Tobacco abuse: He has not smoked since his discharge. I congratulated him on this. I advised that he remain off of cigarettes.  7. Disposition: Follow-up in clinic with Dr. Tresa Endo in  2-3 months.   Nicolasa Ducking, NP 09/06/2016, 9:11 AM

## 2016-09-09 ENCOUNTER — Encounter: Payer: Self-pay | Admitting: Family Medicine

## 2016-09-09 ENCOUNTER — Other Ambulatory Visit: Payer: Self-pay | Admitting: Infectious Disease

## 2016-09-09 ENCOUNTER — Ambulatory Visit (INDEPENDENT_AMBULATORY_CARE_PROVIDER_SITE_OTHER): Payer: Medicare Other | Admitting: Family Medicine

## 2016-09-09 VITALS — BP 147/50 | HR 71 | Temp 99.6°F | Resp 16 | Ht 68.5 in | Wt 180.0 lb

## 2016-09-09 DIAGNOSIS — R059 Cough, unspecified: Secondary | ICD-10-CM

## 2016-09-09 DIAGNOSIS — R6883 Chills (without fever): Secondary | ICD-10-CM | POA: Diagnosis not present

## 2016-09-09 DIAGNOSIS — J111 Influenza due to unidentified influenza virus with other respiratory manifestations: Secondary | ICD-10-CM | POA: Diagnosis not present

## 2016-09-09 DIAGNOSIS — R05 Cough: Secondary | ICD-10-CM | POA: Diagnosis not present

## 2016-09-09 DIAGNOSIS — R52 Pain, unspecified: Secondary | ICD-10-CM | POA: Diagnosis not present

## 2016-09-09 LAB — POC INFLUENZA A&B (BINAX/QUICKVUE)
INFLUENZA A, POC: NEGATIVE
INFLUENZA B, POC: POSITIVE — AB

## 2016-09-09 MED ORDER — OSELTAMIVIR PHOSPHATE 75 MG PO CAPS: 75.0000 mg | ORAL_CAPSULE | Freq: Two times a day (BID) | ORAL | 0 refills | Status: DC

## 2016-09-09 NOTE — Progress Notes (Signed)
Subjective:  By signing my name below, I, Essence Howell, attest that this documentation has been prepared under the direction and in the presence of Shade Flood, MD Electronically Signed: Charline Bills, ED Scribe 09/09/2016 at 4:21 PM.   Patient ID: Wayne Santos, male    DOB: 09/12/1950, 66 y.o.   MRN: [id number]  Chief Complaint  Patient presents with  . Nasal Congestion    Symptoms began last night   . chest congestion  . Chills   HPI Wayne Santos is a 66 y.o. male who presents to Primary Care at [facility] complaining of nasal congestion, chest congestion and chills onset yesterday. H/o multiple medical problems including HTN, memory impairment, HIV (viral load undetected, CD4 1010 on 05/12/16) and CAD with recent STEMI. PCP: Alva Garnet., MD.   Pt reports associated symptoms of postnasal drip, sneezing, generalized body aches, sharp chest pain only with deep breathing. He states that chest pain is not similar to chest pain he experienced with MI. Pt also states that it "feels as if pins are sticking him in his back" with deep breaths as well. He started Zyrtec last night with minimal relief. Pt denies fever and wheezing. He did receive a flu vaccine this season. Pt reports a h/o asthma that has not required an inhaler. No h/o COPD.   Patient Active Problem List   Diagnosis Date Noted  . NSTEMI (non-ST elevated myocardial infarction) (HCC) 08/24/2016  . Primary localized osteoarthrosis of left hip 07/05/2016  . Primary osteoarthritis of left hip 07/05/2016  . Encounter for long-term (current) use of medications 05/18/2016  . Routine screening for STI (sexually transmitted infection) 05/18/2016  . Benign paroxysmal positional vertigo 06/15/2015  . Memory impairment 06/15/2015  . Benign essential HTN 06/02/2014  . Hyperglycemia 02/04/2013  . AVN (avascular necrosis of bone) (HCC)   . Glaucoma   . Hyperlipidemia 03/16/2011  . ONYCHOMYCOSIS, TOENAILS 03/24/2010  .  SMOKER 03/24/2010  . 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 POSTOPERATIVE INFECTION 07/10/2008  . POSTNASAL DRIP SYNDROME 12/13/2007  . URINARY TRACT INFECTION SITE NOT SPECIFIED 04/04/2007  . BOILS, RECURRENT 11/14/2006  . HIV disease (HCC) 09/12/2006  . PANIC ATTACK 09/12/2006  . Essential hypertension 09/12/2006  . Aseptic necrosis of bone (HCC) 09/12/2006   Past Medical History:  Diagnosis Date  . Anemia   . AVN (avascular necrosis of bone) (HCC)   . Benign paroxysmal positional vertigo 06/15/2015  . CAD (coronary artery disease), native coronary artery    a. 08/24/16 NSTEMI/PCI: LM nl, LAD 40p, D1 60ost, D2 80ost-small, LCX 34m (2.5x30 ONyx DES), OM1/2 min irregs, OM3 nl, RCA 30p/m, RPDA/RPAV/RPL1/RPL2/RPL3 nl, EF 55-65%.  . Childhood asthma   . GERD (gastroesophageal reflux disease)   . Glaucoma   . History of pneumonia    "4 times in my life" (08/24/2016)  . HIV infection (HCC) dx'd in the 1990s   "I've been undetectable 19 times" (08/24/2016)  . Hyperlipidemia   . Hypertension   . Memory impairment 06/15/2015  . Osteoporosis   . Primary localized osteoarthrosis of left hip 07/05/2016  . Seasonal allergies   . Tobacco abuse    Past Surgical History:  Procedure Laterality Date  . CARDIAC CATHETERIZATION    . CORONARY ANGIOPLASTY WITH STENT PLACEMENT  08/24/2016  . CORONARY STENT INTERVENTION N/A 08/24/2016   Procedure: Coronary Stent Intervention;  Surgeon: Iran Ouch, MD;  Location: [facility] INVASIVE CV LAB;  Service: Cardiovascular;  Laterality: N/A;  . HAND NERVE REPAIR Right 1990s?   "  caught it in a machine; had to be reconstructed"  . JOINT REPLACEMENT    . LEFT HEART CATH AND CORONARY ANGIOGRAPHY N/A 08/24/2016   Procedure: Left Heart Cath and Coronary Angiography;  Surgeon: Iran Ouch, MD;  Location: [facility] INVASIVE CV LAB;  Service: Cardiovascular;  Laterality: N/A;  . TOTAL HIP ARTHROPLASTY Right   . TOTAL HIP ARTHROPLASTY Left 07/05/2016   Procedure: TOTAL HIP ARTHROPLASTY  ANTERIOR APPROACH;  Surgeon: Marcene Corning, MD;  Location: [facility] OR;  Service: Orthopedics;  Laterality: Left;   Allergies  Allergen Reactions  . Ace Inhibitors Cough   Prior to Admission medications   Medication Sig Start Date End Date Taking? Authorizing Provider  amLODipine (NORVASC) 5 MG tablet TAKE 1 TABLET BY MOUTH EVERY DAY 09/09/16  Yes Randall Hiss, MD  aspirin EC 81 MG EC tablet Take 1 tablet (81 mg total) by mouth daily. 08/25/16  Yes Arty Baumgartner, NP  cetirizine (ZYRTEC) 10 MG tablet Take 1 tablet (10 mg total) by mouth at bedtime. Patient taking differently: Take 10 mg by mouth as needed for allergies.  11/02/15  Yes Sherren Mocha, MD  clopidogrel (PLAVIX) 75 MG tablet Take 1 tablet (75 mg total) by mouth daily. 08/25/16  Yes Arty Baumgartner, NP  DESCOVY 200-25 MG tablet TAKE 1 TABLET BY MOUTH ONCE DAILY 05/04/16  Yes Randall Hiss, MD  methocarbamol (ROBAXIN) 500 MG tablet Take 1 tablet by mouth as needed. 08/04/16  Yes Historical Provider, MD  metoprolol tartrate (LOPRESSOR) 25 MG tablet Take 0.5 tablets (12.5 mg total) by mouth 2 (two) times daily. 08/25/16  Yes Arty Baumgartner, NP  mometasone (NASONEX) 50 MCG/ACT nasal spray Place 2 sprays into the nose daily. Patient taking differently: Place 2 sprays into the nose daily as needed (allergies).  11/02/15  Yes Sherren Mocha, MD  nitroGLYCERIN (NITROSTAT) 0.4 MG SL tablet Place 1 tablet (0.4 mg total) under the tongue every 5 (five) minutes x 3 doses as needed for chest pain. 08/25/16  Yes Arty Baumgartner, NP  oxyCODONE-acetaminophen (PERCOCET/ROXICET) 5-325 MG tablet Take 1 tablet by mouth daily as needed. 08/04/16  Yes Historical Provider, MD  pravastatin (PRAVACHOL) 40 MG tablet TAKE 1 TABLET BY MOUTH DAILY 05/31/16  Yes Randall Hiss, MD  PREZCOBIX 800-150 MG tablet TAKE 1 TABLET BY MOUTH DAILY 05/04/16  Yes Randall Hiss, MD  Vitamin D, Ergocalciferol, (DRISDOL) 50000 UNITS CAPS capsule Take 1 capsule  (50,000 Units total) by mouth every 7 (seven) days. 04/08/13  Yes Randall Hiss, MD   Social History   Social History  . Marital status: Single    Spouse name: N/A  . Number of children: N/A  . Years of education: N/A   Occupational History  . Not on file.   Social History Main Topics  . Smoking status: Former Smoker    Packs/day: 0.50    Years: 47.00    Types: Cigarettes  . Smokeless tobacco: Never Used  . Alcohol use 0.0 oz/week     Comment: 08/24/2016 "I'll have a couple drinks maybe 4 times/year"  . Drug use: Yes    Types: Marijuana     Comment: 08/24/2016 "nothing since my younger days"  . Sexual activity: Yes     Comment: declined condoms   Other Topics Concern  . Not on file   Social History Narrative  . No narrative on file   Review of Systems  Constitutional: Positive for  chills. Negative for fever.  HENT: Positive for congestion, postnasal drip and sneezing.   Respiratory: Negative for wheezing.   Cardiovascular: Positive for chest pain.  Musculoskeletal: Positive for myalgias.      Objective:   Physical Exam  Constitutional: He is oriented to person, place, and time. He appears well-developed and well-nourished.  HENT:  Head: Normocephalic and atraumatic.  Right Ear: Tympanic membrane, external ear and ear canal normal.  Left Ear: Tympanic membrane, external ear and ear canal normal.  Nose: No rhinorrhea. Right sinus exhibits no maxillary sinus tenderness and no frontal sinus tenderness. Left sinus exhibits no maxillary sinus tenderness and no frontal sinus tenderness.  Mouth/Throat: Oropharynx is clear and moist and mucous membranes are normal. No oropharyngeal exudate or posterior oropharyngeal erythema.  Eyes: Conjunctivae are normal. Pupils are equal, round, and reactive to light.  Neck: Neck supple.  Cardiovascular: Normal rate, regular rhythm, normal heart sounds and intact distal pulses.   No murmur heard. Pulmonary/Chest: Effort normal and  breath sounds normal. He has no wheezes. He has no rhonchi. He has no rales.  Lungs are clear to auscultation.  Abdominal: Soft. There is no tenderness.  Lymphadenopathy:    He has no cervical adenopathy.    He has no axillary adenopathy.  Neurological: He is alert and oriented to person, place, and time.  Skin: Skin is warm and dry. No rash noted.  Psychiatric: He has a normal mood and affect. His behavior is normal.  Vitals reviewed.  Vitals:   09/09/16 1232 09/09/16 1554  BP: 119/68 (!) 147/50  Pulse: 66 71  Resp: 16 16  Temp: 98.9 F (37.2 C) 99.6 F (37.6 C)  TempSrc: Oral Other (Comment)  SpO2: 97% 98%  Weight: 180 lb 6.4 oz (81.8 kg) 180 lb (81.6 kg)  Height: 5' 8.5" (1.74 m) 5' 8.5" (1.74 m)   Results for orders placed or performed in visit on 09/09/16  POC Influenza A&B(BINAX/QUICKVUE)  Result Value Ref Range   Influenza A, POC Negative Negative   Influenza B, POC Positive (A) Negative      Assessment & Plan:    Wayne Santos is a 66 y.o. male Cough - Plan: POC Influenza A&B(BINAX/QUICKVUE)  Chills - Plan: POC Influenza A&B(BINAX/QUICKVUE)  Body aches - Plan: POC Influenza A&B(BINAX/QUICKVUE)  Influenza with respiratory manifestation - Plan: oseltamivir (TAMIFLU) 75 MG capsule  Influenza B. Within time. For possible Tamiflu benefit. Potential side effects and potential benefits discussed. Chose to start Tamiflu.  -Tamiflu 75 mg twice a day, other symptomatic care and RTC precautions were discussed with handout given Meds ordered this encounter  Medications  . oseltamivir (TAMIFLU) 75 MG capsule    Sig: Take 1 capsule (75 mg total) by mouth 2 (two) times daily.    Dispense:  10 capsule    Refill:  0   Patient Instructions    Can start Tamiflu as discussed. Saline nasal spray at least 4 times per day if needed for nasal congestion, over the counter mucinex or mucinex DM as needed for cough, tylenol or ibuprofen over the counter for fever and body  aches, and drink plenty of fluids. Other information as in instructions below.  Return to the clinic or go to the nearest emergency room if any of your symptoms worsen or new symptoms occur. As we discussed, you are contagious until at least 24-48 hours after resolution of fever (off of fever reducing medications like Tylenol or Advil)   Influenza, Adult Influenza, more commonly known  as "the flu," is a viral infection that primarily affects the respiratory tract. The respiratory tract includes organs that help you breathe, such as the lungs, nose, and throat. The flu causes many common cold symptoms, as well as a high fever and body aches. The flu spreads easily from person to person (is contagious). Getting a flu shot (influenza vaccination) every year is the best way to prevent influenza. What are the causes? Influenza is caused by a virus. You can catch the virus by:  Breathing in droplets from an infected person's cough or sneeze.  Touching something that was recently contaminated with the virus and then touching your mouth, nose, or eyes. What increases the risk? The following factors may make you more likely to get the flu:  Not cleaning your hands frequently with soap and water or alcohol-based hand sanitizer.  Having close contact with many people during cold and flu season.  Touching your mouth, eyes, or nose without washing or sanitizing your hands first.  Not drinking enough fluids or not eating a healthy diet.  Not getting enough sleep or exercise.  Being under a high amount of stress.  Not getting a yearly (annual) flu shot. You may be at a higher risk of complications from the flu, such as a severe lung infection (pneumonia), if you:  Are over the age of 11.  Are pregnant.  Have a weakened disease-fighting system (immune system). You may have a weakened immune system if you:  Have HIV or AIDS.  Are undergoing chemotherapy.  Aretaking medicines that reduce the  activity of (suppress) the immune system.  Have a long-term (chronic) illness, such as heart disease, kidney disease, diabetes, or lung disease.  Have a liver disorder.  Are obese.  Have anemia. What are the signs or symptoms? Symptoms of this condition typically last 4-10 days and may include:  Fever.  Chills.  Headache, body aches, or muscle aches.  Sore throat.  Cough.  Runny or congested nose.  Chest discomfort and cough.  Poor appetite.  Weakness or tiredness (fatigue).  Dizziness.  Nausea or vomiting. How is this diagnosed? This condition may be diagnosed based on your medical history and a physical exam. Your health care provider may do a nose or throat swab test to confirm the diagnosis. How is this treated? If influenza is detected early, you can be treated with antiviral medicine that can reduce the length of your illness and the severity of your symptoms. This medicine may be given by mouth (orally) or through an IV tube that is inserted in one of your veins. The goal of treatment is to relieve symptoms by taking care of yourself at home. This may include taking over-the-counter medicines, drinking plenty of fluids, and adding humidity to the air in your home. In some cases, influenza goes away on its own. Severe influenza or complications from influenza may be treated in a hospital. Follow these instructions at home:  Take over-the-counter and prescription medicines only as told by your health care provider.  Use a cool mist humidifier to add humidity to the air in your home. This can make breathing easier.  Rest as needed.  Drink enough fluid to keep your urine clear or pale yellow.  Cover your mouth and nose when you cough or sneeze.  Wash your hands with soap and water often, especially after you cough or sneeze. If soap and water are not available, use hand sanitizer.  Stay home from work or school as told by  your health care provider. Unless you  are visiting your health care provider, try to avoid leaving home until your fever has been gone for 24 hours without the use of medicine.  Keep all follow-up visits as told by your health care provider. This is important. How is this prevented?  Getting an annual flu shot is the best way to avoid getting the flu. You may get the flu shot in late summer, fall, or winter. Ask your health care provider when you should get your flu shot.  Wash your hands often or use hand sanitizer often.  Avoid contact with people who are sick during cold and flu season.  Eat a healthy diet, drink plenty of fluids, get enough sleep, and exercise regularly. Contact a health care provider if:  You develop new symptoms.  You have:  Chest pain.  Diarrhea.  A fever.  Your cough gets worse.  You produce more mucus.  You feel nauseous or you vomit. Get help right away if:  You develop shortness of breath or difficulty breathing.  Your skin or nails turn a bluish color.  You have severe pain or stiffness in your neck.  You develop a sudden headache or sudden pain in your face or ear.  You cannot stop vomiting. This information is not intended to replace advice given to you by your health care provider. Make sure you discuss any questions you have with your health care provider. Document Released: 04/22/2000 Document Revised: 10/01/2015 Document Reviewed: 02/17/2015 Elsevier Interactive Patient Education  2017 [employer]      IF you received an x-ray today, you will receive an invoice from [facility]. Please contact [facility] at [phone] with questions or concerns regarding your invoice.   IF you received labwork today, you will receive an invoice from [facility]. Please contact [facility] at [phone] with questions or concerns regarding your invoice.   Our billing staff will not be able to assist you with questions regarding bills from these companies.  You  will be contacted with the lab results as soon as they are available. The fastest way to get your results is to activate your My Chart account. Instructions are located on the last page of this paperwork. If you have not heard from us regarding the results in 2 weeks, please contact this office.       I personally performed the services described in this documentation, which was scribed in my presence. The recorded information has been reviewed and considered for accuracy and completeness, addended by me as needed, and agree with information above.  Signed,   Meredith StaggersJeffrey Othel Dicostanzo, MD Primary Care at [facility].  09/09/16 5:36 PM

## 2016-09-09 NOTE — Patient Instructions (Addendum)
Can start Tamiflu as discussed. Saline nasal spray at least 4 times per day if needed for nasal congestion, over the counter mucinex or mucinex DM as needed for cough, tylenol or ibuprofen over the counter for fever and body aches, and drink plenty of fluids. Other information as in instructions below.  Return to the clinic or go to the nearest emergency room if any of your symptoms worsen or new symptoms occur. As we discussed, you are contagious until at least 24-48 hours after resolution of fever (off of fever reducing medications like Tylenol or Advil)   Influenza, Adult Influenza, more commonly known as "the flu," is a viral infection that primarily affects the respiratory tract. The respiratory tract includes organs that help you breathe, such as the lungs, nose, and throat. The flu causes many common cold symptoms, as well as a high fever and body aches. The flu spreads easily from person to person (is contagious). Getting a flu shot (influenza vaccination) every year is the best way to prevent influenza. What are the causes? Influenza is caused by a virus. You can catch the virus by:  Breathing in droplets from an infected person's cough or sneeze.  Touching something that was recently contaminated with the virus and then touching your mouth, nose, or eyes. What increases the risk? The following factors may make you more likely to get the flu:  Not cleaning your hands frequently with soap and water or alcohol-based hand sanitizer.  Having close contact with many people during cold and flu season.  Touching your mouth, eyes, or nose without washing or sanitizing your hands first.  Not drinking enough fluids or not eating a healthy diet.  Not getting enough sleep or exercise.  Being under a high amount of stress.  Not getting a yearly (annual) flu shot. You may be at a higher risk of complications from the flu, such as a severe lung infection (pneumonia), if you:  Are over the  age of 80.  Are pregnant.  Have a weakened disease-fighting system (immune system). You may have a weakened immune system if you:  Have HIV or AIDS.  Are undergoing chemotherapy.  Aretaking medicines that reduce the activity of (suppress) the immune system.  Have a long-term (chronic) illness, such as heart disease, kidney disease, diabetes, or lung disease.  Have a liver disorder.  Are obese.  Have anemia. What are the signs or symptoms? Symptoms of this condition typically last 4-10 days and may include:  Fever.  Chills.  Headache, body aches, or muscle aches.  Sore throat.  Cough.  Runny or congested nose.  Chest discomfort and cough.  Poor appetite.  Weakness or tiredness (fatigue).  Dizziness.  Nausea or vomiting. How is this diagnosed? This condition may be diagnosed based on your medical history and a physical exam. Your health care provider may do a nose or throat swab test to confirm the diagnosis. How is this treated? If influenza is detected early, you can be treated with antiviral medicine that can reduce the length of your illness and the severity of your symptoms. This medicine may be given by mouth (orally) or through an IV tube that is inserted in one of your veins. The goal of treatment is to relieve symptoms by taking care of yourself at home. This may include taking over-the-counter medicines, drinking plenty of fluids, and adding humidity to the air in your home. In some cases, influenza goes away on its own. Severe influenza or complications from influenza may  be treated in a hospital. Follow these instructions at home:  Take over-the-counter and prescription medicines only as told by your health care provider.  Use a cool mist humidifier to add humidity to the air in your home. This can make breathing easier.  Rest as needed.  Drink enough fluid to keep your urine clear or pale yellow.  Cover your mouth and nose when you cough or  sneeze.  Wash your hands with soap and water often, especially after you cough or sneeze. If soap and water are not available, use hand sanitizer.  Stay home from work or school as told by your health care provider. Unless you are visiting your health care provider, try to avoid leaving home until your fever has been gone for 24 hours without the use of medicine.  Keep all follow-up visits as told by your health care provider. This is important. How is this prevented?  Getting an annual flu shot is the best way to avoid getting the flu. You may get the flu shot in late summer, fall, or winter. Ask your health care provider when you should get your flu shot.  Wash your hands often or use hand sanitizer often.  Avoid contact with people who are sick during cold and flu season.  Eat a healthy diet, drink plenty of fluids, get enough sleep, and exercise regularly. Contact a health care provider if:  You develop new symptoms.  You have:  Chest pain.  Diarrhea.  A fever.  Your cough gets worse.  You produce more mucus.  You feel nauseous or you vomit. Get help right away if:  You develop shortness of breath or difficulty breathing.  Your skin or nails turn a bluish color.  You have severe pain or stiffness in your neck.  You develop a sudden headache or sudden pain in your face or ear.  You cannot stop vomiting. This information is not intended to replace advice given to you by your health care provider. Make sure you discuss any questions you have with your health care provider. Document Released: 04/22/2000 Document Revised: 10/01/2015 Document Reviewed: 02/17/2015 Elsevier Interactive Patient Education  2017 [employer]      IF you received an x-ray today, you will receive an invoice from [facility]. Please contact [facility] at [phone] with questions or concerns regarding your invoice.   IF you received labwork today, you will receive an  invoice from [facility]. Please contact [facility] at [phone] with questions or concerns regarding your invoice.   Our billing staff will not be able to assist you with questions regarding bills from these companies.  You will be contacted with the lab results as soon as they are available. The fastest way to get your results is to activate your My Chart account. Instructions are located on the last page of this paperwork. If you have not heard from us regarding the results in 2 weeks, please contact this office.

## 2016-09-30 ENCOUNTER — Telehealth (HOSPITAL_COMMUNITY): Payer: Self-pay

## 2016-09-30 NOTE — Telephone Encounter (Signed)
*  Updated insurance verification* [employer] - $20.00 co-payment, no deductible, out of pocket $4,400/$749.79 has been met, no co-insurance, no pre-authorization and no limit on visit. Passport/reference [phone].

## 2016-10-04 ENCOUNTER — Encounter (HOSPITAL_COMMUNITY)
Admission: RE | Admit: 2016-10-04 | Discharge: 2016-10-04 | Disposition: A | Discharge status: Home / Self Care | Payer: Medicare Other | Source: Ambulatory Visit | Attending: Cardiovascular Disease | Admitting: Cardiovascular Disease

## 2016-10-04 VITALS — BP 108/60 | HR 56 | Ht 67.75 in | Wt 187.4 lb

## 2016-10-04 DIAGNOSIS — E785 Hyperlipidemia, unspecified: Secondary | ICD-10-CM | POA: Diagnosis not present

## 2016-10-04 DIAGNOSIS — I213 ST elevation (STEMI) myocardial infarction of unspecified site: Secondary | ICD-10-CM | POA: Insufficient documentation

## 2016-10-04 DIAGNOSIS — Z87891 Personal history of nicotine dependence: Secondary | ICD-10-CM | POA: Insufficient documentation

## 2016-10-04 DIAGNOSIS — I214 Non-ST elevation (NSTEMI) myocardial infarction: Secondary | ICD-10-CM

## 2016-10-04 DIAGNOSIS — I10 Essential (primary) hypertension: Secondary | ICD-10-CM | POA: Insufficient documentation

## 2016-10-04 DIAGNOSIS — Z955 Presence of coronary angioplasty implant and graft: Secondary | ICD-10-CM | POA: Insufficient documentation

## 2016-10-04 DIAGNOSIS — K219 Gastro-esophageal reflux disease without esophagitis: Secondary | ICD-10-CM | POA: Insufficient documentation

## 2016-10-04 NOTE — Progress Notes (Signed)
Cardiac Individual Treatment Plan  Patient Details  Name: Wayne Santos MRN: [id number] Date of Birth: [birth year] Referring Provider:     CARDIAC REHAB PHASE II ORIENTATION from 10/04/2016 in [facility] CARDIAC REHAB  Referring Provider  Nicki Guadalajara MD      Initial Encounter Date:    CARDIAC REHAB PHASE II ORIENTATION from 10/04/2016 in [facility] CARDIAC REHAB  Date  10/04/16  Referring Provider  Nicki Guadalajara MD      Visit Diagnosis: 08/24/16 Status post coronary artery stent placement  08/23/16 NSTEMI (non-ST elevated myocardial infarction) (HCC)  Patient's Home Medications on Admission:  Current Outpatient Prescriptions:  .  amLODipine (NORVASC) 5 MG tablet, TAKE 1 TABLET BY MOUTH EVERY DAY, Disp: 30 tablet, Rfl: 0 .  aspirin EC 81 MG EC tablet, Take 1 tablet (81 mg total) by mouth daily., Disp: , Rfl:  .  clopidogrel (PLAVIX) 75 MG tablet, Take 1 tablet (75 mg total) by mouth daily., Disp: 30 tablet, Rfl: 11 .  DESCOVY 200-25 MG tablet, TAKE 1 TABLET BY MOUTH ONCE DAILY, Disp: 30 tablet, Rfl: 5 .  methocarbamol (ROBAXIN) 500 MG tablet, Take 1 tablet by mouth as needed., Disp: , Rfl: 0 .  metoprolol tartrate (LOPRESSOR) 25 MG tablet, Take 0.5 tablets (12.5 mg total) by mouth 2 (two) times daily., Disp: 60 tablet, Rfl: 3 .  mometasone (NASONEX) 50 MCG/ACT nasal spray, Place 2 sprays into the nose daily. (Patient taking differently: Place 2 sprays into the nose daily as needed (allergies). ), Disp: 17 g, Rfl: 12 .  Multiple Vitamin (MULTIVITAMIN) tablet, Take 1 tablet by mouth daily., Disp: , Rfl:  .  nitroGLYCERIN (NITROSTAT) 0.4 MG SL tablet, Place 1 tablet (0.4 mg total) under the tongue every 5 (five) minutes x 3 doses as needed for chest pain., Disp: 25 tablet, Rfl: 3 .  oxyCODONE-acetaminophen (PERCOCET/ROXICET) 5-325 MG tablet, Take 1 tablet by mouth daily as needed., Disp: , Rfl:  .  pravastatin (PRAVACHOL) 40 MG tablet, TAKE 1 TABLET  BY MOUTH DAILY, Disp: 30 tablet, Rfl: 5 .  PREZCOBIX 800-150 MG tablet, TAKE 1 TABLET BY MOUTH DAILY, Disp: 30 tablet, Rfl: 5 .  Vitamin D, Ergocalciferol, (DRISDOL) 50000 UNITS CAPS capsule, Take 1 capsule (50,000 Units total) by mouth every 7 (seven) days., Disp: 8 capsule, Rfl: 0 .  cetirizine (ZYRTEC) 10 MG tablet, Take 1 tablet (10 mg total) by mouth at bedtime. (Patient not taking: Reported on 10/04/2016), Disp: 30 tablet, Rfl: 11  Past Medical History: Past Medical History:  Diagnosis Date  . Anemia   . AVN (avascular necrosis of bone) (HCC)   . Benign paroxysmal positional vertigo 06/15/2015  . CAD (coronary artery disease), native coronary artery    a. 08/24/16 NSTEMI/PCI: LM nl, LAD 40p, D1 60ost, D2 80ost-small, LCX 7m (2.5x30 ONyx DES), OM1/2 min irregs, OM3 nl, RCA 30p/m, RPDA/RPAV/RPL1/RPL2/RPL3 nl, EF 55-65%.  . Childhood asthma   . GERD (gastroesophageal reflux disease)   . Glaucoma   . History of pneumonia    "4 times in my life" (08/24/2016)  . HIV infection (HCC) dx'd in the 1990s   "I've been undetectable 19 times" (08/24/2016)  . Hyperlipidemia   . Hypertension   . Memory impairment 06/15/2015  . Osteoporosis   . Primary localized osteoarthrosis of left hip 07/05/2016  . Seasonal allergies   . Tobacco abuse     Tobacco Use: History  Smoking Status  . Former Smoker  . Packs/day: 0.50  .  Years: 47.00  . Types: Cigarettes  Smokeless Tobacco  . Never Used    Labs: Recent Review Flowsheet Data    Labs for ITP Cardiac and Pulmonary Rehab Latest Ref Rng & Units 02/04/2013 12/18/2013 04/30/2014 01/26/2015 08/24/2016   Cholestrol 0 - 200 mg/dL - 161 096 045 409   LDLCALC 0 - 99 mg/dL - 79 87 811 79   HDL >91 mg/dL - 47(W) 29(F) 62(Z) 30(Q)   Trlycerides <150 mg/dL - 657(Q) 469 629 528   Hemoglobin A1c 4.8 - 5.6 % 6.2(H) 6.3(H) - - 6.1(H)      Capillary Blood Glucose: No results found for: GLUCAP   Exercise Target Goals: Date: 10/04/16  Exercise Program  Goal: Individual exercise prescription set with THRR, safety & activity barriers. Participant demonstrates ability to understand and report RPE using BORG scale, to self-measure pulse accurately, and to acknowledge the importance of the exercise prescription.  Exercise Prescription Goal: Starting with aerobic activity 30 plus minutes a day, 3 days per week for initial exercise prescription. Provide home exercise prescription and guidelines that participant acknowledges understanding prior to discharge.  Activity Barriers & Risk Stratification:     Activity Barriers & Cardiac Risk Stratification - 10/04/16 1345      Activity Barriers & Cardiac Risk Stratification   Activity Barriers Deconditioning;Muscular Weakness;Shortness of Breath   Cardiac Risk Stratification High      6 Minute Walk:     6 Minute Walk    Row Name 10/04/16 1552         6 Minute Walk   Phase Initial     Distance 1283 feet     Walk Time 6 minutes     # of Rest Breaks 0     MPH 2.43     METS 2.9     RPE 9     VO2 Peak 10.17     Symptoms No     Resting HR 56 bpm     Resting BP 108/60     Max Ex. HR 81 bpm     Max Ex. BP 132/70     2 Minute Post BP 124/72        Oxygen Initial Assessment:   Oxygen Re-Evaluation:   Oxygen Discharge (Final Oxygen Re-Evaluation):   Initial Exercise Prescription:     Initial Exercise Prescription - 10/04/16 1500      Date of Initial Exercise RX and Referring Provider   Date 10/04/16   Referring Provider Nicki Guadalajara MD     Treadmill   MPH 2.5   Grade 0   Minutes 10   METs 2.91     Bike   Level 0.7   Minutes 10   METs 2.55     NuStep   Level 3   SPM 80   Minutes 10   METs 2.3     Prescription Details   Frequency (times per week) 3   Duration Progress to 30 minutes of continuous aerobic without signs/symptoms of physical distress     Intensity   THRR 40-80% of Max Heartrate 62-124   Ratings of Perceived Exertion 11-13   Perceived Dyspnea  0-4     Progression   Progression Continue to progress workloads to maintain intensity without signs/symptoms of physical distress.     Resistance Training   Training Prescription Yes   Weight 3lbs   Reps 10-15      Perform Capillary Blood Glucose checks as needed.  Exercise Prescription Changes:   Exercise Comments:  Exercise Goals and Review:     Exercise Goals    Row Name 10/04/16 1346 10/04/16 1353           Exercise Goals   Increase Physical Activity Yes  -      Intervention Provide advice, education, support and counseling about physical activity/exercise needs.;Develop an individualized exercise prescription for aerobic and resistive training based on initial evaluation findings, risk stratification, comorbidities and participant's personal goals.  -      Expected Outcomes Achievement of increased cardiorespiratory fitness and enhanced flexibility, muscular endurance and strength shown through measurements of functional capacity and personal statement of participant.  -      Increase Strength and Stamina Yes -  learn activity limitations      Intervention Develop an individualized exercise prescription for aerobic and resistive training based on initial evaluation findings, risk stratification, comorbidities and participant's personal goals.;Provide advice, education, support and counseling about physical activity/exercise needs.  -      Expected Outcomes Achievement of increased cardiorespiratory fitness and enhanced flexibility, muscular endurance and strength shown through measurements of functional capacity and personal statement of participant.  -         Exercise Goals Re-Evaluation :    Discharge Exercise Prescription (Final Exercise Prescription Changes):   Nutrition:  Target Goals: Understanding of nutrition guidelines, daily intake of sodium 1500mg , cholesterol 200mg , calories 30% from fat and 7% or less from saturated fats, daily to have 5 or more  servings of fruits and vegetables.  Biometrics:     Pre Biometrics - 10/04/16 1557      Pre Biometrics   Waist Circumference 38.5 inches   Hip Circumference 40.5 inches   Waist to Hip Ratio 0.95 %   Triceps Skinfold 19 mm   % Body Fat 28.1 %   Grip Strength 40.5 kg   Flexibility 8 in   Single Leg Stand 20 seconds       Nutrition Therapy Plan and Nutrition Goals:   Nutrition Discharge: Nutrition Scores:   Nutrition Goals Re-Evaluation:   Nutrition Goals Re-Evaluation:   Nutrition Goals Discharge (Final Nutrition Goals Re-Evaluation):   Psychosocial: Target Goals: Acknowledge presence or absence of significant depression and/or stress, maximize coping skills, provide positive support system. Participant is able to verbalize types and ability to use techniques and skills needed for reducing stress and depression.  Initial Review & Psychosocial Screening:     Initial Psych Review & Screening - 10/04/16 1640      Initial Review   Current issues with None Identified     Family Dynamics   Good Support System? Yes  friends, family   Comments upon brief assessment, no psychosocial needs identified, no interventions necessary      Barriers   Psychosocial barriers to participate in program There are no identifiable barriers or psychosocial needs.     Screening Interventions   Interventions Encouraged to exercise;Provide feedback about the scores to participant      Quality of Life Scores:     Quality of Life - 10/04/16 1357      Quality of Life Scores   Health/Function Pre 29.9 %   Socioeconomic Pre 22.3 %   Psych/Spiritual Pre 23.14 %   Family Pre 16 %   GLOBAL Pre 21.4 %      PHQ-9: Recent Review Flowsheet Data    Depression screen [facility] 2/9 09/09/2016 11/02/2015 06/15/2015 02/09/2015 06/02/2014   Decreased Interest 0 0 0 0 0   Down, Depressed, Hopeless 0 0  0 0 0   PHQ - 2 Score 0 0 0 0 0     Interpretation of Total Score  Total Score Depression  Severity:  1-4 = Minimal depression, 5-9 = Mild depression, 10-14 = Moderate depression, 15-19 = Moderately severe depression, 20-27 = Severe depression   Psychosocial Evaluation and Intervention:   Psychosocial Re-Evaluation:   Psychosocial Discharge (Final Psychosocial Re-Evaluation):   Vocational Rehabilitation: Provide vocational rehab assistance to qualifying candidates.   Vocational Rehab Evaluation & Intervention:     Vocational Rehab - 10/04/16 1639      Initial Vocational Rehab Evaluation & Intervention   Assessment shows need for Vocational Rehabilitation Yes      Education: Education Goals: Education classes will be provided on a weekly basis, covering required topics. Participant will state understanding/return demonstration of topics presented.  Learning Barriers/Preferences:     Learning Barriers/Preferences - 10/04/16 1345      Learning Barriers/Preferences   Learning Barriers Sight   Learning Preferences Written Material;Verbal Instruction;Skilled Demonstration      Education Topics: Count Your Pulse:  -Group instruction provided by verbal instruction, demonstration, patient participation and written materials to support subject.  Instructors address importance of being able to find your pulse and how to count your pulse when at home without a heart monitor.  Patients get hands on experience counting their pulse with staff help and individually.   Heart Attack, Angina, and Risk Factor Modification:  -Group instruction provided by verbal instruction, video, and written materials to support subject.  Instructors address signs and symptoms of angina and heart attacks.    Also discuss risk factors for heart disease and how to make changes to improve heart health risk factors.   Functional Fitness:  -Group instruction provided by verbal instruction, demonstration, patient participation, and written materials to support subject.  Instructors address safety  measures for doing things around the house.  Discuss how to get up and down off the floor, how to pick things up properly, how to safely get out of a chair without assistance, and balance training.   Meditation and Mindfulness:  -Group instruction provided by verbal instruction, patient participation, and written materials to support subject.  Instructor addresses importance of mindfulness and meditation practice to help reduce stress and improve awareness.  Instructor also leads participants through a meditation exercise.    Stretching for Flexibility and Mobility:  -Group instruction provided by verbal instruction, patient participation, and written materials to support subject.  Instructors lead participants through series of stretches that are designed to increase flexibility thus improving mobility.  These stretches are additional exercise for major muscle groups that are typically performed during regular warm up and cool down.   Hands Only CPR:  -Group verbal, video, and participation provides a basic overview of AHA guidelines for community CPR. Role-play of emergencies allow participants the opportunity to practice calling for help and chest compression technique with discussion of AED use.   Hypertension: -Group verbal and written instruction that provides a basic overview of hypertension including the most recent diagnostic guidelines, risk factor reduction with self-care instructions and medication management.    Nutrition I class: Heart Healthy Eating:  -Group instruction provided by PowerPoint slides, verbal discussion, and written materials to support subject matter. The instructor gives an explanation and review of the Therapeutic Lifestyle Changes diet recommendations, which includes a discussion on lipid goals, dietary fat, sodium, fiber, plant stanol/sterol esters, sugar, and the components of a well-balanced, healthy diet.   Nutrition II class:  Lifestyle Skills:  -Group  instruction provided by PowerPoint slides, verbal discussion, and written materials to support subject matter. The instructor gives an explanation and review of label reading, grocery shopping for heart health, heart healthy recipe modifications, and ways to make healthier choices when eating out.   Diabetes Question & Answer:  -Group instruction provided by PowerPoint slides, verbal discussion, and written materials to support subject matter. The instructor gives an explanation and review of diabetes co-morbidities, pre- and post-prandial blood glucose goals, pre-exercise blood glucose goals, signs, symptoms, and treatment of hypoglycemia and hyperglycemia, and foot care basics.   Diabetes Blitz:  -Group instruction provided by PowerPoint slides, verbal discussion, and written materials to support subject matter. The instructor gives an explanation and review of the physiology behind type 1 and type 2 diabetes, diabetes medications and rational behind using different medications, pre- and post-prandial blood glucose recommendations and Hemoglobin A1c goals, diabetes diet, and exercise including blood glucose guidelines for exercising safely.    Portion Distortion:  -Group instruction provided by PowerPoint slides, verbal discussion, written materials, and food models to support subject matter. The instructor gives an explanation of serving size versus portion size, changes in portions sizes over the last 20 years, and what consists of a serving from each food group.   Stress Management:  -Group instruction provided by verbal instruction, video, and written materials to support subject matter.  Instructors review role of stress in heart disease and how to cope with stress positively.     Exercising on Your Own:  -Group instruction provided by verbal instruction, power point, and written materials to support subject.  Instructors discuss benefits of exercise, components of exercise, frequency and  intensity of exercise, and end points for exercise.  Also discuss use of nitroglycerin and activating EMS.  Review options of places to exercise outside of rehab.  Review guidelines for sex with heart disease.   Cardiac Drugs I:  -Group instruction provided by verbal instruction and written materials to support subject.  Instructor reviews cardiac drug classes: antiplatelets, anticoagulants, beta blockers, and statins.  Instructor discusses reasons, side effects, and lifestyle considerations for each drug class.   Cardiac Drugs II:  -Group instruction provided by verbal instruction and written materials to support subject.  Instructor reviews cardiac drug classes: angiotensin converting enzyme inhibitors (ACE-I), angiotensin II receptor blockers (ARBs), nitrates, and calcium channel blockers.  Instructor discusses reasons, side effects, and lifestyle considerations for each drug class.   Anatomy and Physiology of the Circulatory System:  Group verbal and written instruction and models provide basic cardiac anatomy and physiology, with the coronary electrical and arterial systems. Review of: AMI, Angina, Valve disease, Heart Failure, Peripheral Artery Disease, Cardiac Arrhythmia, Pacemakers, and the ICD.   Other Education:  -Group or individual verbal, written, or video instructions that support the educational goals of the cardiac rehab program.   Knowledge Questionnaire Score:     Knowledge Questionnaire Score - 10/04/16 0945      Knowledge Questionnaire Score   Pre Score 19/24      Core Components/Risk Factors/Patient Goals at Admission:     Personal Goals and Risk Factors at Admission - 10/04/16 1558      Core Components/Risk Factors/Patient Goals on Admission   Hypertension Yes   Intervention Monitor prescription use compliance.;Provide education on lifestyle modifcations including regular physical activity/exercise, weight management, moderate sodium restriction and increased  consumption of fresh fruit, vegetables, and low fat dairy, alcohol moderation, and smoking cessation.   Expected Outcomes Short  Term: Continued assessment and intervention until BP is < 140/23mm HG in hypertensive participants. < 130/40mm HG in hypertensive participants with diabetes, heart failure or chronic kidney disease.;Long Term: Maintenance of blood pressure at goal levels.   Lipids Yes   Intervention Provide education and support for participant on nutrition & aerobic/resistive exercise along with prescribed medications to achieve LDL 70mg , HDL >40mg .   Expected Outcomes Short Term: Participant states understanding of desired cholesterol values and is compliant with medications prescribed. Participant is following exercise prescription and nutrition guidelines.;Long Term: Cholesterol controlled with medications as prescribed, with individualized exercise RX and with personalized nutrition plan. Value goals: LDL < 70mg , HDL > 40 mg.      Core Components/Risk Factors/Patient Goals Review:    Core Components/Risk Factors/Patient Goals at Discharge (Final Review):    ITP Comments:     ITP Comments    Row Name 10/04/16 1342           ITP Comments Medical Director, Dr. Armanda Magic          Comments: Patient attended orientation from 1335 to 1450  to review rules and guidelines for program. Completed 6 minute walk test, Intitial ITP, and exercise prescription.  VSS. Telemetry-sinus rhythm.  Asymptomatic.

## 2016-10-06 ENCOUNTER — Other Ambulatory Visit: Payer: Self-pay | Admitting: Infectious Disease

## 2016-10-06 DIAGNOSIS — I10 Essential (primary) hypertension: Secondary | ICD-10-CM

## 2016-10-12 ENCOUNTER — Encounter (HOSPITAL_COMMUNITY): Payer: Self-pay

## 2016-10-12 ENCOUNTER — Encounter (HOSPITAL_COMMUNITY)
Admission: RE | Admit: 2016-10-12 | Discharge: 2016-10-12 | Disposition: A | Discharge status: Home / Self Care | Payer: Medicare Other | Source: Ambulatory Visit | Attending: Cardiovascular Disease | Admitting: Cardiovascular Disease

## 2016-10-12 DIAGNOSIS — E785 Hyperlipidemia, unspecified: Secondary | ICD-10-CM | POA: Insufficient documentation

## 2016-10-12 DIAGNOSIS — Z955 Presence of coronary angioplasty implant and graft: Secondary | ICD-10-CM | POA: Diagnosis not present

## 2016-10-12 DIAGNOSIS — I213 ST elevation (STEMI) myocardial infarction of unspecified site: Secondary | ICD-10-CM | POA: Diagnosis not present

## 2016-10-12 DIAGNOSIS — I10 Essential (primary) hypertension: Secondary | ICD-10-CM | POA: Insufficient documentation

## 2016-10-12 DIAGNOSIS — K219 Gastro-esophageal reflux disease without esophagitis: Secondary | ICD-10-CM | POA: Insufficient documentation

## 2016-10-12 DIAGNOSIS — Z87891 Personal history of nicotine dependence: Secondary | ICD-10-CM | POA: Insufficient documentation

## 2016-10-12 DIAGNOSIS — I214 Non-ST elevation (NSTEMI) myocardial infarction: Secondary | ICD-10-CM

## 2016-10-12 NOTE — Progress Notes (Signed)
Daily Session Note  Patient Details  Name: Wayne Santos MRN: [id number] Date of Birth: 10/04/1950 Referring Provider:     Reader from 10/04/2016 in [facility]  Referring Provider  Shelva Majestic MD      Encounter Date: 10/12/2016  Check In:     Session Check In - 10/12/16 0847      Check-In   Location [facility]   Staff Present Cleda Mccreedy, MS, Exercise Physiologist;Carlette Wilber Oliphant, RN, BSN;Amber Fair, MS, ACSM RCEP, Exercise Physiologist;Joann Rion, RN, Marga Melnick, RN, BSN   Supervising physician immediately available to respond to emergencies [facility] immediately available   Physician(s) Dr. Posey Pronto   Medication changes reported     No   Fall or balance concerns reported    No   Tobacco Cessation No Change   Warm-up and Cool-down Performed as group-led instruction   VAD Patient? No     Pain Assessment   Currently in Pain? No/denies   Multiple Pain Sites No      Capillary Blood Glucose: No results found for this or any previous visit (from the past 24 hour(s)).    History  Smoking Status  . Former Smoker  . Packs/day: 0.50  . Years: 47.00  . Types: Cigarettes  Smokeless Tobacco  . Never Used    Goals Met:  Exercise tolerated well  Goals Unmet:  Not Applicable  Comments: Pt started cardiac rehab today.  Pt tolerated light exercise without difficulty. VSS, telemetry-sinus rhythm,  asymptomatic.  Medication list reconciled. Pt denies barriers to medicaiton compliance.  PSYCHOSOCIAL ASSESSMENT:  PHQ-0. Pt exhibits positive coping skills, hopeful outlook with supportive family. No psychosocial needs identified at this time, no psychosocial interventions necessary.    Pt enjoys travel, dance and singing.  Pt goals for cardiac rehab are to have decreased health related fear and anxiety.  Pt states "I want to feel normal again."  In addition, pt is looking to feel stable  and resume usual activities. Pt encouraged to interact with class peers, and educational opportunities to help achieve these goals.     Pt oriented to exercise equipment and routine.    Understanding verbalized.   Dr. Fransico Him is Medical Director for Cardiac Rehab at [facility].

## 2016-10-14 ENCOUNTER — Encounter (HOSPITAL_COMMUNITY)
Admission: RE | Admit: 2016-10-14 | Discharge: 2016-10-14 | Disposition: A | Discharge status: Home / Self Care | Payer: Medicare Other | Source: Ambulatory Visit | Attending: Cardiovascular Disease | Admitting: Cardiovascular Disease

## 2016-10-14 DIAGNOSIS — I214 Non-ST elevation (NSTEMI) myocardial infarction: Secondary | ICD-10-CM

## 2016-10-14 DIAGNOSIS — Z955 Presence of coronary angioplasty implant and graft: Secondary | ICD-10-CM

## 2016-10-17 ENCOUNTER — Encounter (HOSPITAL_COMMUNITY): Payer: Medicare Other

## 2016-10-19 ENCOUNTER — Encounter (HOSPITAL_COMMUNITY)
Admission: RE | Admit: 2016-10-19 | Discharge: 2016-10-19 | Disposition: A | Discharge status: Home / Self Care | Payer: Medicare Other | Source: Ambulatory Visit | Attending: Cardiovascular Disease | Admitting: Cardiovascular Disease

## 2016-10-19 DIAGNOSIS — Z955 Presence of coronary angioplasty implant and graft: Secondary | ICD-10-CM | POA: Diagnosis not present

## 2016-10-19 DIAGNOSIS — I214 Non-ST elevation (NSTEMI) myocardial infarction: Secondary | ICD-10-CM

## 2016-10-21 ENCOUNTER — Encounter (HOSPITAL_COMMUNITY)
Admission: RE | Admit: 2016-10-21 | Discharge: 2016-10-21 | Disposition: A | Discharge status: Home / Self Care | Payer: Medicare Other | Source: Ambulatory Visit | Attending: Cardiovascular Disease | Admitting: Cardiovascular Disease

## 2016-10-21 DIAGNOSIS — Z955 Presence of coronary angioplasty implant and graft: Secondary | ICD-10-CM

## 2016-10-21 DIAGNOSIS — I214 Non-ST elevation (NSTEMI) myocardial infarction: Secondary | ICD-10-CM

## 2016-10-24 ENCOUNTER — Encounter (HOSPITAL_COMMUNITY): Payer: Medicare Other

## 2016-10-25 NOTE — Progress Notes (Signed)
Cardiac Individual Treatment Plan  Patient Details  Name: Wayne Santos MRN: [id number] Date of Birth: May 03, 1951 Referring Provider:     CARDIAC REHAB PHASE II [facility]ATION [facility]  Referring Provider  Nicki GuadalajaraKelly, Thomas MD      Initial Encounter Date:    CARDIAC REHAB PHASE [facility]  Date  10/04/16  Referring Provider  Nicki GuadalajaraKelly, Thomas MD      Visit Diagnosis: 08/23/16 NSTEMI (non-ST elevated myocardial infarction) (HCC)  08/24/16 Status post coronary artery stent placement  Patient's Home Medications on Admission:  Current Outpatient Prescriptions:  .  amLODipine (NORVASC) 5 MG tablet, TAKE 1 TABLET BY MOUTH EVERY DAY, Disp: 30 tablet, Rfl: 3 .  aspirin EC 81 MG EC tablet, Take 1 tablet (81 mg total) by mouth daily., Disp: , Rfl:  .  cetirizine (ZYRTEC) 10 MG tablet, Take 1 tablet (10 mg total) by mouth at bedtime., Disp: 30 tablet, Rfl: 11 .  clopidogrel (PLAVIX) 75 MG tablet, Take 1 tablet (75 mg total) by mouth daily., Disp: 30 tablet, Rfl: 11 .  DESCOVY 200-25 MG tablet, TAKE 1 TABLET BY MOUTH ONCE DAILY, Disp: 30 tablet, Rfl: 5 .  methocarbamol (ROBAXIN) 500 MG tablet, Take 1 tablet by mouth as needed., Disp: , Rfl: 0 .  metoprolol tartrate (LOPRESSOR) 25 MG tablet, Take 0.5 tablets (12.5 mg total) by mouth 2 (two) times daily., Disp: 60 tablet, Rfl: 3 .  mometasone (NASONEX) 50 MCG/ACT nasal spray, Place 2 sprays into the nose daily. (Patient taking differently: Place 2 sprays into the nose daily as needed (allergies). ), Disp: 17 g, Rfl: 12 .  Multiple Vitamin (MULTIVITAMIN) tablet, Take 1 tablet by mouth daily., Disp: , Rfl:  .  nitroGLYCERIN (NITROSTAT) 0.4 MG SL tablet, Place 1 tablet (0.4 mg total) under the tongue every 5 (five) minutes x 3 doses as needed for chest pain., Disp: 25 tablet, Rfl: 3 .  oxyCODONE-acetaminophen (PERCOCET/ROXICET) 5-325 MG tablet,  Take 1 tablet by mouth daily as needed., Disp: , Rfl:  .  pravastatin (PRAVACHOL) 40 MG tablet, TAKE 1 TABLET BY MOUTH DAILY, Disp: 30 tablet, Rfl: 5 .  PREZCOBIX 800-150 MG tablet, TAKE 1 TABLET BY MOUTH DAILY, Disp: 30 tablet, Rfl: 5 .  Vitamin D, Ergocalciferol, (DRISDOL) 50000 UNITS CAPS capsule, Take 1 capsule (50,000 Units total) by mouth every 7 (seven) days., Disp: 8 capsule, Rfl: 0  Past Medical History: Past Medical History:  Diagnosis Date  . Anemia   . AVN (avascular necrosis of bone) (HCC)   . Benign paroxysmal positional vertigo 06/15/2015  . CAD (coronary artery disease), native coronary artery    a. 08/24/16 NSTEMI/PCI: LM nl, LAD 40p, D1 60ost, D2 80ost-small, LCX 1721m (2.5x30 ONyx DES), OM1/2 min irregs, OM3 nl, RCA 30p/m, RPDA/RPAV/RPL1/RPL2/RPL3 nl, EF 55-65%.  . Childhood asthma   . GERD (gastroesophageal reflux disease)   . Glaucoma   . History of pneumonia    "4 times in my life" (08/24/2016)  . HIV infection (HCC) dx'd in the 1990s   "I've been undetectable 19 times" (08/24/2016)  . Hyperlipidemia   . Hypertension   . Memory impairment 06/15/2015  . Osteoporosis   . Primary localized osteoarthrosis of left hip 07/05/2016  . Seasonal allergies   . Tobacco abuse     Tobacco Use: History  Smoking Status  . Former Smoker  . Packs/day: 0.50  . Years: 47.00  . Types:  Cigarettes  Smokeless Tobacco  . Never Used    Labs: Recent Review Flowsheet Data    Labs for ITP Cardiac and Pulmonary Rehab Latest Ref Rng & Units 02/04/2013 12/18/2013 04/30/2014 01/26/2015 08/24/2016   Cholestrol 0 - 200 mg/dL - 161159 096142 045167 [id number]   LDLCALC 0 - 99 mg/dL - 79 87 811113 79   HDL >91>40 mg/dL - 47(W32(L) 29(F30(L) 62(Z32(L) 30(Q30(L)   Trlycerides <150 mg/dL - 657(Q238(H) 469127 629112 [id number]   Hemoglobin A1c 4.8 - 5.6 % 6.2(H) 6.3(H) - - 6.1(H)      Capillary Blood Glucose: No results found for: GLUCAP   Exercise Target Goals:    Exercise Program Goal: Individual exercise prescription set with THRR, safety &  activity barriers. Participant demonstrates ability to understand and report RPE using BORG scale, to self-measure pulse accurately, and to acknowledge the importance of the exercise prescription.  Exercise Prescription Goal: Starting with aerobic activity 30 plus minutes a day, 3 days per week for initial exercise prescription. Provide home exercise prescription and guidelines that participant acknowledges understanding prior to discharge.  Activity Barriers & Risk Stratification:     Activity Barriers & Cardiac Risk Stratification - 10/04/16 1345      Activity Barriers & Cardiac Risk Stratification   Activity Barriers Deconditioning;Muscular Weakness;Shortness of Breath   Cardiac Risk Stratification High      6 Minute Walk:     6 Minute Walk    Row Name 10/04/16 1552         6 Minute Walk   Phase Initial     Distance 1283 feet     Walk Time 6 minutes     # of Rest Breaks 0     MPH 2.43     METS 2.9     RPE 9     VO2 Peak 10.17     Symptoms No     Resting HR 56 bpm     Resting BP 108/60     Max Ex. HR 81 bpm     Max Ex. BP 132/70     2 Minute Post BP 124/72        Oxygen Initial Assessment:   Oxygen Re-Evaluation:   Oxygen Discharge (Final Oxygen Re-Evaluation):   Initial Exercise Prescription:     Initial Exercise Prescription - 10/04/16 1500      Date of Initial Exercise RX and Referring Provider   Date 10/04/16   Referring Provider Nicki GuadalajaraKelly, Thomas MD     Treadmill   MPH 2.5   Grade 0   Minutes 10   METs 2.91     Bike   Level 0.7   Minutes 10   METs 2.55     NuStep   Level 3   SPM 80   Minutes 10   METs 2.3     Prescription Details   Frequency (times per week) 3   Duration Progress to 30 minutes of continuous aerobic without signs/symptoms of physical distress     Intensity   THRR 40-80% of Max Heartrate 62-124   Ratings of Perceived Exertion 11-13   Perceived Dyspnea 0-4     Progression   Progression Continue to progress  workloads to maintain intensity without signs/symptoms of physical distress.     Resistance Training   Training Prescription Yes   Weight 3lbs   Reps 10-15      Perform Capillary Blood Glucose checks as needed.  Exercise Prescription Changes:     Exercise Prescription Changes  Row Name 10/13/16 1200             Response to Exercise   Blood Pressure (Admit) 122/60       Blood Pressure (Exercise) 140/80       Blood Pressure (Exit) 108/60       Heart Rate (Admit) 66 bpm       Heart Rate (Exercise) 96 bpm       Heart Rate (Exit) 66 bpm       Rating of Perceived Exertion (Exercise) 12       Symptoms none       Comments Pt oriented to exercise equipment on 10/12/16       Duration Continue with 30 min of aerobic exercise without signs/symptoms of physical distress.       Intensity THRR unchanged         Progression   Progression Continue to progress workloads to maintain intensity without signs/symptoms of physical distress.       Average METs 2.3         Resistance Training   Training Prescription Yes       Weight 3lbs       Reps 10-15       Time 10 Minutes         Bike   Level 0.7       Minutes 10       METs 2.55         NuStep   Level 3       SPM 80       Minutes 10       METs 2.2         Track   Laps 8       Minutes 10       METs 2.39          Exercise Comments:     Exercise Comments    Row Name 10/24/16 1102           Exercise Comments Pt is tolerating exercise well; will continue to monitor activity levels and exercise progression.          Exercise Goals and Review:     Exercise Goals    Row Name 10/04/16 1346 10/04/16 1353           Exercise Goals   Increase Physical Activity Yes  -      Intervention Provide advice, education, support and counseling about physical activity/exercise needs.;Develop an individualized exercise prescription for aerobic and resistive training based on initial evaluation findings, risk stratification,  comorbidities and participant's personal goals.  -      Expected Outcomes Achievement of increased cardiorespiratory fitness and enhanced flexibility, muscular endurance and strength shown through measurements of functional capacity and personal statement of participant.  -      Increase Strength and Stamina Yes -  learn activity limitations      Intervention Develop an individualized exercise prescription for aerobic and resistive training based on initial evaluation findings, risk stratification, comorbidities and participant's personal goals.;Provide advice, education, support and counseling about physical activity/exercise needs.  -      Expected Outcomes Achievement of increased cardiorespiratory fitness and enhanced flexibility, muscular endurance and strength shown through measurements of functional capacity and personal statement of participant.  -         Exercise Goals Re-Evaluation :     Exercise Goals Re-Evaluation    Row Name 10/24/16 1102  Exercise Goal Re-Evaluation   Exercise Goals Review Increase Physical Activity;Increase Strenth and Stamina       Comments Pt is exercising for 30 minutes and actively participates in cardiac rehab       Expected Outcomes Pt will continue to build on aerobic capacity           Discharge Exercise Prescription (Final Exercise Prescription Changes):     Exercise Prescription Changes - 10/13/16 1200      Response to Exercise   Blood Pressure (Admit) 122/60   Blood Pressure (Exercise) 140/80   Blood Pressure (Exit) 108/60   Heart Rate (Admit) 66 bpm   Heart Rate (Exercise) 96 bpm   Heart Rate (Exit) 66 bpm   Rating of Perceived Exertion (Exercise) 12   Symptoms none   Comments Pt oriented to exercise equipment on 10/12/16   Duration Continue with 30 min of aerobic exercise without signs/symptoms of physical distress.   Intensity THRR unchanged     Progression   Progression Continue to progress workloads to maintain  intensity without signs/symptoms of physical distress.   Average METs 2.3     Resistance Training   Training Prescription Yes   Weight 3lbs   Reps 10-15   Time 10 Minutes     Bike   Level 0.7   Minutes 10   METs 2.55     NuStep   Level 3   SPM 80   Minutes 10   METs 2.2     Track   Laps 8   Minutes 10   METs 2.39      Nutrition:  Target Goals: Understanding of nutrition guidelines, daily intake of sodium 1500mg , cholesterol 200mg , calories 30% from fat and 7% or less from saturated fats, daily to have 5 or more servings of fruits and vegetables.  Biometrics:     Pre Biometrics - 10/04/16 1557      Pre Biometrics   Waist Circumference 38.5 inches   Hip Circumference 40.5 inches   Waist to Hip Ratio 0.95 %   Triceps Skinfold 19 mm   % Body Fat 28.1 %   Grip Strength 40.5 kg   Flexibility 8 in   Single Leg Stand 20 seconds       Nutrition Therapy Plan and Nutrition Goals:   Nutrition Discharge: Nutrition Scores:   Nutrition Goals Re-Evaluation:   Nutrition Goals Re-Evaluation:   Nutrition Goals Discharge (Final Nutrition Goals Re-Evaluation):   Psychosocial: Target Goals: Acknowledge presence or absence of significant depression and/or stress, maximize coping skills, provide positive support system. Participant is able to verbalize types and ability to use techniques and skills needed for reducing stress and depression.  Initial Review & Psychosocial Screening:     Initial Psych Review & Screening - 10/04/16 1640      Initial Review   Current issues with None Identified     Family Dynamics   Good Support System? Yes  friends, family   Comments upon brief assessment, no psychosocial needs identified, no interventions necessary      Barriers   Psychosocial barriers to participate in program There are no identifiable barriers or psychosocial needs.     Screening Interventions   Interventions Encouraged to exercise;Provide feedback about  the scores to participant      Quality of Life Scores:     Quality of Life - 10/19/16 1109      Quality of Life Scores   Health/Function Pre 29.9 %   Socioeconomic Pre 22.3 %  Psych/Spiritual Pre 23.14 %   Family Pre 16 %  pt experience situational stress with his siblings and extended family. pt is learning to set healthy boundaries to avoid being taken advantage of and decrease stress/ anxiety.  this ia new behavior for handling situations that arise.    GLOBAL Pre 21.4 %  pt offered emotional support and reassurance. pt encouraged to continue establishing healthy boundaries to increase improve coping skills and reduce stress/anixiety.        PHQ-9: Recent Review Flowsheet Data    Depression screen [facility] 2/9 10/12/2016 09/09/2016 11/02/2015 06/15/2015 02/09/2015   Decreased Interest 0 0 0 0 0   Down, Depressed, Hopeless 0 0 0 0 0   PHQ - 2 Score 0 0 0 0 0     Interpretation of Total Score  Total Score Depression Severity:  1-4 = Minimal depression, 5-9 = Mild depression, 10-14 = Moderate depression, 15-19 = Moderately severe depression, 20-27 = Severe depression   Psychosocial Evaluation and Intervention:     Psychosocial Evaluation - 10/12/16 1015      Psychosocial Evaluation & Interventions   Interventions Encouraged to exercise with the program and follow exercise prescription   Comments no psychosocial needs identified, no interventions necessary.    Expected Outcomes pt will exhibit positive outlook with good coping skills.    Continue Psychosocial Services  No Follow up required      Psychosocial Re-Evaluation:     Psychosocial Re-Evaluation    Row Name 10/25/16 1539             Psychosocial Re-Evaluation   Current issues with None Identified       Comments no psychosocial needs identified, no interventions necessary        Expected Outcomes pt will exhibit positive outlook with good coping skills.        Interventions Encouraged to attend Cardiac  Rehabilitation for the exercise;Stress management education;Relaxation education       Continue Psychosocial Services  No Follow up required          Psychosocial Discharge (Final Psychosocial Re-Evaluation):     Psychosocial Re-Evaluation - 10/25/16 1539      Psychosocial Re-Evaluation   Current issues with None Identified   Comments no psychosocial needs identified, no interventions necessary    Expected Outcomes pt will exhibit positive outlook with good coping skills.    Interventions Encouraged to attend Cardiac Rehabilitation for the exercise;Stress management education;Relaxation education   Continue Psychosocial Services  No Follow up required      Vocational Rehabilitation: Provide vocational rehab assistance to qualifying candidates.   Vocational Rehab Evaluation & Intervention:     Vocational Rehab - 10/19/16 1109      Initial Vocational Rehab Evaluation & Intervention   Assessment shows need for Vocational Rehabilitation Yes   Vocational Rehab Packet given to patient 10/19/16      Education: Education Goals: Education classes will be provided on a weekly basis, covering required topics. Participant will state understanding/return demonstration of topics presented.  Learning Barriers/Preferences:     Learning Barriers/Preferences - 10/04/16 1345      Learning Barriers/Preferences   Learning Barriers Sight   Learning Preferences Written Material;Verbal Instruction;Skilled Demonstration      Education Topics: Count Your Pulse:  -Group instruction provided by verbal instruction, demonstration, patient participation and written materials to support subject.  Instructors address importance of being able to find your pulse and how to count your pulse when at home without a heart monitor.  Patients get hands on experience counting their pulse with staff help and individually.   Heart Attack, Angina, and Risk Factor Modification:  -Group instruction provided by  verbal instruction, video, and written materials to support subject.  Instructors address signs and symptoms of angina and heart attacks.    Also discuss risk factors for heart disease and how to make changes to improve heart health risk factors.   Functional Fitness:  -Group instruction provided by verbal instruction, demonstration, patient participation, and written materials to support subject.  Instructors address safety measures for doing things around the house.  Discuss how to get up and down off the floor, how to pick things up properly, how to safely get out of a chair without assistance, and balance training.   CARDIAC REHAB PHASE II EXERCISE from 10/21/2016 in [facility] CARDIAC REHAB  Date  10/21/16  Instruction Review Code  2- meets goals/outcomes      Meditation and Mindfulness:  -Group instruction provided by verbal instruction, patient participation, and written materials to support subject.  Instructor addresses importance of mindfulness and meditation practice to help reduce stress and improve awareness.  Instructor also leads participants through a meditation exercise.    CARDIAC REHAB PHASE II EXERCISE from 10/21/2016 in [facility] CARDIAC REHAB  Date  10/12/16  Instruction Review Code  2- meets goals/outcomes      Stretching for Flexibility and Mobility:  -Group instruction provided by verbal instruction, patient participation, and written materials to support subject.  Instructors lead participants through series of stretches that are designed to increase flexibility thus improving mobility.  These stretches are additional exercise for major muscle groups that are typically performed during regular warm up and cool down.   Hands Only CPR:  -Group verbal, video, and participation provides a basic overview of AHA guidelines for community CPR. Role-play of emergencies allow participants the opportunity to practice calling for help and chest  compression technique with discussion of AED use.   Hypertension: -Group verbal and written instruction that provides a basic overview of hypertension including the most recent diagnostic guidelines, risk factor reduction with self-care instructions and medication management.   CARDIAC REHAB PHASE II EXERCISE from 10/21/2016 in [facility] CARDIAC REHAB  Date  10/14/16  Instruction Review Code  2- meets goals/outcomes       Nutrition I class: Heart Healthy Eating:  -Group instruction provided by PowerPoint slides, verbal discussion, and written materials to support subject matter. The instructor gives an explanation and review of the Therapeutic Lifestyle Changes diet recommendations, which includes a discussion on lipid goals, dietary fat, sodium, fiber, plant stanol/sterol esters, sugar, and the components of a well-balanced, healthy diet.   Nutrition II class: Lifestyle Skills:  -Group instruction provided by PowerPoint slides, verbal discussion, and written materials to support subject matter. The instructor gives an explanation and review of label reading, grocery shopping for heart health, heart healthy recipe modifications, and ways to make healthier choices when eating out.   Diabetes Question & Answer:  -Group instruction provided by PowerPoint slides, verbal discussion, and written materials to support subject matter. The instructor gives an explanation and review of diabetes co-morbidities, pre- and post-prandial blood glucose goals, pre-exercise blood glucose goals, signs, symptoms, and treatment of hypoglycemia and hyperglycemia, and foot care basics.   Diabetes Blitz:  -Group instruction provided by PowerPoint slides, verbal discussion, and written materials to support subject matter. The instructor gives an explanation and review of the physiology behind  type 1 and type 2 diabetes, diabetes medications and rational behind using different medications, pre- and  post-prandial blood glucose recommendations and Hemoglobin A1c goals, diabetes diet, and exercise including blood glucose guidelines for exercising safely.    Portion Distortion:  -Group instruction provided by PowerPoint slides, verbal discussion, written materials, and food models to support subject matter. The instructor gives an explanation of serving size versus portion size, changes in portions sizes over the last 20 years, and what consists of a serving from each food group.   Stress Management:  -Group instruction provided by verbal instruction, video, and written materials to support subject matter.  Instructors review role of stress in heart disease and how to cope with stress positively.     Exercising on Your Own:  -Group instruction provided by verbal instruction, power point, and written materials to support subject.  Instructors discuss benefits of exercise, components of exercise, frequency and intensity of exercise, and end points for exercise.  Also discuss use of nitroglycerin and activating EMS.  Review options of places to exercise outside of rehab.  Review guidelines for sex with heart disease.   Cardiac Drugs I:  -Group instruction provided by verbal instruction and written materials to support subject.  Instructor reviews cardiac drug classes: antiplatelets, anticoagulants, beta blockers, and statins.  Instructor discusses reasons, side effects, and lifestyle considerations for each drug class.   Cardiac Drugs II:  -Group instruction provided by verbal instruction and written materials to support subject.  Instructor reviews cardiac drug classes: angiotensin converting enzyme inhibitors (ACE-I), angiotensin II receptor blockers (ARBs), nitrates, and calcium channel blockers.  Instructor discusses reasons, side effects, and lifestyle considerations for each drug class.   CARDIAC REHAB PHASE II EXERCISE from 10/21/2016 in [facility]ARD[facility]  Educator   [place]  Instruction Review Code  2- meets goals/outcomes      Anatomy and Physiology of the Circulatory System:  Group verbal and written instruction and models provide basic cardiac anatomy and physiology, with the coronary electrical and arterial systems. Review of: AMI, Angina, Valve disease, Heart Failure, Peripheral Artery Disease, Cardiac Arrhythmia, Pacemakers, and the ICD.   Other Education:  -Group or individual verbal, written, or video instructions that support the educational goals of the cardiac rehab program.   Knowledge Questionnaire Score:     Knowledge Questionnaire Score - 10/04/16 0945      Knowledge Questionnaire Score   Pre Score 19/24      Core Components/Risk Factors/Patient Goals at Admission:     Personal Goals and Risk Factors at Admission - 10/04/16 1558      Core Components/Risk Factors/Patient Goals on Admission   Hypertension Yes   Intervention Monitor prescription use compliance.;Provide education on lifestyle modifcations including regular physical activity/exercise, weight management, moderate sodium restriction and increased consumption of fresh fruit, vegetables, and low fat dairy, alcohol moderation, and smoking cessation.   Expected Outcomes Short Term: Continued assessment and intervention until BP is < 140/18mm HG in hypertensive participants. < 130/54mm HG in hypertensive participants with diabetes, heart failure or chronic kidney disease.;Long Term: Maintenance of blood pressure at goal levels.   Lipids Yes   Intervention Provide education and support for participant on nutrition & aerobic/resistive exercise along with prescribed medications to achieve LDL 70mg , HDL >40mg .   Expected Outcomes Short Term: Participant states understanding of desired cholesterol values and is compliant with medications prescribed. Participant is following exercise prescription and nutrition guidelines.;Long Term: Cholesterol controlled with medications as  prescribed, with individualized  exercise RX and with personalized nutrition plan. Value goals: LDL < 70mg , HDL > 40 mg.      Core Components/Risk Factors/Patient Goals Review:      Goals and Risk Factor Review    Row Name 10/25/16 1537             Core Components/Risk Factors/Patient Goals Review   Personal Goals Review Hypertension;Lipids;Other       Review pt with desire to reduce risk factors exhibits eagerness to participate in CR activities.        Expected Outcomes pt will participate in CR exercise, nutrition and lifestyle modification education to reduce overall risk factors.           Core Components/Risk Factors/Patient Goals at Discharge (Final Review):      Goals and Risk Factor Review - 10/25/16 1537      Core Components/Risk Factors/Patient Goals Review   Personal Goals Review Hypertension;Lipids;Other   Review pt with desire to reduce risk factors exhibits eagerness to participate in CR activities.    Expected Outcomes pt will participate in CR exercise, nutrition and lifestyle modification education to reduce overall risk factors.       ITP Comments:     ITP Comments    Row Name 10/04/16 1342 10/25/16 1536         ITP Comments Medical Director, Dr. Armanda Magic Medical Director, Dr. Armanda Magic         Comments: Pt is making expected progress toward personal goals after completing 5 sessions. Recommend continued exercise and life style modification education including  stress management and relaxation techniques to decrease cardiac risk profile.

## 2016-10-26 ENCOUNTER — Encounter (HOSPITAL_COMMUNITY)
Admission: RE | Admit: 2016-10-26 | Discharge: 2016-10-26 | Disposition: A | Discharge status: Home / Self Care | Payer: Medicare Other | Source: Ambulatory Visit | Attending: Cardiovascular Disease | Admitting: Cardiovascular Disease

## 2016-10-26 DIAGNOSIS — Z955 Presence of coronary angioplasty implant and graft: Secondary | ICD-10-CM | POA: Diagnosis not present

## 2016-10-26 DIAGNOSIS — I214 Non-ST elevation (NSTEMI) myocardial infarction: Secondary | ICD-10-CM

## 2016-10-28 ENCOUNTER — Encounter (HOSPITAL_COMMUNITY)
Admission: RE | Admit: 2016-10-28 | Discharge: 2016-10-28 | Disposition: A | Discharge status: Home / Self Care | Payer: Medicare Other | Source: Ambulatory Visit | Attending: Cardiovascular Disease | Admitting: Cardiovascular Disease

## 2016-10-28 DIAGNOSIS — Z955 Presence of coronary angioplasty implant and graft: Secondary | ICD-10-CM | POA: Diagnosis not present

## 2016-10-28 DIAGNOSIS — I214 Non-ST elevation (NSTEMI) myocardial infarction: Secondary | ICD-10-CM

## 2016-10-31 ENCOUNTER — Encounter (HOSPITAL_COMMUNITY): Payer: Medicare Other

## 2016-11-02 ENCOUNTER — Encounter (HOSPITAL_COMMUNITY): Admission: RE | Admit: 2016-11-02 | Payer: Medicare Other | Source: Ambulatory Visit

## 2016-11-04 ENCOUNTER — Encounter (HOSPITAL_COMMUNITY): Payer: Medicare Other

## 2016-11-07 ENCOUNTER — Other Ambulatory Visit: Payer: Self-pay | Admitting: Infectious Disease

## 2016-11-07 ENCOUNTER — Encounter (HOSPITAL_COMMUNITY): Payer: Medicare Other

## 2016-11-07 DIAGNOSIS — B20 Human immunodeficiency virus [HIV] disease: Secondary | ICD-10-CM

## 2016-11-08 ENCOUNTER — Ambulatory Visit: Payer: Medicare Other | Admitting: Infectious Disease

## 2016-11-08 ENCOUNTER — Encounter: Payer: Self-pay | Admitting: *Deleted

## 2016-11-11 ENCOUNTER — Ambulatory Visit: Payer: Medicare Other | Admitting: Infectious Disease

## 2016-11-11 ENCOUNTER — Encounter (HOSPITAL_COMMUNITY)
Admission: RE | Admit: 2016-11-11 | Discharge: 2016-11-11 | Disposition: A | Discharge status: Home / Self Care | Payer: Medicare Other | Source: Ambulatory Visit | Attending: Cardiovascular Disease | Admitting: Cardiovascular Disease

## 2016-11-11 DIAGNOSIS — K219 Gastro-esophageal reflux disease without esophagitis: Secondary | ICD-10-CM | POA: Diagnosis not present

## 2016-11-11 DIAGNOSIS — I213 ST elevation (STEMI) myocardial infarction of unspecified site: Secondary | ICD-10-CM | POA: Diagnosis not present

## 2016-11-11 DIAGNOSIS — Z87891 Personal history of nicotine dependence: Secondary | ICD-10-CM | POA: Insufficient documentation

## 2016-11-11 DIAGNOSIS — I10 Essential (primary) hypertension: Secondary | ICD-10-CM | POA: Insufficient documentation

## 2016-11-11 DIAGNOSIS — E785 Hyperlipidemia, unspecified: Secondary | ICD-10-CM | POA: Diagnosis not present

## 2016-11-11 DIAGNOSIS — Z955 Presence of coronary angioplasty implant and graft: Secondary | ICD-10-CM | POA: Diagnosis present

## 2016-11-11 NOTE — Progress Notes (Signed)
Reviewed home exercise with pt today.  [facility] walk and[facility]  Reviewed THR, pulse, RPE, sign and symptoms, NTG use, and when to call 911 or MD.  Also discussed weather considerations and indoor options.  Pt voiced understanding.   Clearence Vitug [employer],MS,ACSM RCEP

## 2016-11-14 ENCOUNTER — Encounter (HOSPITAL_COMMUNITY): Payer: Medicare Other

## 2016-11-16 ENCOUNTER — Encounter (HOSPITAL_COMMUNITY)
Admission: RE | Admit: 2016-11-16 | Discharge: 2016-11-16 | Disposition: A | Discharge status: Home / Self Care | Payer: Medicare Other | Source: Ambulatory Visit | Attending: Cardiovascular Disease | Admitting: Cardiovascular Disease

## 2016-11-16 DIAGNOSIS — Z955 Presence of coronary angioplasty implant and graft: Secondary | ICD-10-CM | POA: Diagnosis not present

## 2016-11-16 DIAGNOSIS — I214 Non-ST elevation (NSTEMI) myocardial infarction: Secondary | ICD-10-CM

## 2016-11-16 NOTE — Progress Notes (Signed)
Wayne DressJerry L Santos 66 y.o. male Nutrition Note Spoke with pt. Nutrition Plan and Nutrition Survey goals reviewed with pt. Pt is following Step 2 of the Therapeutic Lifestyle Changes diet. Pt wants to lose wt. Pt c/o wt gain after surgery. Per EMR, Pt lost 13 lb from 10/2015-06/2016 and gained 15 lb from 06/2016-09/2016. Wt loss tips reviewed. Pt is pre-diabetic and is aware of pre-diabetes dx. Pt expressed understanding of the information reviewed. Pt aware of nutrition education classes offered and is unable to attend nutrition classes.  Lab Results  Component Value Date   HGBA1C 6.1 (H) 08/24/2016   Wt Readings from Last 3 Encounters:  10/04/16 187 lb 6.3 oz (85 kg)  09/09/16 180 lb (81.6 kg)  09/06/16 179 lb 6.4 oz (81.4 kg)    Nutrition Diagnosis ? Food-and nutrition-related knowledge deficit related to lack of exposure to information as related to diagnosis of: ? CVD ? Pre-DM ? Overweight related to excessive energy intake as evidenced by a BMI of 28.8  Nutrition Intervention ? Pt's individual nutrition plan reviewed with pt. ? Benefits of adopting Therapeutic Lifestyle Changes discussed when Med[employer]s reviewed.   ? Pt given handouts for: ? Nutrition I class ? Nutrition II class ? Continue client-centered nutrition education by RD, as part of interdisciplinary care. Goal(s) ? Pt to identify food quantities necessary to achieve weight loss of 6-20 lb at graduation from cardiac rehab.  ? Pt able to name foods that affect blood glucose   Mickle PlumbEdna Akshara Blumenthal, M.Ed, RD, LDN, CDE 11/16/2016 9:59 AM

## 2016-11-17 ENCOUNTER — Other Ambulatory Visit: Payer: Self-pay | Admitting: *Deleted

## 2016-11-17 DIAGNOSIS — J321 Chronic frontal sinusitis: Secondary | ICD-10-CM

## 2016-11-17 MED ORDER — CETIRIZINE HCL 10 MG PO TABS: 10.0000 mg | ORAL_TABLET | Freq: Every day | ORAL | 11 refills | Status: DC

## 2016-11-18 ENCOUNTER — Encounter (HOSPITAL_COMMUNITY)
Admission: RE | Admit: 2016-11-18 | Discharge: 2016-11-18 | Disposition: A | Discharge status: Home / Self Care | Payer: Medicare Other | Source: Ambulatory Visit | Attending: Cardiovascular Disease | Admitting: Cardiovascular Disease

## 2016-11-18 DIAGNOSIS — I214 Non-ST elevation (NSTEMI) myocardial infarction: Secondary | ICD-10-CM

## 2016-11-18 DIAGNOSIS — Z955 Presence of coronary angioplasty implant and graft: Secondary | ICD-10-CM | POA: Diagnosis not present

## 2016-11-21 ENCOUNTER — Encounter (HOSPITAL_COMMUNITY): Payer: Medicare Other

## 2016-11-22 NOTE — Progress Notes (Signed)
Cardiac Individual Treatment Plan  Patient Details  Name: Wayne DressJerry L Beissel MRN: [id number] Date of Birth: May 03, 1951 Referring Provider:     CARDIAC REHAB PHASE II ORIENTATION [facility]2016 [facility]  Referring Provider  Nicki GuadalajaraKelly, Thomas MD      Initial Encounter Date:    CARDIAC REHAB PHASE II [facility]ATION [facility]  Date  10/04/16  Referring Provider  Nicki GuadalajaraKelly, Thomas MD      Visit Diagnosis: 08/23/16 NSTEMI (non-ST elevated myocardial infarction) (HCC)  08/24/16 Status post coronary artery stent placement  Patient's Home Medications on Admission:  Current Outpatient Prescriptions:  .  amLODipine (NORVASC) 5 MG tablet, TAKE 1 TABLET BY MOUTH EVERY DAY, Disp: 30 tablet, Rfl: 3 .  aspirin EC 81 MG EC tablet, Take 1 tablet (81 mg total) by mouth daily., Disp: , Rfl:  .  cetirizine (ZYRTEC) 10 MG tablet, Take 1 tablet (10 mg total) by mouth at bedtime., Disp: 30 tablet, Rfl: 11 .  clopidogrel (PLAVIX) 75 MG tablet, Take 1 tablet (75 mg total) by mouth daily., Disp: 30 tablet, Rfl: 11 .  DESCOVY 200-25 MG tablet, TAKE 1 TABLET BY MOUTH ONCE DAILY, Disp: 30 tablet, Rfl: 5 .  methocarbamol (ROBAXIN) 500 MG tablet, Take 1 tablet by mouth as needed., Disp: , Rfl: 0 .  metoprolol tartrate (LOPRESSOR) 25 MG tablet, Take 0.5 tablets (12.5 mg total) by mouth 2 (two) times daily., Disp: 60 tablet, Rfl: 3 .  mometasone (NASONEX) 50 MCG/ACT nasal spray, Place 2 sprays into the nose daily. (Patient taking differently: Place 2 sprays into the nose daily as needed (allergies). ), Disp: 17 g, Rfl: 12 .  Multiple Vitamin (MULTIVITAMIN) tablet, Take 1 tablet by mouth daily., Disp: , Rfl:  .  nitroGLYCERIN (NITROSTAT) 0.4 MG SL tablet, Place 1 tablet (0.4 mg total) under the tongue every 5 (five) minutes x 3 doses as needed for chest pain., Disp: 25 tablet, Rfl: 3 .  oxyCODONE-acetaminophen (PERCOCET/ROXICET) 5-325 MG tablet,  Take 1 tablet by mouth daily as needed., Disp: , Rfl:  .  pravastatin (PRAVACHOL) 40 MG tablet, TAKE 1 TABLET BY MOUTH DAILY, Disp: 30 tablet, Rfl: 5 .  PREZCOBIX 800-150 MG tablet, TAKE 1 TABLET BY MOUTH DAILY, Disp: 30 tablet, Rfl: 5 .  Vitamin D, Ergocalciferol, (DRISDOL) 50000 UNITS CAPS capsule, Take 1 capsule (50,000 Units total) by mouth every 7 (seven) days., Disp: 8 capsule, Rfl: 0  Past Medical History: Past Medical History:  Diagnosis Date  . Anemia   . AVN (avascular necrosis of bone) (HCC)   . Benign paroxysmal positional vertigo 06/15/2015  . CAD (coronary artery disease), native coronary artery    a. 08/24/16 NSTEMI/PCI: LM nl, LAD 40p, D1 60ost, D2 80ost-small, LCX 1721m (2.5x30 ONyx DES), OM1/2 min irregs, OM3 nl, RCA 30p/m, RPDA/RPAV/RPL1/RPL2/RPL3 nl, EF 55-65%.  . Childhood asthma   . GERD (gastroesophageal reflux disease)   . Glaucoma   . History of pneumonia    "4 times in my life" (08/24/2016)  . HIV infection (HCC) dx'd in the 1990s   "I've been undetectable 19 times" (08/24/2016)  . Hyperlipidemia   . Hypertension   . Memory impairment 06/15/2015  . Osteoporosis   . Primary localized osteoarthrosis of left hip 07/05/2016  . Seasonal allergies   . Tobacco abuse     Tobacco Use: History  Smoking Status  . Former Smoker  . Packs/day: 0.50  . Years: 47.00  . Types:  Cigarettes  Smokeless Tobacco  . Never Used    Labs: Recent Review Flowsheet Data    Labs for ITP Cardiac and Pulmonary Rehab Latest Ref Rng & Units 02/04/2013 12/18/2013 04/30/2014 01/26/2015 08/24/2016   Cholestrol 0 - 200 mg/dL - 161159 096142 045167 [id number]   LDLCALC 0 - 99 mg/dL - 79 87 811113 79   HDL >91>40 mg/dL - 47(W32(L) 29(F30(L) 62(Z32(L) 30(Q30(L)   Trlycerides <150 mg/dL - 657(Q238(H) 469127 629112 528127   Hemoglobin A1c 4.8 - 5.6 % 6.2(H) 6.3(H) - - 6.1(H)      Capillary Blood Glucose: No results found for: GLUCAP   Exercise Target Goals:    Exercise Program Goal: Individual exercise prescription set with THRR, safety &  activity barriers. Participant demonstrates ability to understand and report RPE using BORG scale, to self-measure pulse accurately, and to acknowledge the importance of the exercise prescription.  Exercise Prescription Goal: Starting with aerobic activity 30 plus minutes a day, 3 days per week for initial exercise prescription. Provide home exercise prescription and guidelines that participant acknowledges understanding prior to discharge.  Activity Barriers & Risk Stratification:     Activity Barriers & Cardiac Risk Stratification - 10/04/16 1345      Activity Barriers & Cardiac Risk Stratification   Activity Barriers Deconditioning;Muscular Weakness;Shortness of Breath   Cardiac Risk Stratification High      6 Minute Walk:     6 Minute Walk    Row Name 10/04/16 1552         6 Minute Walk   Phase Initial     Distance 1283 feet     Walk Time 6 minutes     # of Rest Breaks 0     MPH 2.43     METS 2.9     RPE 9     VO2 Peak 10.17     Symptoms No     Resting HR 56 bpm     Resting BP 108/60     Max Ex. HR 81 bpm     Max Ex. BP 132/70     2 Minute Post BP 124/72        Oxygen Initial Assessment:   Oxygen Re-Evaluation:   Oxygen Discharge (Final Oxygen Re-Evaluation):   Initial Exercise Prescription:     Initial Exercise Prescription - 10/04/16 1500      Date of Initial Exercise RX and Referring Provider   Date 10/04/16   Referring Provider Nicki GuadalajaraKelly, Thomas MD     Treadmill   MPH 2.5   Grade 0   Minutes 10   METs 2.91     Bike   Level 0.7   Minutes 10   METs 2.55     NuStep   Level 3   SPM 80   Minutes 10   METs 2.3     Prescription Details   Frequency (times per week) 3   Duration Progress to 30 minutes of continuous aerobic without signs/symptoms of physical distress     Intensity   THRR 40-80% of Max Heartrate 62-124   Ratings of Perceived Exertion 11-13   Perceived Dyspnea 0-4     Progression   Progression Continue to progress  workloads to maintain intensity without signs/symptoms of physical distress.     Resistance Training   Training Prescription Yes   Weight 3lbs   Reps 10-15      Perform Capillary Blood Glucose checks as needed.  Exercise Prescription Changes:     Exercise Prescription Changes  Row Name 10/13/16 1200 10/28/16 1400 11/17/16 1300         Response to Exercise   Blood Pressure (Admit) 122/60 132/72 114/72     Blood Pressure (Exercise) 140/80 134/80 138/82     Blood Pressure (Exit) 108/60 106/62 122/72     Heart Rate (Admit) 66 bpm 59 bpm 59 bpm     Heart Rate (Exercise) 96 bpm 88 bpm 199 bpm     Heart Rate (Exit) 66 bpm 59 bpm 66 bpm     Rating of Perceived Exertion (Exercise) 12 13 12      Symptoms none  - none     Comments Pt oriented to exercise equipment on 10/12/16  -  -     Duration Continue with 30 min of aerobic exercise without signs/symptoms of physical distress. Continue with 30 min of aerobic exercise without signs/symptoms of physical distress. Continue with 30 min of aerobic exercise without signs/symptoms of physical distress.     Intensity THRR unchanged THRR unchanged THRR unchanged       Progression   Progression Continue to progress workloads to maintain intensity without signs/symptoms of physical distress. Continue to progress workloads to maintain intensity without signs/symptoms of physical distress. Continue to progress workloads to maintain intensity without signs/symptoms of physical distress.     Average METs 2.3 3 3.4       Resistance Training   Training Prescription Yes Yes Yes     Weight 3lbs 3lbs 3lbs     Reps 10-15 10-15 10-15     Time 10 Minutes 10 Minutes 10 Minutes       Treadmill   MPH  - 2.6 2.6     Grade  - 2 2     Minutes  - 10 10     METs  - 3.71 3.71       Bike   Level 0.7 0.7 1.2     Minutes 10 10 10      METs 2.55 2.55 3.66       NuStep   Level 3 4 4      SPM 80 80 80     Minutes 10 10 10      METs 2.2 2.5 2.9       Track    Laps 8  -  -     Minutes 10  -  -     METs 2.39  -  -       Home Exercise Plan   Plans to continue exercise at  -  - Home (comment)  walk and go to [employer]  -  - Add 2 additional days to program exercise sessions.     Initial Home Exercises Provided  -  - 11/11/16        Exercise Comments:     Exercise Comments    Row Name 10/24/16 1102           Exercise Comments Pt is tolerating exercise well; will continue to monitor activity levels and exercise progression.          Exercise Goals and Review:     Exercise Goals    Row Name 10/04/16 1346 10/04/16 1353           Exercise Goals   Increase Physical Activity Yes  -      Intervention Provide advice, education, support and counseling about physical activity/exercise needs.;Develop an individualized exercise prescription for aerobic and resistive training based on initial evaluation findings,  risk stratification, comorbidities and participant's personal goals.  -      Expected Outcomes Achievement of increased cardiorespiratory fitness and enhanced flexibility, muscular endurance and strength shown through measurements of functional capacity and personal statement of participant.  -      Increase Strength and Stamina Yes -  learn activity limitations      Intervention Develop an individualized exercise prescription for aerobic and resistive training based on initial evaluation findings, risk stratification, comorbidities and participant's personal goals.;Provide advice, education, support and counseling about physical activity/exercise needs.  -      Expected Outcomes Achievement of increased cardiorespiratory fitness and enhanced flexibility, muscular endurance and strength shown through measurements of functional capacity and personal statement of participant.  -         Exercise Goals Re-Evaluation :     Exercise Goals Re-Evaluation    Row Name 10/24/16 1102 11/11/16 1522           Exercise Goal  Re-Evaluation   Exercise Goals Review Increase Physical Activity;Increase Strenth and Stamina Increase Physical Activity;Increase Strenth and Stamina      Comments Pt is exercising for 30 minutes and actively participates in cardiac rehab Reviewed home exercise with pt today.  Pt plans to walk and/or go to [facility] for exercise.  Reviewed THR, pulse, RPE, sign and symptoms, NTG use, and when to call 911 or MD.  Also discussed weather considerations and indoor options.  Pt voiced understanding.      Expected Outcomes Pt will continue to build on aerobic capacity Pt will continue to build on aerobic capacity          Discharge Exercise Prescription (Final Exercise Prescription Changes):     Exercise Prescription Changes - 11/17/16 1300      Response to Exercise   Blood Pressure (Admit) 114/72   Blood Pressure (Exercise) 138/82   Blood Pressure (Exit) 122/72   Heart Rate (Admit) 59 bpm   Heart Rate (Exercise) 199 bpm   Heart Rate (Exit) 66 bpm   Rating of Perceived Exertion (Exercise) 12   Symptoms none   Duration Continue with 30 min of aerobic exercise without signs/symptoms of physical distress.   Intensity THRR unchanged     Progression   Progression Continue to progress workloads to maintain intensity without signs/symptoms of physical distress.   Average METs 3.4     Resistance Training   Training Prescription Yes   Weight 3lbs   Reps 10-15   Time 10 Minutes     Treadmill   MPH 2.6   Grade 2   Minutes 10   METs 3.71     Bike   Level 1.2   Minutes 10   METs 3.66     NuStep   Level 4   SPM 80   Minutes 10   METs 2.9     Home Exercise Plan   Plans to continue exercise at Home (comment)  walk and go to [employer] Add 2 additional days to program exercise sessions.   Initial Home Exercises Provided 11/11/16      Nutrition:  Target Goals: Understanding of nutrition guidelines, daily intake of sodium 1500mg , cholesterol 200mg , calories 30% from  fat and 7% or less from saturated fats, daily to have 5 or more servings of fruits and vegetables.  Biometrics:     Pre Biometrics - 10/04/16 1557      Pre Biometrics   Waist Circumference 38.5 inches   Hip Circumference 40.5  inches   Waist to Hip Ratio 0.95 %   Triceps Skinfold 19 mm   % Body Fat 28.1 %   Grip Strength 40.5 kg   Flexibility 8 in   Single Leg Stand 20 seconds       Nutrition Therapy Plan and Nutrition Goals:     Nutrition Therapy & Goals - 11/16/16 1006      Nutrition Therapy   Diet Therapeutic Lifestyle Changes     Personal Nutrition Goals   Nutrition Goal Pt to identify food quantities necessary to achieve weight loss of 6-20 lb at graduation from cardiac rehab.    Personal Goal #2 Pt able to name foods that affect blood glucose      Intervention Plan   Intervention Prescribe, educate and counsel regarding individualized specific dietary modifications aiming towards targeted core components such as weight, hypertension, lipid management, diabetes, heart failure and other comorbidities.   Expected Outcomes Short Term Goal: Understand basic principles of dietary content, such as calories, fat, sodium, cholesterol and nutrients.;Long Term Goal: Adherence to prescribed nutrition plan.      Nutrition Discharge: Nutrition Scores:     Nutrition Assessments - 11/16/16 1006      MEDFICTS Scores   Pre Score 33      Nutrition Goals Re-Evaluation:   Nutrition Goals Re-Evaluation:   Nutrition Goals Discharge (Final Nutrition Goals Re-Evaluation):   Psychosocial: Target Goals: Acknowledge presence or absence of significant depression and/or stress, maximize coping skills, provide positive support system. Participant is able to verbalize types and ability to use techniques and skills needed for reducing stress and depression.  Initial Review & Psychosocial Screening:     Initial Psych Review & Screening - 10/04/16 1640      Initial Review    Current issues with None Identified     Family Dynamics   Good Support System? Yes  friends, family   Comments upon brief assessment, no psychosocial needs identified, no interventions necessary      Barriers   Psychosocial barriers to participate in program There are no identifiable barriers or psychosocial needs.     Screening Interventions   Interventions Encouraged to exercise;Provide feedback about the scores to participant      Quality of Life Scores:     Quality of Life - 10/19/16 1109      Quality of Life Scores   Health/Function Pre 29.9 %   Socioeconomic Pre 22.3 %   Psych/Spiritual Pre 23.14 %   Family Pre 16 %  pt experience situational stress with his siblings and extended family. pt is learning to set healthy boundaries to avoid being taken advantage of and decrease stress/ anxiety.  this ia new behavior for handling situations that arise.    GLOBAL Pre 21.4 %  pt offered emotional support and reassurance. pt encouraged to continue establishing healthy boundaries to increase improve coping skills and reduce stress/anixiety.        PHQ-9: Recent Review Flowsheet Data    Depression screen [facility] 2/9 10/12/2016 09/09/2016 11/02/2015 06/15/2015 02/09/2015   Decreased Interest 0 0 0 0 0   Down, Depressed, Hopeless 0 0 0 0 0   PHQ - 2 Score 0 0 0 0 0     Interpretation of Total Score  Total Score Depression Severity:  1-4 = Minimal depression, 5-9 = Mild depression, 10-14 = Moderate depression, 15-19 = Moderately severe depression, 20-27 = Severe depression   Psychosocial Evaluation and Intervention:     Psychosocial Evaluation - 10/12/16 1015  Psychosocial Evaluation & Interventions   Interventions Encouraged to exercise with the program and follow exercise prescription   Comments no psychosocial needs identified, no interventions necessary.    Expected Outcomes pt will exhibit positive outlook with good coping skills.    Continue Psychosocial Services  No Follow  up required      Psychosocial Re-Evaluation:     Psychosocial Re-Evaluation    Row Name 10/25/16 1539 11/16/16 1711           Psychosocial Re-Evaluation   Current issues with None Identified None Identified      Comments no psychosocial needs identified, no interventions necessary  no psychosocial needs identified, no interventions necessary       Expected Outcomes pt will exhibit positive outlook with good coping skills.  pt will exhibit positive outlook with good coping skills.       Interventions Encouraged to attend Cardiac Rehabilitation for the exercise;Stress management education;Relaxation education Encouraged to attend Cardiac Rehabilitation for the exercise;Stress management education;Relaxation education      Continue Psychosocial Services  No Follow up required No Follow up required         Psychosocial Discharge (Final Psychosocial Re-Evaluation):     Psychosocial Re-Evaluation - 11/16/16 1711      Psychosocial Re-Evaluation   Current issues with None Identified   Comments no psychosocial needs identified, no interventions necessary    Expected Outcomes pt will exhibit positive outlook with good coping skills.    Interventions Encouraged to attend Cardiac Rehabilitation for the exercise;Stress management education;Relaxation education   Continue Psychosocial Services  No Follow up required      Vocational Rehabilitation: Provide vocational rehab assistance to qualifying candidates.   Vocational Rehab Evaluation & Intervention:     Vocational Rehab - 10/19/16 1109      Initial Vocational Rehab Evaluation & Intervention   Assessment shows need for Vocational Rehabilitation Yes   Vocational Rehab Packet given to patient 10/19/16      Education: Education Goals: Education classes will be provided on a weekly basis, covering required topics. Participant will state understanding/return demonstration of topics presented.  Learning Barriers/Preferences:      Learning Barriers/Preferences - 10/04/16 1345      Learning Barriers/Preferences   Learning Barriers Sight   Learning Preferences Written Material;Verbal Instruction;Skilled Demonstration      Education Topics: Count Your Pulse:  -Group instruction provided by verbal instruction, demonstration, patient participation and written materials to support subject.  Instructors address importance of being able to find your pulse and how to count your pulse when at home without a heart monitor.  Patients get hands on experience counting their pulse with staff help and individually.   CARDIAC REHAB PHASE II EXERCISE from 11/18/2016 in [facility] CARDIAC REHAB  Date  11/11/16  Educator  RN  Instruction Review Code  2- meets goals/outcomes      Heart Attack, Angina, and Risk Factor Modification:  -Group instruction provided by verbal instruction, video, and written materials to support subject.  Instructors address signs and symptoms of angina and heart attacks.    Also discuss risk factors for heart disease and how to make changes to improve heart health risk factors.   Functional Fitness:  -Group instruction provided by verbal instruction, demonstration, patient participation, and written materials to support subject.  Instructors address safety measures for doing things around the house.  Discuss how to get up and down off the floor, how to pick things up properly, how to safely  get out of a chair without assistance, and balance training.   CARDIAC REHAB PHASE II EXERCISE from 11/18/2016 in [facility] CARDIAC REHAB  Date  10/21/16  Instruction Review Code  2- meets goals/outcomes      Meditation and Mindfulness:  -Group instruction provided by verbal instruction, patient participation, and written materials to support subject.  Instructor addresses importance of mindfulness and meditation practice to help reduce stress and improve awareness.  Instructor also  leads participants through a meditation exercise.    CARDIAC REHAB PHASE II EXERCISE from 11/18/2016 in [facility] CARDIAC REHAB  Date  10/12/16  Instruction Review Code  2- meets goals/outcomes      Stretching for Flexibility and Mobility:  -Group instruction provided by verbal instruction, patient participation, and written materials to support subject.  Instructors lead participants through series of stretches that are designed to increase flexibility thus improving mobility.  These stretches are additional exercise for major muscle groups that are typically performed during regular warm up and cool down.   CARDIAC REHAB PHASE II EXERCISE from 11/18/2016 in [facility] CARDIAC REHAB  Date  10/28/16  Instruction Review Code  2- meets goals/outcomes      Hands Only CPR:  -Group verbal, video, and participation provides a basic overview of AHA guidelines for community CPR. Role-play of emergencies allow participants the opportunity to practice calling for help and chest compression technique with discussion of AED use.   Hypertension: -Group verbal and written instruction that provides a basic overview of hypertension including the most recent diagnostic guidelines, risk factor reduction with self-care instructions and medication management.   CARDIAC REHAB PHASE II EXERCISE from 11/18/2016 in [facility] CARDIAC REHAB  Date  10/14/16  Instruction Review Code  2- meets goals/outcomes       Nutrition I class: Heart Healthy Eating:  -Group instruction provided by PowerPoint slides, verbal discussion, and written materials to support subject matter. The instructor gives an explanation and review of the Therapeutic Lifestyle Changes diet recommendations, which includes a discussion on lipid goals, dietary fat, sodium, fiber, plant stanol/sterol esters, sugar, and the components of a well-balanced, healthy diet.   CARDIAC REHAB PHASE II  EXERCISE from 11/18/2016 in [facility] CARDIAC REHAB  Date  11/16/16  Educator  RD  Instruction Review Code  Not applicable      Nutrition II class: Lifestyle Skills:  -Group instruction provided by PowerPoint slides, verbal discussion, and written materials to support subject matter. The instructor gives an explanation and review of label reading, grocery shopping for heart health, heart healthy recipe modifications, and ways to make healthier choices when eating out.   CARDIAC REHAB PHASE II EXERCISE from 11/18/2016 in [facility] CARDIAC REHAB  Date  11/16/16  Educator  RD  Instruction Review Code  Not applicable      Diabetes Question & Answer:  -Group instruction provided by PowerPoint slides, verbal discussion, and written materials to support subject matter. The instructor gives an explanation and review of diabetes co-morbidities, pre- and post-prandial blood glucose goals, pre-exercise blood glucose goals, signs, symptoms, and treatment of hypoglycemia and hyperglycemia, and foot care basics.   CARDIAC REHAB PHASE II EXERCISE from 11/18/2016 in [facility] CARDIAC REHAB  Date  11/18/16  Educator  RD  Instruction Review Code  2- meets goals/outcomes      Diabetes Blitz:  -Group instruction provided by PowerPoint slides, verbal discussion, and written materials  to support subject matter. The instructor gives an explanation and review of the physiology behind type 1 and type 2 diabetes, diabetes medications and rational behind using different medications, pre- and post-prandial blood glucose recommendations and Hemoglobin A1c goals, diabetes diet, and exercise including blood glucose guidelines for exercising safely.    Portion Distortion:  -Group instruction provided by PowerPoint slides, verbal discussion, written materials, and food models to support subject matter. The instructor gives an explanation of serving size versus  portion size, changes in portions sizes over the last 20 years, and what consists of a serving from each food group.   CARDIAC REHAB PHASE II EXERCISE from 11/18/2016 in [facility] CARDIAC REHAB  Date  10/26/16  Educator  RD  Instruction Review Code  2- meets goals/outcomes      Stress Management:  -Group instruction provided by verbal instruction, video, and written materials to support subject matter.  Instructors review role of stress in heart disease and how to cope with stress positively.     Exercising on Your Own:  -Group instruction provided by verbal instruction, power point, and written materials to support subject.  Instructors discuss benefits of exercise, components of exercise, frequency and intensity of exercise, and end points for exercise.  Also discuss use of nitroglycerin and activating EMS.  Review options of places to exercise outside of rehab.  Review guidelines for sex with heart disease.   Cardiac Drugs I:  -Group instruction provided by verbal instruction and written materials to support subject.  Instructor reviews cardiac drug classes: antiplatelets, anticoagulants, beta blockers, and statins.  Instructor discusses reasons, side effects, and lifestyle considerations for each drug class.   Cardiac Drugs II:  -Group instruction provided by verbal instruction and written materials to support subject.  Instructor reviews cardiac drug classes: angiotensin converting enzyme inhibitors (ACE-I), angiotensin II receptor blockers (ARBs), nitrates, and calcium channel blockers.  Instructor discusses reasons, side effects, and lifestyle considerations for each drug class.   CARDIAC REHAB PHASE II EXERCISE from 11/18/2016 in [facility] CARDIAC REHAB  Date  11/16/16  Educator  Zena Amos  Instruction Review Code  2- meets goals/outcomes      Anatomy and Physiology of the Circulatory System:  Group verbal and written instruction and models  provide basic cardiac anatomy and physiology, with the coronary electrical and arterial systems. Review of: AMI, Angina, Valve disease, Heart Failure, Peripheral Artery Disease, Cardiac Arrhythmia, Pacemakers, and the ICD.   Other Education:  -Group or individual verbal, written, or video instructions that support the educational goals of the cardiac rehab program.   Knowledge Questionnaire Score:     Knowledge Questionnaire Score - 10/04/16 0945      Knowledge Questionnaire Score   Pre Score 19/24      Core Components/Risk Factors/Patient Goals at Admission:     Personal Goals and Risk Factors at Admission - 10/04/16 1558      Core Components/Risk Factors/Patient Goals on Admission   Hypertension Yes   Intervention Monitor prescription use compliance.;Provide education on lifestyle modifcations including regular physical activity/exercise, weight management, moderate sodium restriction and increased consumption of fresh fruit, vegetables, and low fat dairy, alcohol moderation, and smoking cessation.   Expected Outcomes Short Term: Continued assessment and intervention until BP is < 140/50mm HG in hypertensive participants. < 130/10mm HG in hypertensive participants with diabetes, heart failure or chronic kidney disease.;Long Term: Maintenance of blood pressure at goal levels.   Lipids Yes   Intervention Provide education and  support for participant on nutrition & aerobic/resistive exercise along with prescribed medications to achieve LDL 70mg , HDL >40mg .   Expected Outcomes Short Term: Participant states understanding of desired cholesterol values and is compliant with medications prescribed. Participant is following exercise prescription and nutrition guidelines.;Long Term: Cholesterol controlled with medications as prescribed, with individualized exercise RX and with personalized nutrition plan. Value goals: LDL < 70mg , HDL > 40 mg.      Core Components/Risk Factors/Patient Goals  Review:      Goals and Risk Factor Review    Row Name 10/25/16 1537 11/16/16 1711           Core Components/Risk Factors/Patient Goals Review   Personal Goals Review Hypertension;Lipids;Other Hypertension;Lipids;Other      Review pt with desire to reduce risk factors exhibits eagerness to participate in CR activities.  pt with desire to reduce risk factors exhibits eagerness to participate in CR activities.       Expected Outcomes pt will participate in CR exercise, nutrition and lifestyle modification education to reduce overall risk factors.  pt will participate in CR exercise, nutrition and lifestyle modification education to reduce overall risk factors.          Core Components/Risk Factors/Patient Goals at Discharge (Final Review):      Goals and Risk Factor Review - 11/16/16 1711      Core Components/Risk Factors/Patient Goals Review   Personal Goals Review Hypertension;Lipids;Other   Review pt with desire to reduce risk factors exhibits eagerness to participate in CR activities.    Expected Outcomes pt will participate in CR exercise, nutrition and lifestyle modification education to reduce overall risk factors.       ITP Comments:     ITP Comments    Row Name 10/04/16 1342 10/25/16 1536 11/22/16 1607       ITP Comments Medical Director, Dr. Armanda Magic Medical Director, Dr. Armanda Magic Medical Director, Dr. Armanda Magic        Comments: Pt is making expected progress toward personal goals after completing 10 sessions. Recommend continued exercise and life style modification education including  stress management and relaxation techniques to decrease cardiac risk profile.

## 2016-11-23 ENCOUNTER — Encounter (HOSPITAL_COMMUNITY)
Admission: RE | Admit: 2016-11-23 | Discharge: 2016-11-23 | Disposition: A | Discharge status: Home / Self Care | Payer: Medicare Other | Source: Ambulatory Visit | Attending: Cardiovascular Disease | Admitting: Cardiovascular Disease

## 2016-11-23 DIAGNOSIS — Z955 Presence of coronary angioplasty implant and graft: Secondary | ICD-10-CM

## 2016-11-23 DIAGNOSIS — I214 Non-ST elevation (NSTEMI) myocardial infarction: Secondary | ICD-10-CM

## 2016-11-25 ENCOUNTER — Encounter (HOSPITAL_COMMUNITY)
Admission: RE | Admit: 2016-11-25 | Discharge: 2016-11-25 | Disposition: A | Discharge status: Home / Self Care | Payer: Medicare Other | Source: Ambulatory Visit | Attending: Cardiovascular Disease | Admitting: Cardiovascular Disease

## 2016-11-25 DIAGNOSIS — Z955 Presence of coronary angioplasty implant and graft: Secondary | ICD-10-CM

## 2016-11-25 DIAGNOSIS — I214 Non-ST elevation (NSTEMI) myocardial infarction: Secondary | ICD-10-CM

## 2016-11-28 ENCOUNTER — Encounter (HOSPITAL_COMMUNITY): Admission: RE | Admit: 2016-11-28 | Payer: Medicare Other | Source: Ambulatory Visit

## 2016-11-30 ENCOUNTER — Encounter (HOSPITAL_COMMUNITY)
Admission: RE | Admit: 2016-11-30 | Discharge: 2016-11-30 | Disposition: A | Discharge status: Home / Self Care | Payer: Medicare Other | Source: Ambulatory Visit | Attending: Cardiovascular Disease | Admitting: Cardiovascular Disease

## 2016-11-30 DIAGNOSIS — I214 Non-ST elevation (NSTEMI) myocardial infarction: Secondary | ICD-10-CM

## 2016-11-30 DIAGNOSIS — Z955 Presence of coronary angioplasty implant and graft: Secondary | ICD-10-CM | POA: Diagnosis not present

## 2016-12-02 ENCOUNTER — Encounter (HOSPITAL_COMMUNITY)
Admission: RE | Admit: 2016-12-02 | Discharge: 2016-12-02 | Disposition: A | Discharge status: Home / Self Care | Payer: Medicare Other | Source: Ambulatory Visit | Attending: Cardiovascular Disease | Admitting: Cardiovascular Disease

## 2016-12-02 ENCOUNTER — Other Ambulatory Visit: Payer: Self-pay | Admitting: Infectious Disease

## 2016-12-02 DIAGNOSIS — Z955 Presence of coronary angioplasty implant and graft: Secondary | ICD-10-CM

## 2016-12-02 DIAGNOSIS — I214 Non-ST elevation (NSTEMI) myocardial infarction: Secondary | ICD-10-CM

## 2016-12-02 DIAGNOSIS — E7849 Other hyperlipidemia: Secondary | ICD-10-CM

## 2016-12-05 ENCOUNTER — Encounter (HOSPITAL_COMMUNITY): Payer: Medicare Other

## 2016-12-07 ENCOUNTER — Encounter (HOSPITAL_COMMUNITY)
Admission: RE | Admit: 2016-12-07 | Discharge: 2016-12-07 | Disposition: A | Discharge status: Home / Self Care | Payer: Medicare Other | Source: Ambulatory Visit | Attending: Cardiovascular Disease | Admitting: Cardiovascular Disease

## 2016-12-07 ENCOUNTER — Other Ambulatory Visit: Payer: Self-pay | Admitting: Infectious Disease

## 2016-12-07 VITALS — Wt 189.6 lb

## 2016-12-07 DIAGNOSIS — Z955 Presence of coronary angioplasty implant and graft: Secondary | ICD-10-CM | POA: Insufficient documentation

## 2016-12-07 DIAGNOSIS — I10 Essential (primary) hypertension: Secondary | ICD-10-CM | POA: Insufficient documentation

## 2016-12-07 DIAGNOSIS — E785 Hyperlipidemia, unspecified: Secondary | ICD-10-CM | POA: Diagnosis not present

## 2016-12-07 DIAGNOSIS — I213 ST elevation (STEMI) myocardial infarction of unspecified site: Secondary | ICD-10-CM | POA: Insufficient documentation

## 2016-12-07 DIAGNOSIS — E7849 Other hyperlipidemia: Secondary | ICD-10-CM

## 2016-12-07 DIAGNOSIS — I214 Non-ST elevation (NSTEMI) myocardial infarction: Secondary | ICD-10-CM

## 2016-12-07 DIAGNOSIS — K219 Gastro-esophageal reflux disease without esophagitis: Secondary | ICD-10-CM | POA: Diagnosis not present

## 2016-12-07 DIAGNOSIS — Z87891 Personal history of nicotine dependence: Secondary | ICD-10-CM | POA: Insufficient documentation

## 2016-12-09 ENCOUNTER — Encounter (HOSPITAL_COMMUNITY)
Admission: RE | Admit: 2016-12-09 | Discharge: 2016-12-09 | Disposition: A | Discharge status: Home / Self Care | Payer: Medicare Other | Source: Ambulatory Visit | Attending: Cardiovascular Disease | Admitting: Cardiovascular Disease

## 2016-12-09 DIAGNOSIS — I214 Non-ST elevation (NSTEMI) myocardial infarction: Secondary | ICD-10-CM

## 2016-12-09 DIAGNOSIS — Z955 Presence of coronary angioplasty implant and graft: Secondary | ICD-10-CM | POA: Diagnosis not present

## 2016-12-12 ENCOUNTER — Encounter (HOSPITAL_COMMUNITY): Payer: Medicare Other

## 2016-12-13 ENCOUNTER — Ambulatory Visit (INDEPENDENT_AMBULATORY_CARE_PROVIDER_SITE_OTHER): Payer: Medicare Other | Admitting: Cardiovascular Disease

## 2016-12-13 ENCOUNTER — Encounter: Payer: Self-pay | Admitting: Cardiovascular Disease

## 2016-12-13 VITALS — BP 126/80 | HR 53 | Ht 68.5 in | Wt 190.0 lb

## 2016-12-13 DIAGNOSIS — Z8619 Personal history of other infectious and parasitic diseases: Secondary | ICD-10-CM | POA: Diagnosis not present

## 2016-12-13 DIAGNOSIS — I214 Non-ST elevation (NSTEMI) myocardial infarction: Secondary | ICD-10-CM | POA: Diagnosis not present

## 2016-12-13 DIAGNOSIS — I2510 Atherosclerotic heart disease of native coronary artery without angina pectoris: Secondary | ICD-10-CM

## 2016-12-13 DIAGNOSIS — E785 Hyperlipidemia, unspecified: Secondary | ICD-10-CM | POA: Diagnosis not present

## 2016-12-13 DIAGNOSIS — I10 Essential (primary) hypertension: Secondary | ICD-10-CM | POA: Diagnosis not present

## 2016-12-13 DIAGNOSIS — B20 Human immunodeficiency virus [HIV] disease: Secondary | ICD-10-CM

## 2016-12-13 NOTE — Patient Instructions (Signed)

## 2016-12-13 NOTE — Progress Notes (Signed)
Cardiology Office Note    Date:  12/13/2016   ID:  Wayne Santos, DOB 1951/02/09, MRN [id number]  PCP:  Willey Blade, MD  Cardiologist:  Shelva Majestic, MD   Chief Complaint  Patient presents with  . Follow-up   Initial evaluation with me  History of Present Illness:  Wayne Santos is a 66 y.o. male who presents to the office in follow-up of his non-ST segment myocardial infarction.  Wayne Santos is a 66 year old male who has a prior history of HIV, hypertension, hyperlipidemia, and presented to [facility] hospital on 08/24/2016 with unstable angina.  Troponins were mildly positive at 0.27.  He underwent cardiac catheterization by Dr. Velva Harman and was found to have a 95% mid circumflex stenosis which was successfully stented with a 2.530 mm Resolute onyx DES stent.  He had mild concomitant CAD with 30% proximal and mid RCA stenoses, 60% diagonal 1, 80% diagonal 2 stenoses and small vessels with mild 40% proximal LAD narrowing, irregularity in the circumflex marginal branches.  Socially, he quit smoking.  He is participating in cardiac rehabilitation.  He was seen by Murray Hodgkins.RNP for initial office follow-up.  He is undergoing cardiac rehabilitation at least 2-3 days per week and also works out of the gym 2-3 days per week.  He denies recurrent anginal symptoms.  He is hopeful to get back to work.  He denies PND or orthopnea.  He has been maintained on amlodipine 5 mg and metoprolol 12.50 g twice a day.  First, hypertension and CAD.  He is on aspirin and Plavix.  He denies bleeding.  He is on pravastatin 40 g, hyperlipidemia.  He had a low vitamin D level and was taking supplemental vitamin D.  He also is on HIV drug therapy and admits to undetectable virus.  He presents for his initial evaluation to see me in the office.   Past Medical History:  Diagnosis Date  . Anemia   . AVN (avascular necrosis of bone) ([place])   . Benign paroxysmal positional vertigo 06/15/2015  . CAD (coronary  artery disease), native coronary artery    a. 08/24/16 NSTEMI/PCI: LM nl, LAD 40p, D1 60ost, D2 80ost-small, LCX 41m(2.5x30 ONyx DES), OM1/2 min irregs, OM3 nl, RCA 30p/m, RPDA/RPAV/RPL1/RPL2/RPL3 nl, EF 55-65%.  . Childhood asthma   . GERD (gastroesophageal reflux disease)   . Glaucoma   . History of pneumonia    "4 times in my life" (08/24/2016)  . HIV infection ([place] in the 1990s   "I've been undetectable 19 times" (08/24/2016)  . Hyperlipidemia   . Hypertension   . Memory impairment 06/15/2015  . Osteoporosis   . Primary localized osteoarthrosis of left hip 07/05/2016  . Seasonal allergies   . Tobacco abuse     Past Surgical History:  Procedure Laterality Date  . CARDIAC CATHETERIZATION    . CORONARY ANGIOPLASTY WITH STENT PLACEMENT  08/24/2016  . CORONARY STENT INTERVENTION N/A 08/24/2016   Procedure: Coronary Stent Intervention;  Surgeon: MWellington Hampshire MD;  [facility];  Service: Cardiovascular;  Laterality: N/A;  . HAND NERVE REPAIR Right 1990s?   "caught it in a machine; had to be reconstructed"  . JOINT REPLACEMENT    . LEFT HEART CATH AND CORONARY ANGIOGRAPHY N/A 08/24/2016   Procedure: Left Heart Cath and Coronary Angiography;  Surgeon: MWellington Hampshire MD;  [facility];  Service: Cardiovascular;  Laterality: N/A;  . TOTAL HIP ARTHROPLASTY Right   . TOTAL  HIP ARTHROPLASTY Left 07/05/2016   Procedure: TOTAL HIP ARTHROPLASTY ANTERIOR APPROACH;  Surgeon: Melrose Nakayama, MD;  Location: [place];  Service: Orthopedics;  Laterality: Left;    Current Medications: Outpatient Medications Prior to Visit  Medication Sig Dispense Refill  . amLODipine (NORVASC) 5 MG tablet TAKE 1 TABLET BY MOUTH EVERY DAY 30 tablet 3  . aspirin EC 81 MG EC tablet Take 1 tablet (81 mg total) by mouth daily.    . cetirizine (ZYRTEC) 10 MG tablet Take 1 tablet (10 mg total) by mouth at bedtime. 30 tablet 11  . clopidogrel (PLAVIX) 75 MG tablet Take 1 tablet (75 mg  total) by mouth daily. 30 tablet 11  . DESCOVY 200-25 MG tablet TAKE 1 TABLET BY MOUTH ONCE DAILY 30 tablet 5  . methocarbamol (ROBAXIN) 500 MG tablet Take 1 tablet by mouth as needed.  0  . metoprolol tartrate (LOPRESSOR) 25 MG tablet Take 0.5 tablets (12.5 mg total) by mouth 2 (two) times daily. 60 tablet 3  . mometasone (NASONEX) 50 MCG/ACT nasal spray Place 2 sprays into the nose daily. (Patient taking differently: Place 2 sprays into the nose daily as needed (allergies). ) 17 g 12  . Multiple Vitamin (MULTIVITAMIN) tablet Take 1 tablet by mouth daily.    . nitroGLYCERIN (NITROSTAT) 0.4 MG SL tablet Place 1 tablet (0.4 mg total) under the tongue every 5 (five) minutes x 3 doses as needed for chest pain. 25 tablet 3  . oxyCODONE-acetaminophen (PERCOCET/ROXICET) 5-325 MG tablet Take 1 tablet by mouth daily as needed.    . pravastatin (PRAVACHOL) 40 MG tablet TAKE 1 TABLET BY MOUTH DAILY 30 tablet 5  . pravastatin (PRAVACHOL) 40 MG tablet TAKE 1 TABLET BY MOUTH DAILY 30 tablet 0  . PREZCOBIX 800-150 MG tablet TAKE 1 TABLET BY MOUTH DAILY 30 tablet 5  . Vitamin D, Ergocalciferol, (DRISDOL) 50000 UNITS CAPS capsule Take 1 capsule (50,000 Units total) by mouth every 7 (seven) days. 8 capsule 0   No facility-administered medications prior to visit.      Allergies:   Ace inhibitors   Social History   Social History  . Marital status: Single    Spouse name: N/A  . Number of children: N/A  . Years of education: N/A   Social History Main Topics  . Smoking status: Former Smoker    Packs/day: 0.50    Years: 47.00    Types: Cigarettes  . Smokeless tobacco: Never Used  . Alcohol use 0.0 oz/week     Comment: 08/24/2016 "I'll have a couple drinks maybe 4 times/year"  . Drug use: Yes    Types: Marijuana     Comment: 08/24/2016 "nothing since my younger days"  . Sexual activity: Yes     Comment: declined condoms   Other Topics Concern  . None   Social History Narrative  . None      Family History:  The patient's family history is not on file.   He admits to some family history of CVD.  ROS General: Negative; No fevers, chills, or night sweats;  HEENT: Negative; No changes in vision or hearing, sinus congestion, difficulty swallowing Pulmonary: Negative; No cough, wheezing, shortness of breath, hemoptysis Cardiovascular:  GI: Negative; No nausea, vomiting, diarrhea, or abdominal pain GU: Negative; No dysuria, hematuria, or difficulty voiding Musculoskeletal: Negative; no myalgias, joint pain, or weakness Hematologic/Oncology: Negative; no easy bruising, bleeding Infectious disease: Positive for HIV since the late 1980s or early 1990s Endocrine: Negative; no heat/cold intolerance; no  diabetes Neuro: Negative; no changes in balance, headaches Skin: Negative; No rashes or skin lesions Psychiatric: Negative; No behavioral problems, depression Sleep: Negative; No snoring, daytime sleepiness, hypersomnolence, bruxism, restless legs, hypnogognic hallucinations, no cataplexy Other comprehensive 14 point system review is negative.   PHYSICAL EXAM:   VS:  BP 126/80   Pulse (!) 53   Ht 5' 8.5" (1.74 m)   Wt 190 lb (86.2 kg)   BMI 28.47 kg/m    Wt Readings from Last 3 Encounters:  12/13/16 190 lb (86.2 kg)  10/04/16 187 lb 6.3 oz (85 kg)  09/09/16 180 lb (81.6 kg)    General: Alert, oriented, no distress.  Skin: normal turgor, no rashes, warm and dry HEENT: Normocephalic, atraumatic. Pupils equal round and reactive to light; sclera anicteric; extraocular muscles intact; Fundi Without hemorrhages or exudates Nose without nasal septal hypertrophy Mouth/Parynx benign; Mallinpatti scale 3 Neck: No JVD, no carotid bruits; normal carotid upstroke Lungs: clear to ausculatation and percussion; no wheezing or rales Chest wall: without tenderness to palpitation Heart: PMI not displaced, RRR, s1 s2 normal, 1/6 systolic murmur, no diastolic murmur, no rubs, gallops, thrills,  or heaves Abdomen: soft, nontender; no hepatosplenomehaly, BS+; abdominal aorta nontender and not dilated by palpation. Back: no CVA tenderness Pulses 2+ Musculoskeletal: full range of motion, normal strength, no joint deformities Extremities: no clubbing cyanosis or edema, Homan's sign negative  Neurologic: grossly nonfocal; Cranial nerves grossly wnl Psychologic: Normal mood and affect   Studies/Labs Reviewed:   EKG:  EKG is ordered today.  ECG (independently read by me): Sinus bradycardia 53 bpm.  Nonspecific T-wave abnormality.  Normal intervals.  No ectopy.  Recent Labs: BMP Latest Ref Rng & Units 08/25/2016 08/24/2016 08/23/2016  Glucose 65 - 99 mg/dL 96 125(H) 159(H)  BUN 6 - 20 mg/dL 10 8 8   Creatinine 0.61 - 1.24 mg/dL 0.88 0.82 0.99  Sodium 135 - 145 mmol/L 139 141 141  Potassium 3.5 - 5.1 mmol/L 3.7 2.9(L) 3.1(L)  Chloride 101 - 111 mmol/L 108 107 107  CO2 22 - 32 mmol/L 25 28 26   Calcium 8.9 - 10.3 mg/dL 9.1 9.1 9.1     Hepatic Function Latest Ref Rng & Units 08/23/2016 05/12/2016 06/01/2015  Total Protein 6.5 - 8.1 g/dL 6.7 7.4 7.0  Albumin 3.5 - 5.0 g/dL 3.7 4.3 4.2  AST 15 - 41 U/L 21 15 13   ALT 17 - 63 U/L 17 15 18   Alk Phosphatase 38 - 126 U/L 106 107 109  Total Bilirubin 0.3 - 1.2 mg/dL 0.3 0.4 0.3  Bilirubin, Direct - - - -    CBC Latest Ref Rng & Units 08/25/2016 08/24/2016 08/23/2016  WBC 4.0 - 10.5 K/uL 7.4 7.3 8.0  Hemoglobin 13.0 - 17.0 g/dL 12.5(L) 12.6(L) 12.6(L)  Hematocrit 39.0 - 52.0 % 38.0(L) 37.8(L) 37.3(L)  Platelets 150 - 400 K/uL 306 302 303   Lab Results  Component Value Date   MCV 90.3 08/25/2016   MCV 90.6 08/24/2016   MCV 89.9 08/23/2016   Lab Results  Component Value Date   TSH 0.788 12/21/2009   Lab Results  Component Value Date   HGBA1C 6.1 (H) 08/24/2016     BNP No results found for: BNP  ProBNP No results found for: PROBNP   Lipid Panel     Component Value Date/Time   CHOL 134 08/24/2016 0648   TRIG 127 08/24/2016  0648   HDL 30 (L) 08/24/2016 0648   CHOLHDL 4.5 08/24/2016 0648   VLDL  25 08/24/2016 0648   LDLCALC 79 08/24/2016 0648     RADIOLOGY: No results found.   Additional studies/ records that were reviewed today include:  I reviewed his hospital records from his 2018 admission.  I reviewed the catheterization report and subsequent office visit.    ASSESSMENT:    1. Non-ST elevation myocardial infarction (NSTEMI), subsequent episode of care ([place])   2. CAD in native artery   3. Essential hypertension   4. Hyperlipidemia LDL goal <70   5. History of HIV infection      PLAN:  Mr. Shanan Mcmiller is a 66 year old American male who has a ~30 year history of HIV which has been well treated with undetectable virus.  He has a history of hypertension, hyperlipidemia, and tobacco use with portal he has quit smoking since his non-STEMI.  I reviewed his cardiac catheterization findings.  He underwent successful stenting of a 95% mid circumflex stenosis with a Resolute DES stent.  He has mild concomitant CAD as noted above.  He has been without anginal symptomatology since his intervention.  He is which dissipated in cardiac rehabilitation and also exercising at the gym for at least a total of 5 days per week.  His blood pressure today is stable and on repeat by me was 112/64 on his current medical regimen.  Lipid studies in April 2018 showed a total cholesterol 124 with an LDL of 49.  At that time triglycerides were increased to 166 and VLDL was 33.  Prior to undergoing his heart attack, he had undergone hip surgery and tolerated that well.  He admits to weight gain since his surgery and with smoking cessation.  He has increased his activity and has been without anginal symptomatology.  He would like to be given clearance to return to work.  He is now almost 4 months since his MI.  I feel he can return to work and should initially have light duty restrictions with increase effort as tolerated.  If he does  also require DOT license.  He may require subsequent exercise nuclear imaging. His stent placement in April 2018.  He will continue his current regimen.  His ECG remained stable.  I will see him in 6 months for reevaluation.   Medication Adjustments/Labs and Tests Ordered: Current medicines are reviewed at length with the patient today.  Concerns regarding medicines are outlined above.  Medication changes, Labs and Tests ordered today are listed in the Patient Instructions below. Patient Instructions  Medication Instructions:  Your physician recommends that you continue on your current medications as directed. Please refer to the Current Medication list given to you today.  Follow-Up: Your physician wants you to follow-up in: 6 months with Dr. Claiborne Billings.  You will receive a reminder letter in the mail two months in advance. If you don't receive a letter, please call our office to schedule the follow-up appointment.   Any Other Special Instructions Will Be Listed Below (If Applicable).     If you need a refill on your cardiac medications before your next appointment, please call your pharmacy.     Signed, Shelva Majestic, MD, [facility]  12/13/2016 2:24 PM    [place] [address], [place], [place], [place]  [address] Phone: [phone]

## 2016-12-14 ENCOUNTER — Encounter (HOSPITAL_COMMUNITY)
Admission: RE | Admit: 2016-12-14 | Discharge: 2016-12-14 | Disposition: A | Discharge status: Home / Self Care | Payer: Medicare Other | Source: Ambulatory Visit | Attending: Cardiovascular Disease | Admitting: Cardiovascular Disease

## 2016-12-14 DIAGNOSIS — I214 Non-ST elevation (NSTEMI) myocardial infarction: Secondary | ICD-10-CM

## 2016-12-14 DIAGNOSIS — Z955 Presence of coronary angioplasty implant and graft: Secondary | ICD-10-CM | POA: Diagnosis not present

## 2016-12-16 ENCOUNTER — Encounter (HOSPITAL_COMMUNITY)
Admission: RE | Admit: 2016-12-16 | Discharge: 2016-12-16 | Disposition: A | Discharge status: Home / Self Care | Payer: Medicare Other | Source: Ambulatory Visit | Attending: Cardiovascular Disease | Admitting: Cardiovascular Disease

## 2016-12-16 DIAGNOSIS — I214 Non-ST elevation (NSTEMI) myocardial infarction: Secondary | ICD-10-CM

## 2016-12-16 DIAGNOSIS — Z955 Presence of coronary angioplasty implant and graft: Secondary | ICD-10-CM

## 2016-12-19 ENCOUNTER — Encounter (HOSPITAL_COMMUNITY): Payer: Medicare Other

## 2016-12-21 ENCOUNTER — Encounter (HOSPITAL_COMMUNITY)
Admission: RE | Admit: 2016-12-21 | Discharge: 2016-12-21 | Disposition: A | Discharge status: Home / Self Care | Payer: Medicare Other | Source: Ambulatory Visit | Attending: Cardiovascular Disease | Admitting: Cardiovascular Disease

## 2016-12-21 DIAGNOSIS — Z955 Presence of coronary angioplasty implant and graft: Secondary | ICD-10-CM | POA: Diagnosis not present

## 2016-12-21 DIAGNOSIS — I214 Non-ST elevation (NSTEMI) myocardial infarction: Secondary | ICD-10-CM

## 2016-12-22 NOTE — Progress Notes (Signed)
Cardiac Individual Treatment Plan  Patient Details  Name: Wayne Santos MRN: [id number] Date of Birth: 02/27/1951 Referring Provider:     CARDIAC REHAB PHASE II ORIENTATION from 10/04/2016 in [facility] CARDIAC REHAB  Referring Provider  Nicki Guadalajara MD      Initial Encounter Date:    CARDIAC REHAB PHASE II ORIENTATION from 10/04/2016 in [facility] CARDIAC REHAB  Date  10/04/16  Referring Provider  Nicki Guadalajara MD      Visit Diagnosis: 08/23/16 NSTEMI (non-ST elevated myocardial infarction) (HCC)  08/24/16 Status post coronary artery stent placement  Patient's Home Medications on Admission:  Current Outpatient Prescriptions:  .  amLODipine (NORVASC) 5 MG tablet, TAKE 1 TABLET BY MOUTH EVERY DAY, Disp: 30 tablet, Rfl: 3 .  aspirin EC 81 MG EC tablet, Take 1 tablet (81 mg total) by mouth daily., Disp: , Rfl:  .  cetirizine (ZYRTEC) 10 MG tablet, Take 1 tablet (10 mg total) by mouth at bedtime., Disp: 30 tablet, Rfl: 11 .  clopidogrel (PLAVIX) 75 MG tablet, Take 1 tablet (75 mg total) by mouth daily., Disp: 30 tablet, Rfl: 11 .  DESCOVY 200-25 MG tablet, TAKE 1 TABLET BY MOUTH ONCE DAILY, Disp: 30 tablet, Rfl: 5 .  methocarbamol (ROBAXIN) 500 MG tablet, Take 1 tablet by mouth as needed., Disp: , Rfl: 0 .  metoprolol tartrate (LOPRESSOR) 25 MG tablet, Take 0.5 tablets (12.5 mg total) by mouth 2 (two) times daily., Disp: 60 tablet, Rfl: 3 .  mometasone (NASONEX) 50 MCG/ACT nasal spray, Place 2 sprays into the nose daily. (Patient taking differently: Place 2 sprays into the nose daily as needed (allergies). ), Disp: 17 g, Rfl: 12 .  Multiple Vitamin (MULTIVITAMIN) tablet, Take 1 tablet by mouth daily., Disp: , Rfl:  .  nitroGLYCERIN (NITROSTAT) 0.4 MG SL tablet, Place 1 tablet (0.4 mg total) under the tongue every 5 (five) minutes x 3 doses as needed for chest pain., Disp: 25 tablet, Rfl: 3 .  oxyCODONE-acetaminophen (PERCOCET/ROXICET) 5-325 MG tablet,  Take 1 tablet by mouth daily as needed., Disp: , Rfl:  .  pravastatin (PRAVACHOL) 40 MG tablet, TAKE 1 TABLET BY MOUTH DAILY, Disp: 30 tablet, Rfl: 5 .  pravastatin (PRAVACHOL) 40 MG tablet, TAKE 1 TABLET BY MOUTH DAILY, Disp: 30 tablet, Rfl: 0 .  PREZCOBIX 800-150 MG tablet, TAKE 1 TABLET BY MOUTH DAILY, Disp: 30 tablet, Rfl: 5 .  Vitamin D, Ergocalciferol, (DRISDOL) 50000 UNITS CAPS capsule, Take 1 capsule (50,000 Units total) by mouth every 7 (seven) days., Disp: 8 capsule, Rfl: 0  Past Medical History: Past Medical History:  Diagnosis Date  . Anemia   . AVN (avascular necrosis of bone) (HCC)   . Benign paroxysmal positional vertigo 06/15/2015  . CAD (coronary artery disease), native coronary artery    a. 08/24/16 NSTEMI/PCI: LM nl, LAD 40p, D1 60ost, D2 80ost-small, LCX 9m (2.5x30 ONyx DES), OM1/2 min irregs, OM3 nl, RCA 30p/m, RPDA/RPAV/RPL1/RPL2/RPL3 nl, EF 55-65%.  . Childhood asthma   . GERD (gastroesophageal reflux disease)   . Glaucoma   . History of pneumonia    "4 times in my life" (08/24/2016)  . HIV infection (HCC) dx'd in the 1990s   "I've been undetectable 19 times" (08/24/2016)  . Hyperlipidemia   . Hypertension   . Memory impairment 06/15/2015  . Osteoporosis   . Primary localized osteoarthrosis of left hip 07/05/2016  . Seasonal allergies   . Tobacco abuse     Tobacco Use: History  Smoking Status  . Former Smoker  . Packs/day: 0.50  . Years: 47.00  . Types: Cigarettes  Smokeless Tobacco  . Never Used    Labs: Recent Review Flowsheet Data    Labs for ITP Cardiac and Pulmonary Rehab Latest Ref Rng & Units 02/04/2013 12/18/2013 04/30/2014 01/26/2015 08/24/2016   Cholestrol 0 - 200 mg/dL - [id number] [id number] 119134   LDLCALC 0 - 99 mg/dL - 79 87 147113 79   HDL >82>40 mg/dL - 95(A32(L) 21(H30(L) 08(M32(L) 57(Q30(L)   Trlycerides <150 mg/dL - 469(G238(H) 295127 284112 132127   Hemoglobin A1c 4.8 - 5.6 % 6.2(H) 6.3(H) - - 6.1(H)      Capillary Blood Glucose: No results found for: GLUCAP   Exercise Target  Goals:    Exercise Program Goal: Individual exercise prescription set with THRR, safety & activity barriers. Participant demonstrates ability to understand and report RPE using BORG scale, to self-measure pulse accurately, and to acknowledge the importance of the exercise prescription.  Exercise Prescription Goal: Starting with aerobic activity 30 plus minutes a day, 3 days per week for initial exercise prescription. Provide home exercise prescription and guidelines that participant acknowledges understanding prior to discharge.  Activity Barriers & Risk Stratification:     Activity Barriers & Cardiac Risk Stratification - 10/04/16 1345      Activity Barriers & Cardiac Risk Stratification   Activity Barriers Deconditioning;Muscular Weakness;Shortness of Breath   Cardiac Risk Stratification High      6 Minute Walk:     6 Minute Walk    Row Name 10/04/16 1552         6 Minute Walk   Phase Initial     Distance 1283 feet     Walk Time 6 minutes     # of Rest Breaks 0     MPH 2.43     METS 2.9     RPE 9     VO2 Peak 10.17     Symptoms No     Resting HR 56 bpm     Resting BP 108/60     Max Ex. HR 81 bpm     Max Ex. BP 132/70     2 Minute Post BP 124/72        Oxygen Initial Assessment:   Oxygen Re-Evaluation:   Oxygen Discharge (Final Oxygen Re-Evaluation):   Initial Exercise Prescription:     Initial Exercise Prescription - 10/04/16 1500      Date of Initial Exercise RX and Referring Provider   Date 10/04/16   Referring Provider Nicki GuadalajaraKelly, Thomas MD     Treadmill   MPH 2.5   Grade 0   Minutes 10   METs 2.91     Bike   Level 0.7   Minutes 10   METs 2.55     NuStep   Level 3   SPM 80   Minutes 10   METs 2.3     Prescription Details   Frequency (times per week) 3   Duration Progress to 30 minutes of continuous aerobic without signs/symptoms of physical distress     Intensity   THRR 40-80% of Max Heartrate 62-124   Ratings of Perceived  Exertion 11-13   Perceived Dyspnea 0-4     Progression   Progression Continue to progress workloads to maintain intensity without signs/symptoms of physical distress.     Resistance Training   Training Prescription Yes   Weight 3lbs   Reps 10-15      Perform Capillary Blood  Glucose checks as needed.  Exercise Prescription Changes:     Exercise Prescription Changes    Row Name 10/13/16 1200 10/28/16 1400 11/17/16 1300 12/02/16 1518 12/20/16 1500     Response to Exercise   Blood Pressure (Admit) 122/60 132/72 114/72 118/70 124/74   Blood Pressure (Exercise) 140/80 134/80 138/82 140/80 118/60   Blood Pressure (Exit) 108/60 106/62 122/72 114/60 118/68   Heart Rate (Admit) 66 bpm 59 bpm 59 bpm 65 bpm 57 bpm   Heart Rate (Exercise) 96 bpm 88 bpm 199 bpm 97 bpm 83 bpm   Heart Rate (Exit) 66 bpm 59 bpm 66 bpm 58 bpm 62 bpm   Rating of Perceived Exertion (Exercise) 12 13 12 14 13    Symptoms none  - none none none   Comments Pt oriented to exercise equipment on 10/12/16  -  -  -  -   Duration Continue with 30 min of aerobic exercise without signs/symptoms of physical distress. Continue with 30 min of aerobic exercise without signs/symptoms of physical distress. Continue with 30 min of aerobic exercise without signs/symptoms of physical distress. Continue with 30 min of aerobic exercise without signs/symptoms of physical distress. Continue with 30 min of aerobic exercise without signs/symptoms of physical distress.   Intensity THRR unchanged THRR unchanged THRR unchanged THRR unchanged THRR unchanged     Progression   Progression Continue to progress workloads to maintain intensity without signs/symptoms of physical distress. Continue to progress workloads to maintain intensity without signs/symptoms of physical distress. Continue to progress workloads to maintain intensity without signs/symptoms of physical distress. Continue to progress workloads to maintain intensity without signs/symptoms  of physical distress. Continue to progress workloads to maintain intensity without signs/symptoms of physical distress.   Average METs 2.3 3 3.4 3.5 3.8     Resistance Training   Training Prescription Yes Yes Yes Yes Yes   Weight 3lbs 3lbs 3lbs 3lbs 3lbs   Reps 10-15 10-15 10-15 10-15 10-15   Time 10 Minutes 10 Minutes 10 Minutes 10 Minutes 10 Minutes     Treadmill   MPH  - 2.6 2.6 2.7 2.7   Grade  - 2 2 3 3    Minutes  - 10 10 10 10    METs  - 3.71 3.71 4.19 4.19     Bike   Level 0.7 0.7 1.2 1.2 1.2   Minutes 10 10 10 10 10    METs 2.55 2.55 3.66 3.66 3.59     NuStep   Level 3 4 4 4 4    SPM 80 80 80 80 80   Minutes 10 10 10 10 10    METs 2.2 2.5 2.9 2.8 3.5     Track   Laps 8  -  -  -  -   Minutes 10  -  -  -  -   METs 2.39  -  -  -  -     Home Exercise Plan   Plans to continue exercise at  -  - Home (comment)  walk and go to [facility] (comment)  walking and going to [employer] (comment)  walk and go to [employer]  -  - Add 2 additional days to program exercise sessions. Add 2 additional days to program exercise sessions. Add 2 additional days to program exercise sessions.   Initial Home Exercises Provided  -  - 11/11/16 11/11/16 11/11/16      Exercise Comments:     Exercise Comments  Row Name 10/24/16 1102 12/20/16 1516         Exercise Comments Pt is tolerating exercise well; will continue to monitor activity levels and exercise progression. Pt is tolerating exercise well; will continue to monitor activity levels and exercise progression.         Exercise Goals and Review:     Exercise Goals    Row Name 10/04/16 1346 10/04/16 1353           Exercise Goals   Increase Physical Activity Yes  -      Intervention Provide advice, education, support and counseling about physical activity/exercise needs.;Develop an individualized exercise prescription for aerobic and resistive training based on initial evaluation findings, risk  stratification, comorbidities and participant's personal goals.  -      Expected Outcomes Achievement of increased cardiorespiratory fitness and enhanced flexibility, muscular endurance and strength shown through measurements of functional capacity and personal statement of participant.  -      Increase Strength and Stamina Yes -  learn activity limitations      Intervention Develop an individualized exercise prescription for aerobic and resistive training based on initial evaluation findings, risk stratification, comorbidities and participant's personal goals.;Provide advice, education, support and counseling about physical activity/exercise needs.  -      Expected Outcomes Achievement of increased cardiorespiratory fitness and enhanced flexibility, muscular endurance and strength shown through measurements of functional capacity and personal statement of participant.  -         Exercise Goals Re-Evaluation :     Exercise Goals Re-Evaluation    Row Name 10/24/16 1102 11/11/16 1522 12/20/16 1516 12/20/16 1521       Exercise Goal Re-Evaluation   Exercise Goals Review Increase Physical Activity;Increase Strenth and Stamina Increase Physical Activity;Increase Strenth and Stamina Increase Physical Activity;Increase Strenth and Stamina  -    Comments Pt is exercising for 30 minutes and actively participates in cardiac rehab Reviewed home exercise with pt today.  Pt plans to walk and/or go to [facility] for exercise.  Reviewed THR, pulse, RPE, sign and symptoms, NTG use, and when to call 911 or MD.  Also discussed weather considerations and indoor options.  Pt voiced understanding. Pt is consistent with HEP. Pt stated that he is walking 2x/week in addition to coming to cardiac rehab. Pt is consistent with HEP. Pt stated that he is walking 2x/week and going to [employer]  in addition to coming to cardiac rehab.    Expected Outcomes Pt will continue to build on aerobic capacity Pt will continue to build  on aerobic capacity Pt will continue to build on aerobic capacity and be compliant with HEP  -        Discharge Exercise Prescription (Final Exercise Prescription Changes):     Exercise Prescription Changes - 12/20/16 1500      Response to Exercise   Blood Pressure (Admit) 124/74   Blood Pressure (Exercise) 118/60   Blood Pressure (Exit) 118/68   Heart Rate (Admit) 57 bpm   Heart Rate (Exercise) 83 bpm   Heart Rate (Exit) 62 bpm   Rating of Perceived Exertion (Exercise) 13   Symptoms none   Duration Continue with 30 min of aerobic exercise without signs/symptoms of physical distress.   Intensity THRR unchanged     Progression   Progression Continue to progress workloads to maintain intensity without signs/symptoms of physical distress.   Average METs 3.8     Resistance Training   Training Prescription Yes  Weight 3lbs   Reps 10-15   Time 10 Minutes     Treadmill   MPH 2.7   Grade 3   Minutes 10   METs 4.19     Bike   Level 1.2   Minutes 10   METs 3.59     NuStep   Level 4   SPM 80   Minutes 10   METs 3.5     Home Exercise Plan   Plans to continue exercise at Home (comment)  walk and go to [employer] Add 2 additional days to program exercise sessions.   Initial Home Exercises Provided 11/11/16      Nutrition:  Target Goals: Understanding of nutrition guidelines, daily intake of sodium 1500mg , cholesterol 200mg , calories 30% from fat and 7% or less from saturated fats, daily to have 5 or more servings of fruits and vegetables.  Biometrics:     Pre Biometrics - 10/04/16 1557      Pre Biometrics   Waist Circumference 38.5 inches   Hip Circumference 40.5 inches   Waist to Hip Ratio 0.95 %   Triceps Skinfold 19 mm   % Body Fat 28.1 %   Grip Strength 40.5 kg   Flexibility 8 in   Single Leg Stand 20 seconds       Nutrition Therapy Plan and Nutrition Goals:     Nutrition Therapy & Goals - 11/16/16 1006      Nutrition Therapy    Diet Therapeutic Lifestyle Changes     Personal Nutrition Goals   Nutrition Goal Pt to identify food quantities necessary to achieve weight loss of 6-20 lb at graduation from cardiac rehab.    Personal Goal #2 Pt able to name foods that affect blood glucose      Intervention Plan   Intervention Prescribe, educate and counsel regarding individualized specific dietary modifications aiming towards targeted core components such as weight, hypertension, lipid management, diabetes, heart failure and other comorbidities.   Expected Outcomes Short Term Goal: Understand basic principles of dietary content, such as calories, fat, sodium, cholesterol and nutrients.;Long Term Goal: Adherence to prescribed nutrition plan.      Nutrition Discharge: Nutrition Scores:     Nutrition Assessments - 11/16/16 1006      MEDFICTS Scores   Pre Score 33      Nutrition Goals Re-Evaluation:   Nutrition Goals Re-Evaluation:   Nutrition Goals Discharge (Final Nutrition Goals Re-Evaluation):   Psychosocial: Target Goals: Acknowledge presence or absence of significant depression and/or stress, maximize coping skills, provide positive support system. Participant is able to verbalize types and ability to use techniques and skills needed for reducing stress and depression.  Initial Review & Psychosocial Screening:     Initial Psych Review & Screening - 10/04/16 1640      Initial Review   Current issues with None Identified     Family Dynamics   Good Support System? Yes  friends, family   Comments upon brief assessment, no psychosocial needs identified, no interventions necessary      Barriers   Psychosocial barriers to participate in program There are no identifiable barriers or psychosocial needs.     Screening Interventions   Interventions Encouraged to exercise;Provide feedback about the scores to participant      Quality of Life Scores:     Quality of Life - 10/19/16 1109      Quality  of Life Scores   Health/Function Pre 29.9 %   Socioeconomic Pre 22.3 %  Psych/Spiritual Pre 23.14 %   Family Pre 16 %  pt experience situational stress with his siblings and extended family. pt is learning to set healthy boundaries to avoid being taken advantage of and decrease stress/ anxiety.  this ia new behavior for handling situations that arise.    GLOBAL Pre 21.4 %  pt offered emotional support and reassurance. pt encouraged to continue establishing healthy boundaries to increase improve coping skills and reduce stress/anixiety.        PHQ-9: Recent Review Flowsheet Data    Depression screen [facility] 2/9 10/12/2016 09/09/2016 11/02/2015 06/15/2015 02/09/2015   Decreased Interest 0 0 0 0 0   Down, Depressed, Hopeless 0 0 0 0 0   PHQ - 2 Score 0 0 0 0 0     Interpretation of Total Score  Total Score Depression Severity:  1-4 = Minimal depression, 5-9 = Mild depression, 10-14 = Moderate depression, 15-19 = Moderately severe depression, 20-27 = Severe depression   Psychosocial Evaluation and Intervention:     Psychosocial Evaluation - 10/12/16 1015      Psychosocial Evaluation & Interventions   Interventions Encouraged to exercise with the program and follow exercise prescription   Comments no psychosocial needs identified, no interventions necessary.    Expected Outcomes pt will exhibit positive outlook with good coping skills.    Continue Psychosocial Services  No Follow up required      Psychosocial Re-Evaluation:     Psychosocial Re-Evaluation    Row Name 10/25/16 1539 11/16/16 1711 12/20/16 0820         Psychosocial Re-Evaluation   Current issues with None Identified None Identified None Identified     Comments no psychosocial needs identified, no interventions necessary  no psychosocial needs identified, no interventions necessary  no psychosocial needs identified, no interventions necessary      Expected Outcomes pt will exhibit positive outlook with good coping skills.   pt will exhibit positive outlook with good coping skills.  pt will exhibit positive outlook with good coping skills.      Interventions Encouraged to attend Cardiac Rehabilitation for the exercise;Stress management education;Relaxation education Encouraged to attend Cardiac Rehabilitation for the exercise;Stress management education;Relaxation education Encouraged to attend Cardiac Rehabilitation for the exercise;Stress management education;Relaxation education     Continue Psychosocial Services  No Follow up required No Follow up required No Follow up required        Psychosocial Discharge (Final Psychosocial Re-Evaluation):     Psychosocial Re-Evaluation - 12/20/16 0820      Psychosocial Re-Evaluation   Current issues with None Identified   Comments no psychosocial needs identified, no interventions necessary    Expected Outcomes pt will exhibit positive outlook with good coping skills.    Interventions Encouraged to attend Cardiac Rehabilitation for the exercise;Stress management education;Relaxation education   Continue Psychosocial Services  No Follow up required      Vocational Rehabilitation: Provide vocational rehab assistance to qualifying candidates.   Vocational Rehab Evaluation & Intervention:     Vocational Rehab - 10/19/16 1109      Initial Vocational Rehab Evaluation & Intervention   Assessment shows need for Vocational Rehabilitation Yes   Vocational Rehab Packet given to patient 10/19/16      Education: Education Goals: Education classes will be provided on a weekly basis, covering required topics. Participant will state understanding/return demonstration of topics presented.  Learning Barriers/Preferences:     Learning Barriers/Preferences - 10/04/16 1345      Learning Barriers/Preferences   Learning  Barriers Sight   Learning Preferences Written Material;Verbal Instruction;Skilled Demonstration      Education Topics: Count Your Pulse:  -Group  instruction provided by verbal instruction, demonstration, patient participation and written materials to support subject.  Instructors address importance of being able to find your pulse and how to count your pulse when at home without a heart monitor.  Patients get hands on experience counting their pulse with staff help and individually.   CARDIAC REHAB PHASE II EXERCISE from 12/21/2016 in [facility] CARDIAC REHAB  Date  11/11/16  Educator  RN  Instruction Review Code  2- meets goals/outcomes      Heart Attack, Angina, and Risk Factor Modification:  -Group instruction provided by verbal instruction, video, and written materials to support subject.  Instructors address signs and symptoms of angina and heart attacks.    Also discuss risk factors for heart disease and how to make changes to improve heart health risk factors.   CARDIAC REHAB PHASE II EXERCISE from 12/21/2016 in [facility] CARDIAC REHAB  Date  11/30/16  Instruction Review Code  2- meets goals/outcomes      Functional Fitness:  -Group instruction provided by verbal instruction, demonstration, patient participation, and written materials to support subject.  Instructors address safety measures for doing things around the house.  Discuss how to get up and down off the floor, how to pick things up properly, how to safely get out of a chair without assistance, and balance training.   CARDIAC REHAB PHASE II EXERCISE from 12/21/2016 in [facility] CARDIAC REHAB  Date  11/25/16  Instruction Review Code  2- meets goals/outcomes      Meditation and Mindfulness:  -Group instruction provided by verbal instruction, patient participation, and written materials to support subject.  Instructor addresses importance of mindfulness and meditation practice to help reduce stress and improve awareness.  Instructor also leads participants through a meditation exercise.    CARDIAC REHAB PHASE II  EXERCISE from 12/21/2016 in [facility] CARDIAC REHAB  Date  12/21/16  Instruction Review Code  2- meets goals/outcomes      Stretching for Flexibility and Mobility:  -Group instruction provided by verbal instruction, patient participation, and written materials to support subject.  Instructors lead participants through series of stretches that are designed to increase flexibility thus improving mobility.  These stretches are additional exercise for major muscle groups that are typically performed during regular warm up and cool down.   CARDIAC REHAB PHASE II EXERCISE from 12/21/2016 in [facility] CARDIAC REHAB  Date  12/02/16  Educator  EP  Instruction Review Code  R- Review/reinforce      Hands Only CPR:  -Group verbal, video, and participation provides a basic overview of AHA guidelines for community CPR. Role-play of emergencies allow participants the opportunity to practice calling for help and chest compression technique with discussion of AED use.   Hypertension: -Group verbal and written instruction that provides a basic overview of hypertension including the most recent diagnostic guidelines, risk factor reduction with self-care instructions and medication management.   CARDIAC REHAB PHASE II EXERCISE from 12/21/2016 in [facility] CARDIAC REHAB  Date  10/14/16  Instruction Review Code  2- meets goals/outcomes       Nutrition I class: Heart Healthy Eating:  -Group instruction provided by PowerPoint slides, verbal discussion, and written materials to support subject matter. The instructor gives an explanation and review of the Therapeutic Lifestyle Changes  diet recommendations, which includes a discussion on lipid goals, dietary fat, sodium, fiber, plant stanol/sterol esters, sugar, and the components of a well-balanced, healthy diet.   CARDIAC REHAB PHASE II EXERCISE from 12/21/2016 in [facility] CARDIAC REHAB   Date  11/16/16  Educator  RD  Instruction Review Code  Not applicable      Nutrition II class: Lifestyle Skills:  -Group instruction provided by PowerPoint slides, verbal discussion, and written materials to support subject matter. The instructor gives an explanation and review of label reading, grocery shopping for heart health, heart healthy recipe modifications, and ways to make healthier choices when eating out.   CARDIAC REHAB PHASE II EXERCISE from 12/21/2016 in [facility] CARDIAC REHAB  Date  11/16/16  Educator  RD  Instruction Review Code  Not applicable      Diabetes Question & Answer:  -Group instruction provided by PowerPoint slides, verbal discussion, and written materials to support subject matter. The instructor gives an explanation and review of diabetes co-morbidities, pre- and post-prandial blood glucose goals, pre-exercise blood glucose goals, signs, symptoms, and treatment of hypoglycemia and hyperglycemia, and foot care basics.   CARDIAC REHAB PHASE II EXERCISE from 12/21/2016 in [facility] CARDIAC REHAB  Date  12/09/16  Educator  RD  Instruction Review Code  2- meets goals/outcomes      Diabetes Blitz:  -Group instruction provided by PowerPoint slides, verbal discussion, and written materials to support subject matter. The instructor gives an explanation and review of the physiology behind type 1 and type 2 diabetes, diabetes medications and rational behind using different medications, pre- and post-prandial blood glucose recommendations and Hemoglobin A1c goals, diabetes diet, and exercise including blood glucose guidelines for exercising safely.    Portion Distortion:  -Group instruction provided by PowerPoint slides, verbal discussion, written materials, and food models to support subject matter. The instructor gives an explanation of serving size versus portion size, changes in portions sizes over the last 20 years, and what  consists of a serving from each food group.   CARDIAC REHAB PHASE II EXERCISE from 12/21/2016 in [facility] CARDIAC REHAB  Date  10/26/16  Educator  RD  Instruction Review Code  2- meets goals/outcomes      Stress Management:  -Group instruction provided by verbal instruction, video, and written materials to support subject matter.  Instructors review role of stress in heart disease and how to cope with stress positively.     Exercising on Your Own:  -Group instruction provided by verbal instruction, power point, and written materials to support subject.  Instructors discuss benefits of exercise, components of exercise, frequency and intensity of exercise, and end points for exercise.  Also discuss use of nitroglycerin and activating EMS.  Review options of places to exercise outside of rehab.  Review guidelines for sex with heart disease.   CARDIAC REHAB PHASE II EXERCISE from 12/21/2016 in [facility] CARDIAC REHAB  Date  11/23/16  Instruction Review Code  2- meets goals/outcomes      Cardiac Drugs I:  -Group instruction provided by verbal instruction and written materials to support subject.  Instructor reviews cardiac drug classes: antiplatelets, anticoagulants, beta blockers, and statins.  Instructor discusses reasons, side effects, and lifestyle considerations for each drug class.   CARDIAC REHAB PHASE II EXERCISE from 12/21/2016 in [facility] CARDIAC REHAB  Date  12/16/16  Educator  Annice Pih  Instruction Review Code  2- meets goals/outcomes  Cardiac Drugs II:  -Group instruction provided by verbal instruction and written materials to support subject.  Instructor reviews cardiac drug classes: angiotensin converting enzyme inhibitors (ACE-I), angiotensin II receptor blockers (ARBs), nitrates, and calcium channel blockers.  Instructor discusses reasons, side effects, and lifestyle considerations for each drug class.   CARDIAC  REHAB PHASE II EXERCISE from 12/21/2016 in [facility] CARDIAC REHAB  Date  11/16/16  Educator  Zena Amos  Instruction Review Code  2- meets goals/outcomes      Anatomy and Physiology of the Circulatory System:  Group verbal and written instruction and models provide basic cardiac anatomy and physiology, with the coronary electrical and arterial systems. Review of: AMI, Angina, Valve disease, Heart Failure, Peripheral Artery Disease, Cardiac Arrhythmia, Pacemakers, and the ICD.   CARDIAC REHAB PHASE II EXERCISE from 12/21/2016 in [facility] CARDIAC REHAB  Date  12/07/16  Instruction Review Code  2- meets goals/outcomes      Other Education:  -Group or individual verbal, written, or video instructions that support the educational goals of the cardiac rehab program.   Knowledge Questionnaire Score:     Knowledge Questionnaire Score - 10/04/16 0945      Knowledge Questionnaire Score   Pre Score 19/24      Core Components/Risk Factors/Patient Goals at Admission:     Personal Goals and Risk Factors at Admission - 10/04/16 1558      Core Components/Risk Factors/Patient Goals on Admission   Hypertension Yes   Intervention Monitor prescription use compliance.;Provide education on lifestyle modifcations including regular physical activity/exercise, weight management, moderate sodium restriction and increased consumption of fresh fruit, vegetables, and low fat dairy, alcohol moderation, and smoking cessation.   Expected Outcomes Short Term: Continued assessment and intervention until BP is < 140/85mm HG in hypertensive participants. < 130/76mm HG in hypertensive participants with diabetes, heart failure or chronic kidney disease.;Long Term: Maintenance of blood pressure at goal levels.   Lipids Yes   Intervention Provide education and support for participant on nutrition & aerobic/resistive exercise along with prescribed medications to achieve LDL 70mg ,  HDL >40mg .   Expected Outcomes Short Term: Participant states understanding of desired cholesterol values and is compliant with medications prescribed. Participant is following exercise prescription and nutrition guidelines.;Long Term: Cholesterol controlled with medications as prescribed, with individualized exercise RX and with personalized nutrition plan. Value goals: LDL < 70mg , HDL > 40 mg.      Core Components/Risk Factors/Patient Goals Review:      Goals and Risk Factor Review    Row Name 10/25/16 1537 11/16/16 1711 12/20/16 0820         Core Components/Risk Factors/Patient Goals Review   Personal Goals Review Hypertension;Lipids;Other Hypertension;Lipids;Other Hypertension;Lipids;Other     Review pt with desire to reduce risk factors exhibits eagerness to participate in CR activities.  pt with desire to reduce risk factors exhibits eagerness to participate in CR activities.  pt with desire to reduce risk factors exhibits eagerness to participate in CR activities.      Expected Outcomes pt will participate in CR exercise, nutrition and lifestyle modification education to reduce overall risk factors.  pt will participate in CR exercise, nutrition and lifestyle modification education to reduce overall risk factors.  pt will participate in CR exercise, nutrition and lifestyle modification education to reduce overall risk factors.         Core Components/Risk Factors/Patient Goals at Discharge (Final Review):      Goals and Risk Factor Review - 12/20/16  0820      Core Components/Risk Factors/Patient Goals Review   Personal Goals Review Hypertension;Lipids;Other   Review pt with desire to reduce risk factors exhibits eagerness to participate in CR activities.    Expected Outcomes pt will participate in CR exercise, nutrition and lifestyle modification education to reduce overall risk factors.       ITP Comments:     ITP Comments    Row Name 10/04/16 1342 10/25/16 1536 11/22/16  1607 12/22/16 0912     ITP Comments Medical Director, Dr. Armanda Magic Medical Director, Dr. Armanda Magic Medical Director, Dr. Armanda Magic Medical Director, Dr. Armanda Magic       Comments: Pt is making expected progress toward personal goals after completing 18 sessions. Recommend continued exercise and life style modification education including  stress management and relaxation techniques to decrease cardiac risk profile.

## 2016-12-23 ENCOUNTER — Encounter (HOSPITAL_COMMUNITY)
Admission: RE | Admit: 2016-12-23 | Discharge: 2016-12-23 | Disposition: A | Discharge status: Home / Self Care | Payer: Medicare Other | Source: Ambulatory Visit | Attending: Cardiovascular Disease | Admitting: Cardiovascular Disease

## 2016-12-23 DIAGNOSIS — Z955 Presence of coronary angioplasty implant and graft: Secondary | ICD-10-CM

## 2016-12-23 DIAGNOSIS — I214 Non-ST elevation (NSTEMI) myocardial infarction: Secondary | ICD-10-CM

## 2016-12-26 ENCOUNTER — Encounter (HOSPITAL_COMMUNITY): Payer: Medicare Other

## 2016-12-28 ENCOUNTER — Encounter (HOSPITAL_COMMUNITY)
Admission: RE | Admit: 2016-12-28 | Discharge: 2016-12-28 | Disposition: A | Discharge status: Home / Self Care | Payer: Medicare Other | Source: Ambulatory Visit | Attending: Cardiovascular Disease | Admitting: Cardiovascular Disease

## 2016-12-28 DIAGNOSIS — Z955 Presence of coronary angioplasty implant and graft: Secondary | ICD-10-CM

## 2016-12-28 DIAGNOSIS — I214 Non-ST elevation (NSTEMI) myocardial infarction: Secondary | ICD-10-CM

## 2016-12-29 ENCOUNTER — Other Ambulatory Visit: Payer: Medicare Other

## 2016-12-30 ENCOUNTER — Encounter (HOSPITAL_COMMUNITY): Payer: Medicare Other

## 2017-01-02 ENCOUNTER — Encounter (HOSPITAL_COMMUNITY): Payer: Medicare Other

## 2017-01-04 ENCOUNTER — Encounter: Payer: Self-pay | Admitting: Infectious Disease

## 2017-01-04 ENCOUNTER — Encounter (HOSPITAL_COMMUNITY)
Admission: RE | Admit: 2017-01-04 | Discharge: 2017-01-04 | Disposition: A | Discharge status: Home / Self Care | Payer: Medicare Other | Source: Ambulatory Visit | Attending: Cardiovascular Disease | Admitting: Cardiovascular Disease

## 2017-01-04 ENCOUNTER — Telehealth (HOSPITAL_COMMUNITY): Payer: Self-pay | Admitting: *Deleted

## 2017-01-06 ENCOUNTER — Encounter (HOSPITAL_COMMUNITY): Payer: Medicare Other

## 2017-01-10 NOTE — Addendum Note (Signed)
Encounter addended by: Warrick ParisianFair, Linsey Hirota D on: 01/10/2017  3:15 PM<BR>    Actions taken: Flowsheet accepted

## 2017-01-10 NOTE — Addendum Note (Signed)
Encounter addended by: Warrick ParisianFair, Ninoska Goswick D on: 01/10/2017  3:12 PM<BR>    Actions taken: Flowsheet data copied forward, Flowsheet accepted

## 2017-01-10 NOTE — Addendum Note (Signed)
Encounter addended by: Nykia Turko D on: 01/10/2017  3:15 PM<BR>    Actions taken: Flowsheet accepted

## 2017-01-11 ENCOUNTER — Ambulatory Visit (INDEPENDENT_AMBULATORY_CARE_PROVIDER_SITE_OTHER): Payer: Medicare Other | Admitting: Infectious Disease

## 2017-01-11 ENCOUNTER — Encounter: Payer: Self-pay | Admitting: Infectious Disease

## 2017-01-11 ENCOUNTER — Encounter (HOSPITAL_COMMUNITY): Payer: Medicare Other

## 2017-01-11 VITALS — BP 121/68 | HR 55 | Temp 97.4°F | Wt 192.0 lb

## 2017-01-11 DIAGNOSIS — F17200 Nicotine dependence, unspecified, uncomplicated: Secondary | ICD-10-CM

## 2017-01-11 DIAGNOSIS — B20 Human immunodeficiency virus [HIV] disease: Secondary | ICD-10-CM | POA: Diagnosis not present

## 2017-01-11 DIAGNOSIS — Z79899 Other long term (current) drug therapy: Secondary | ICD-10-CM | POA: Diagnosis not present

## 2017-01-11 DIAGNOSIS — I10 Essential (primary) hypertension: Secondary | ICD-10-CM

## 2017-01-11 DIAGNOSIS — I214 Non-ST elevation (NSTEMI) myocardial infarction: Secondary | ICD-10-CM

## 2017-01-11 DIAGNOSIS — M8700 Idiopathic aseptic necrosis of unspecified bone: Secondary | ICD-10-CM | POA: Diagnosis not present

## 2017-01-11 DIAGNOSIS — I2510 Atherosclerotic heart disease of native coronary artery without angina pectoris: Secondary | ICD-10-CM | POA: Insufficient documentation

## 2017-01-11 DIAGNOSIS — Z113 Encounter for screening for infections with a predominantly sexual mode of transmission: Secondary | ICD-10-CM

## 2017-01-11 NOTE — Progress Notes (Signed)
Chief complaint: followup for HIV on medications, c/o weight gain and difficulty with exercise   Subjective:    Patient ID: Wayne Santos, male    DOB: 09-19-1950, 66 y.o.   MRN: [id number]  HPI  Wayne Santos is a 66 y.o. male who is doing superbly well on his  antiviral regimen, Prezcobix and Descovy with undetectable viral load and health cd4 count.   Unfortunately he has had comorbid HTN, hyperlipidemia and  smoking that I could never convince him to stop and ultimately he did develop a non STEMI. He underwent cardiac catheterization by Dr.  Sink and was found to have a 95% mid circumflex stenosis which was successfully stented with a 2.530 mm Resolute onyx DES stent.  He had mild concomitant CAD with 30% proximal and mid RCA stenoses, 60% diagonal 1, 80% diagonal 2 stenoses and small vessels with mild 40% proximal LAD narrowing, irregularity in the circumflex marginal branches  He has now had bilateral THA for AVN.  He has stopped smoking altogether but is now upset about weight gain and difficulty with exercise that he attributes to weight gain and beta blocker.    Lab Results  Component Value Date   HIV1RNAQUANT <20 05/12/2016   HIV1RNAQUANT <20 06/01/2015   HIV1RNAQUANT <20 01/26/2015   Lab Results  Component Value Date   CD4TABS 1,010 05/12/2016   CD4TABS 850 06/01/2015   CD4TABS 810 01/26/2015      Past Medical History:  Diagnosis Date  . Anemia   . AVN (avascular necrosis of bone) (HCC)   . Benign paroxysmal positional vertigo 06/15/2015  . CAD (coronary artery disease), native coronary artery    a. 08/24/16 NSTEMI/PCI: LM nl, LAD 40p, D1 60ost, D2 80ost-small, LCX 64m (2.5x30 ONyx DES), OM1/2 min irregs, OM3 nl, RCA 30p/m, RPDA/RPAV/RPL1/RPL2/RPL3 nl, EF 55-65%.  . Childhood asthma   . GERD (gastroesophageal reflux disease)   . Glaucoma   . History of pneumonia    "4 times in my life" (08/24/2016)  . HIV infection (HCC) dx'd in the 1990s   "I've been  undetectable 19 times" (08/24/2016)  . Hyperlipidemia   . Hypertension   . Memory impairment 06/15/2015  . Osteoporosis   . Primary localized osteoarthrosis of left hip 07/05/2016  . Seasonal allergies   . Tobacco abuse     Past Surgical History:  Procedure Laterality Date  . CARDIAC CATHETERIZATION    . CORONARY ANGIOPLASTY WITH STENT PLACEMENT  08/24/2016  . CORONARY STENT INTERVENTION N/A 08/24/2016   Procedure: Coronary Stent Intervention;  Surgeon: Iran Ouch, MD;  Location: [facility] INVASIVE CV LAB;  Service: Cardiovascular;  Laterality: N/A;  . HAND NERVE REPAIR Right 1990s?   "caught it in a machine; had to be reconstructed"  . JOINT REPLACEMENT    . LEFT HEART CATH AND CORONARY ANGIOGRAPHY N/A 08/24/2016   Procedure: Left Heart Cath and Coronary Angiography;  Surgeon: Iran Ouch, MD;  Location: [facility] INVASIVE CV LAB;  Service: Cardiovascular;  Laterality: N/A;  . TOTAL HIP ARTHROPLASTY Right   . TOTAL HIP ARTHROPLASTY Left 07/05/2016   Procedure: TOTAL HIP ARTHROPLASTY ANTERIOR APPROACH;  Surgeon: Marcene Corning, MD;  Location: [facility] OR;  Service: Orthopedics;  Laterality: Left;    No family history on file.    Social History   Social History  . Marital status: Single    Spouse name: N/A  . Number of children: N/A  . Years of education: N/A   Social History Main Topics  . Smoking  status: Former Smoker    Packs/day: 0.50    Years: 47.00    Types: Cigarettes  . Smokeless tobacco: Never Used  . Alcohol use 0.0 oz/week     Comment: 08/24/2016 "I'll have a couple drinks maybe 4 times/year"  . Drug use: Yes    Types: Marijuana     Comment: 08/24/2016 "nothing since my younger days"  . Sexual activity: Yes     Comment: declined condoms   Other Topics Concern  . None   Social History Narrative  . None    Allergies  Allergen Reactions  . Ace Inhibitors Cough     Current Outpatient Prescriptions:  .  amLODipine (NORVASC) 5 MG tablet, TAKE 1 TABLET BY MOUTH EVERY  DAY, Disp: 30 tablet, Rfl: 3 .  aspirin EC 81 MG EC tablet, Take 1 tablet (81 mg total) by mouth daily., Disp: , Rfl:  .  cetirizine (ZYRTEC) 10 MG tablet, Take 1 tablet (10 mg total) by mouth at bedtime., Disp: 30 tablet, Rfl: 11 .  clopidogrel (PLAVIX) 75 MG tablet, Take 1 tablet (75 mg total) by mouth daily., Disp: 30 tablet, Rfl: 11 .  DESCOVY 200-25 MG tablet, TAKE 1 TABLET BY MOUTH ONCE DAILY, Disp: 30 tablet, Rfl: 5 .  methocarbamol (ROBAXIN) 500 MG tablet, Take 1 tablet by mouth as needed., Disp: , Rfl: 0 .  metoprolol tartrate (LOPRESSOR) 25 MG tablet, Take 0.5 tablets (12.5 mg total) by mouth 2 (two) times daily., Disp: 60 tablet, Rfl: 3 .  mometasone (NASONEX) 50 MCG/ACT nasal spray, Place 2 sprays into the nose daily. (Patient taking differently: Place 2 sprays into the nose daily as needed (allergies). ), Disp: 17 g, Rfl: 12 .  Multiple Vitamin (MULTIVITAMIN) tablet, Take 1 tablet by mouth daily., Disp: , Rfl:  .  nitroGLYCERIN (NITROSTAT) 0.4 MG SL tablet, Place 1 tablet (0.4 mg total) under the tongue every 5 (five) minutes x 3 doses as needed for chest pain., Disp: 25 tablet, Rfl: 3 .  oxyCODONE-acetaminophen (PERCOCET/ROXICET) 5-325 MG tablet, Take 1 tablet by mouth daily as needed., Disp: , Rfl:  .  pravastatin (PRAVACHOL) 40 MG tablet, TAKE 1 TABLET BY MOUTH DAILY, Disp: 30 tablet, Rfl: 5 .  PREZCOBIX 800-150 MG tablet, TAKE 1 TABLET BY MOUTH DAILY, Disp: 30 tablet, Rfl: 5 .  Vitamin D, Ergocalciferol, (DRISDOL) 50000 UNITS CAPS capsule, Take 1 capsule (50,000 Units total) by mouth every 7 (seven) days., Disp: 8 capsule, Rfl: 0     Review of Systems  Constitutional: Positive for unexpected weight change. Negative for activity change, appetite change, chills, diaphoresis, fatigue and fever.  HENT: Negative for congestion, rhinorrhea, sinus pressure, sneezing, sore throat and trouble swallowing.   Eyes: Negative for photophobia and visual disturbance.  Respiratory: Positive for  shortness of breath. Negative for cough, chest tightness, wheezing and stridor.   Cardiovascular: Negative for chest pain, palpitations and leg swelling.  Gastrointestinal: Negative for abdominal distention, abdominal pain, anal bleeding, blood in stool, constipation, diarrhea, nausea and vomiting.  Genitourinary: Negative for difficulty urinating, dysuria, flank pain and hematuria.  Musculoskeletal: Negative for arthralgias, back pain, gait problem, joint swelling and myalgias.  Skin: Negative for color change, pallor, rash and wound.  Neurological: Positive for dizziness. Negative for tremors, weakness and light-headedness.  Hematological: Negative for adenopathy. Does not bruise/bleed easily.  Psychiatric/Behavioral: Negative for agitation, behavioral problems, confusion, decreased concentration, dysphoric mood and sleep disturbance.       Objective:   Physical Exam  Constitutional: He is  oriented to person, place, and time. He appears well-developed and well-nourished.  HENT:  Head: Normocephalic and atraumatic.  Eyes: Conjunctivae and EOM are normal.  Neck: Normal range of motion. Neck supple.  Cardiovascular: Normal rate and regular rhythm.   Pulmonary/Chest: Effort normal. No respiratory distress. He has no wheezes.  Abdominal: Soft. He exhibits no distension.  Musculoskeletal: Normal range of motion. He exhibits no edema or tenderness.  Neurological: He is alert and oriented to person, place, and time.  Skin: Skin is warm and dry. No rash noted. No erythema. No pallor.  Psychiatric: He has a normal mood and affect. His behavior is normal. Judgment and thought content normal.  Nursing note and vitals reviewed.         Assessment & Plan:  HIV disease: Continue Prezcobix and Descovy--> [facility]  He has largely been on PI based therapy and I discerned in cursive handwritten not by Dr. Roxan Hockeyobinson "salvage therapy" when the patient was on Kaletra and Truvada.   I  DONT think he has ever been on ANY INSTI so that is an option and he may still have virus that would be S to RPV, ETR  I would consider seeing if Monogram have prior genotypes on him and doing an Museum/gallery exhibitions officerGenosure ARchive to see if we could get him to a non DRV based regimen given some concern re DRV and MI risk  I would obviously pick a "high barrier to R INSTI such as BIC or DTG  CAD: needs to continue plavix, asa, beta blocker, cach blocker cardiac rehab  Weight gain: recommended cutting carbs and calories but he then went on tangent about how bad diet drinks were. I asked him not to let "perfect be enemy of good" here. He claims to be seeing nutrition  HTN: continue norvasc  And beta blocker  AVN: sp bilateral THA  Smoking has finally stopped  I spent greater than 40  minutes with the patient including greater than 50% of time in face to face counsel of the patient re his HIV, CAD<  HTN, AVN, prior smoking weight gain  and and in coordination of his care.

## 2017-01-13 ENCOUNTER — Encounter (HOSPITAL_COMMUNITY): Payer: Medicare Other

## 2017-01-13 LAB — T-HELPER CELL (CD4) - (RCID CLINIC ONLY)
CD4 % Helper T Cell: 31 % — ABNORMAL LOW (ref 33–55)
CD4 T Cell Abs: 970 /uL (ref 400–2700)

## 2017-01-14 LAB — HIV-1 RNA ULTRAQUANT REFLEX TO GENTYP+: HIV-1 RNA Quant, Log: <1.3 Log cps/mL — ABNORMAL HIGH

## 2017-01-17 NOTE — Addendum Note (Signed)
Encounter addended by: Warrick ParisianFair, Elizabelle Fite D on: 01/17/2017  3:58 PM<BR>    Actions taken: Visit Navigator Flowsheet section accepted

## 2017-01-31 ENCOUNTER — Telehealth (HOSPITAL_COMMUNITY): Payer: Self-pay | Admitting: Cardiac Rehabilitation

## 2017-01-31 NOTE — Telephone Encounter (Signed)
pc received from pt.  He is feeling better from sinus infection and is interested in resuming cardiac rehab.pt advised most appropriate recommendation is to exit program and restart with next available orientation to complete 17 available sessions or pt could have walk test this week and graduate with 20 sessions. Pt undecided and will call back to schedule.

## 2017-02-03 ENCOUNTER — Other Ambulatory Visit: Payer: Self-pay | Admitting: Infectious Disease

## 2017-02-03 DIAGNOSIS — I10 Essential (primary) hypertension: Secondary | ICD-10-CM

## 2017-02-07 NOTE — Addendum Note (Signed)
Encounter addended by: Robyne Peers, RN on: 02/07/2017 10:40 AM<BR>    Actions taken: Flowsheet data copied forward, Visit Navigator Flowsheet section accepted

## 2017-02-15 NOTE — Addendum Note (Signed)
Encounter addended by: Warrick Parisian D on: 02/15/2017  4:39 PM<BR>    Actions taken: Visit Navigator Flowsheet section accepted

## 2017-02-17 NOTE — Addendum Note (Signed)
Encounter addended by: Jacques Earthly, RD on: 02/17/2017 11:50 AM<BR>    Actions taken: Flowsheet data copied forward, Visit Navigator Flowsheet section accepted

## 2017-02-28 NOTE — Addendum Note (Signed)
Encounter addended by: Robyne Peersion, Joann H, RN on: 02/28/2017  7:32 AM<BR>    Actions taken: Visit Navigator Flowsheet section accepted, Sign clinical note, Episode resolved

## 2017-02-28 NOTE — Progress Notes (Signed)
Discharge Progress Report  Patient Details  Name: Wayne Santos MRN: [id number] Date of Birth: Sep 29, 1950 Referring Provider:     CARDIAC REHAB PHASE II ORIENTATION from 10/04/2016 in [facility] CARDIAC [facility]  Referring Provider  Nicki Guadalajara MD       Number of Visits: 19   Reason for Discharge:  Patient reached a stable level of exercise.  Pt exited program early.  Smoking History:  History  Smoking Status  . Former Smoker  . Packs/day: 0.50  . Years: 47.00  . Types: Cigarettes  Smokeless Tobacco  . Never Used    Diagnosis:  08/23/16 NSTEMI (non-ST elevated myocardial infarction) (HCC)  08/24/16 Status post coronary artery stent placement  ADL UCSD:   Initial Exercise Prescription:     Initial Exercise Prescription - 10/04/16 1500      Date of Initial Exercise RX and Referring Provider   Date 10/04/16   Referring Provider Nicki Guadalajara MD     Treadmill   MPH 2.5   Grade 0   Minutes 10   METs 2.91     Bike   Level 0.7   Minutes 10   METs 2.55     NuStep   Level 3   SPM 80   Minutes 10   METs 2.3     Prescription Details   Frequency (times per week) 3   Duration Progress to 30 minutes of continuous aerobic without signs/symptoms of physical distress     Intensity   THRR 40-80% of Max Heartrate 62-124   Ratings of Perceived Exertion 11-13   Perceived Dyspnea 0-4     Progression   Progression Continue to progress workloads to maintain intensity without signs/symptoms of physical distress.     Resistance Training   Training Prescription Yes   Weight 3lbs   Reps 10-15      Discharge Exercise Prescription (Final Exercise Prescription Changes):     Exercise Prescription Changes - 01/10/17 1500      Treadmill   MPH --   Grade --   Minutes --   METs --     Bike   Level --   Minutes --   METs --     NuStep   Level --   SPM --   Minutes --   METs --     Home Exercise Plan   Plans to continue exercise at --    Frequency --   Initial Home Exercises Provided --      Functional Capacity:     6 Minute Walk    Row Name 10/04/16 1552         6 Minute Walk   Phase Initial     Distance 1283 feet     Walk Time 6 minutes     # of Rest Breaks 0     MPH 2.43     METS 2.9     RPE 9     VO2 Peak 10.17     Symptoms No     Resting HR 56 bpm     Resting BP 108/60     Max Ex. HR 81 bpm     Max Ex. BP 132/70     2 Minute Post BP 124/72        Psychological, QOL, Others - Outcomes: PHQ 2/9: Depression screen [facility] 2/9 01/11/2017 10/12/2016 09/09/2016 11/02/2015 06/15/2015  Decreased Interest 0 0 0 0 0  Down, Depressed, Hopeless 0 0 0 0 0  PHQ -  2 Score 0 0 0 0 0    Quality of Life:     Quality of Life - 10/19/16 1109      Quality of Life Scores   Health/Function Pre 29.9 %   Socioeconomic Pre 22.3 %   Psych/Spiritual Pre 23.14 %   Family Pre 16 %  pt experience situational stress with his siblings and extended family. pt is learning to set healthy boundaries to avoid being taken advantage of and decrease stress/ anxiety.  this ia new behavior for handling situations that arise.    GLOBAL Pre 21.4 %  pt offered emotional support and reassurance. pt encouraged to continue establishing healthy boundaries to increase improve coping skills and reduce stress/anixiety.        Personal Goals: Goals established at orientation with interventions provided to work toward goal.     Personal Goals and Risk Factors at Admission - 10/04/16 1558      Core Components/Risk Factors/Patient Goals on Admission   Hypertension Yes   Intervention Monitor prescription use compliance.;Provide education on lifestyle modifcations including regular physical activity/exercise, weight management, moderate sodium restriction and increased consumption of fresh fruit, vegetables, and low fat dairy, alcohol moderation, and smoking cessation.   Expected Outcomes Short Term: Continued assessment and intervention until BP is  < 140/4mm HG in hypertensive participants. < 130/76mm HG in hypertensive participants with diabetes, heart failure or chronic kidney disease.;Long Term: Maintenance of blood pressure at goal levels.   Lipids Yes   Intervention Provide education and support for participant on nutrition & aerobic/resistive exercise along with prescribed medications to achieve LDL 70mg , HDL >40mg .   Expected Outcomes Short Term: Participant states understanding of desired cholesterol values and is compliant with medications prescribed. Participant is following exercise prescription and nutrition guidelines.;Long Term: Cholesterol controlled with medications as prescribed, with individualized exercise RX and with personalized nutrition plan. Value goals: LDL < 70mg , HDL > 40 mg.       Personal Goals Discharge:     Goals and Risk Factor Review    Row Name 10/25/16 1537 11/16/16 1711 12/20/16 0820 01/03/17 1524 02/07/17 1032     Core Components/Risk Factors/Patient Goals Review   Personal Goals Review Hypertension;Lipids;Other Hypertension;Lipids;Other Hypertension;Lipids;Other Hypertension;Lipids;Other Hypertension;Lipids;Other   Review pt with desire to reduce risk factors exhibits eagerness to participate in CR activities.  pt with desire to reduce risk factors exhibits eagerness to participate in CR activities.  pt with desire to reduce risk factors exhibits eagerness to participate in CR activities.  pt with desire to reduce risk factors exhibits eagerness to participate in CR activities.  pt exited program early after extended absence for sinus congestion. pt plans to exercise on his own.    Expected Outcomes pt will participate in CR exercise, nutrition and lifestyle modification education to reduce overall risk factors.  pt will participate in CR exercise, nutrition and lifestyle modification education to reduce overall risk factors.  pt will participate in CR exercise, nutrition and lifestyle modification  education to reduce overall risk factors.  pt will participate in CR exercise, nutrition and lifestyle modification education to reduce overall risk factors.  pt will continue exercise, nutrition and lifestyle modification to reduce overall risk factors.       Exercise Goals and Review:     Exercise Goals    Row Name 10/04/16 1346 10/04/16 1353           Exercise Goals   Increase Physical Activity Yes  -      Intervention  Provide advice, education, support and counseling about physical activity/exercise needs.;Develop an individualized exercise prescription for aerobic and resistive training based on initial evaluation findings, risk stratification, comorbidities and participant's personal goals.  -      Expected Outcomes Achievement of increased cardiorespiratory fitness and enhanced flexibility, muscular endurance and strength shown through measurements of functional capacity and personal statement of participant.  -      Increase Strength and Stamina Yes -  learn activity limitations      Intervention Develop an individualized exercise prescription for aerobic and resistive training based on initial evaluation findings, risk stratification, comorbidities and participant's personal goals.;Provide advice, education, support and counseling about physical activity/exercise needs.  -      Expected Outcomes Achievement of increased cardiorespiratory fitness and enhanced flexibility, muscular endurance and strength shown through measurements of functional capacity and personal statement of participant.  -         Nutrition & Weight - Outcomes:     Pre Biometrics - 10/04/16 1557      Pre Biometrics   Waist Circumference 38.5 inches   Hip Circumference 40.5 inches   Waist to Hip Ratio 0.95 %   Triceps Skinfold 19 mm   % Body Fat 28.1 %   Grip Strength 40.5 kg   Flexibility 8 in   Single Leg Stand 20 seconds         Post Biometrics - 02/15/17 1627       Post  Biometrics   Weight  189 lb 10.2 oz (86 kg)      Nutrition:     Nutrition Therapy & Goals - 11/16/16 1006      Nutrition Therapy   Diet Therapeutic Lifestyle Changes     Personal Nutrition Goals   Nutrition Goal Pt to identify food quantities necessary to achieve weight loss of 6-20 lb at graduation from cardiac rehab.    Personal Goal #2 Pt able to name foods that affect blood glucose      Intervention Plan   Intervention Prescribe, educate and counsel regarding individualized specific dietary modifications aiming towards targeted core components such as weight, hypertension, lipid management, diabetes, heart failure and other comorbidities.   Expected Outcomes Short Term Goal: Understand basic principles of dietary content, such as calories, fat, sodium, cholesterol and nutrients.;Long Term Goal: Adherence to prescribed nutrition plan.      Nutrition Discharge:     Nutrition Assessments - 11/16/16 1006      MEDFICTS Scores   Pre Score 33      Education Questionnaire Score:     Knowledge Questionnaire Score - 10/04/16 0945      Knowledge Questionnaire Score   Pre Score 19/24      Goals reviewed with patient; copy given to patient.

## 2017-03-22 ENCOUNTER — Encounter (HOSPITAL_COMMUNITY): Payer: Self-pay | Admitting: Emergency Medicine

## 2017-03-22 ENCOUNTER — Other Ambulatory Visit: Payer: Self-pay

## 2017-03-22 ENCOUNTER — Ambulatory Visit (INDEPENDENT_AMBULATORY_CARE_PROVIDER_SITE_OTHER): Payer: Medicare Other

## 2017-03-22 ENCOUNTER — Ambulatory Visit (HOSPITAL_COMMUNITY)
Admission: EM | Admit: 2017-03-22 | Discharge: 2017-03-22 | Disposition: A | Discharge status: Home / Self Care | Payer: Medicare Other | Attending: Internal Medicine | Admitting: Internal Medicine

## 2017-03-22 DIAGNOSIS — H16001 Unspecified corneal ulcer, right eye: Secondary | ICD-10-CM

## 2017-03-22 DIAGNOSIS — T1591XA Foreign body on external eye, part unspecified, right eye, initial encounter: Secondary | ICD-10-CM

## 2017-03-22 DIAGNOSIS — J209 Acute bronchitis, unspecified: Secondary | ICD-10-CM

## 2017-03-22 DIAGNOSIS — J019 Acute sinusitis, unspecified: Secondary | ICD-10-CM

## 2017-03-22 DIAGNOSIS — J9801 Acute bronchospasm: Secondary | ICD-10-CM

## 2017-03-22 MED ORDER — TETRACAINE HCL 0.5 % OP SOLN
OPHTHALMIC | Status: AC
  Filled 2017-03-22: qty 4

## 2017-03-22 MED ORDER — METHYLPREDNISOLONE SODIUM SUCC 125 MG IJ SOLR
INTRAMUSCULAR | Status: AC
  Filled 2017-03-22: qty 2

## 2017-03-22 MED ORDER — MOXIFLOXACIN HCL 0.5 % OP SOLN: 1.0000 [drp] | Freq: Four times a day (QID) | OPHTHALMIC | 0 refills | Status: DC

## 2017-03-22 MED ORDER — EYE WASH OPHTH SOLN
OPHTHALMIC | Status: AC
Start: 2017-03-22 — End: 2017-03-22
  Filled 2017-03-22: qty 118

## 2017-03-22 MED ORDER — METHYLPREDNISOLONE SODIUM SUCC 125 MG IJ SOLR
80.0000 mg | Freq: Once | INTRAMUSCULAR | Status: AC
  Administered 2017-03-22: 80 mg via INTRAMUSCULAR

## 2017-03-22 MED ORDER — AMOXICILLIN-POT CLAVULANATE 875-125 MG PO TABS: 1.0000 | ORAL_TABLET | Freq: Two times a day (BID) | ORAL | 0 refills | Status: AC

## 2017-03-22 NOTE — ED Provider Notes (Signed)
[facility]    CSN: [id number] Arrival date & time: 03/22/17  1647     History   Chief Complaint Chief Complaint  Patient presents with  . Fever    HPI Wayne Santos is a 66 y.o. male. he presents today with 6wk hx contact lens stuck in right eye, became painful/red in the last ?few days.  Also has chronic sinus drainage/cough, worse in the last 3d with increased productive cough and streak hemoptysis today.  Chills yesterday.  Intermittent N/V, not new.  No diarrhea.      HPI  Past Medical History:  Diagnosis Date  . Anemia   . AVN (avascular necrosis of bone) (HCC)   . Benign paroxysmal positional vertigo 06/15/2015  . CAD (coronary artery disease), native coronary artery    a. 08/24/16 NSTEMI/PCI: LM nl, LAD 40p, D1 60ost, D2 80ost-small, LCX 5159m (2.5x30 ONyx DES), OM1/2 min irregs, OM3 nl, RCA 30p/m, RPDA/RPAV/RPL1/RPL2/RPL3 nl, EF 55-65%.  . Childhood asthma   . GERD (gastroesophageal reflux disease)   . Glaucoma   . History of pneumonia    "4 times in my life" (08/24/2016)  . Hyperlipidemia   . Hypertension   . Memory impairment 06/15/2015  . Osteoporosis   . Primary localized osteoarthrosis of left hip 07/05/2016  . Seasonal allergies   . Tobacco abuse     Patient Active Problem List   Diagnosis Date Noted  . CAD (coronary artery disease), native coronary artery 01/11/2017  . NSTEMI (non-ST elevated myocardial infarction) (HCC) 08/24/2016  . Primary localized osteoarthrosis of left hip 07/05/2016  . Primary osteoarthritis of left hip 07/05/2016  . Encounter for long-term (current) use of medications 05/18/2016  . Routine screening for STI (sexually transmitted infection) 05/18/2016  . Benign paroxysmal positional vertigo 06/15/2015  . Memory impairment 06/15/2015  . Benign essential HTN 06/02/2014  . Hyperglycemia 02/04/2013  . AVN (avascular necrosis of bone) (HCC)   . Glaucoma   . Hyperlipidemia 03/16/2011  . ONYCHOMYCOSIS, TOENAILS  03/24/2010  . SMOKER 03/24/2010  . 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 POSTOPERATIVE INFECTION 07/10/2008  . Sinusitis, chronic 12/13/2007  . URINARY TRACT INFECTION SITE NOT SPECIFIED 04/04/2007  . BOILS, RECURRENT 11/14/2006  . HIV disease (HCC) 09/12/2006  . PANIC ATTACK 09/12/2006  . Essential hypertension 09/12/2006  . 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 CARDIAC CATHETERIZATION    . CORONARY ANGIOPLASTY WITH STENT PLACEMENT  08/24/2016  . HAND NERVE REPAIR Right 1990s?   "caught it in a machine; had to be reconstructed"  . JOINT REPLACEMENT    . TOTAL HIP ARTHROPLASTY Right        Home Medications    Prior to Admission medications   Medication Sig Start Date End Date Taking? Authorizing Provider  amLODipine (NORVASC) 5 MG tablet TAKE 1 TABLET BY MOUTH EVERY DAY 02/03/17   Daiva EvesVan Dam, Lisette Grinderornelius N, MD  amoxicillin-clavulanate (AUGMENTIN) [phone] MG tablet Take 1 tablet 2 (two) times daily for 10 days by mouth. 03/22/17 04/01/17  Eustace MooreMurray, Uchechukwu Dhawan W, MD  aspirin EC 81 MG EC tablet Take 1 tablet (81 mg total) by mouth daily. 08/25/16   Arty Baumgartneroberts, Lindsay B, NP  cetirizine (ZYRTEC) 10 MG tablet Take 1 tablet (10 mg total) by mouth at bedtime. 11/17/16   Sherren MochaShaw, Eva N, MD  clopidogrel (PLAVIX) 75 MG tablet Take 1 tablet (75 mg total) by mouth daily. 08/25/16   Arty Baumgartneroberts, Lindsay B, NP  DESCOVY  200-25 MG tablet TAKE 1 TABLET BY MOUTH ONCE DAILY 11/07/16   Van Dam, Cornelius N, MD  methocarbamol (ROBAXIN) 500 MG tablet Take 1 tablet by mouth as needed. 08/04/16   [provider]  metoprolol tartrate (LOPRESSOR) 25 MG tablet Take 0.5 tablets (12.5 mg total) by mouth 2 (two) times daily. 08/25/16   Roberts, Lindsay B, NP  mometasone (NASONEX) 50 MCG/ACT nasal spray Place 2 sprays into the nose daily. Patient taking differently: Place 2 sprays into the nose daily as needed (allergies).  11/02/15   Shaw, Eva N, MD  moxifloxacin (VIGAMOX) 0.5 %  ophthalmic solution Place 1 drop 4 (four) times daily into the right eye. Start tonight 03/22/17   Aariona Momon W, MD  Multiple Vitamin (MULTIVITAMIN) tablet Take 1 tablet by mouth daily.    [provider]  nitroGLYCERIN (NITROSTAT) 0.4 MG SL tablet Place 1 tablet (0.4 mg total) under the tongue every 5 (five) minutes x 3 doses as needed for chest pain. 08/25/16   Roberts, Lindsay B, NP  oxyCODONE-acetaminophen (PERCOCET/ROXICET) 5-325 MG tablet Take 1 tablet by mouth daily as needed. 08/04/16   [provider]  pravastatin (PRAVACHOL) 40 MG tablet TAKE 1 TABLET BY MOUTH DAILY 12/02/16   Van Dam, Cornelius N, MD  PREZCOBIX 800-150 MG tablet TAKE 1 TABLET BY MOUTH DAILY 11/07/16   Van Dam, Cornelius N, MD  Vitamin D, Ergocalciferol, (DRISDOL) 50000 UNITS CAPS capsule Take 1 capsule (50,000 Units total) by mouth every 7 (seven) days. 04/08/13   Van Dam, Cornelius N, MD    Family History No family history on file.  Social History Social History   Tobacco Use  . Smoking status: Former Smoker    Packs/day: 0.50    Years: 47.00    Pack years: 23.50    Types: Cigarettes  . Smokeless tobacco: Never Used  Substance Use Topics  . AlClearance [id number]    Alcohol/week: 0.0 oz    Comment: 08/24/2016 "I'll hClearance [id number] supple.  Cardiovascular: Normal rate  and regular rhythm.  Pulmonary/Chest: No respiratory distress. He has no rales.  Coarse breath sounds, much more pronounced on the right  Abdominal: He exhibits no distension.  Musculoskeletal: Normal range of motion.  Neurological: He is alert and oriented to person, place, and time.  Skin: Skin is warm and dry.  No cyanosis  Nursing note and vitals reviewed.    UC Treatments / Results   Radiology Dg Chest 2 View  Result Date: 03/22/2017 CLINICAL DATA:  66 year old male with fever and cough. EXAM: CHEST  2 VIEW COMPARISON:  Chest radiograph dated 08/23/2016 FINDINGS: The heart size and mediastinal contours are within normal limits. Both lungs are clear. The visualized skeletal structures are unremarkable. IMPRESSION: No active cardiopulmonary disease. Electronically Signed   By: Elgie Collard M.D.   On: 03/22/2017 18:33    Procedures Procedures (including critical care time)  Medications Ordered in UC Medications  methylPREDNISolone sodium succinate (SOLU-MEDROL) 125 mg/2 mL injection 80 mg (80 mg Intramuscular Given 03/22/17 1850)   Several drops of tetracaine instilled in right eye after irrigation with saline per nursing staff.  Contact lens removed from right eye with cotton swab.  Patient to followup with ophtho tomorrow am; start vigamox drops tonight.    Final Clinical Impressions(s) / UC Diagnoses   Final diagnoses:  Acute sinusitis with symptoms > 10 days  Acute bronchitis with bronchospasm    Corneal ulcer of right eye   Contact lens was removed from the right eye in the urgent care today.  Do not wear contact lenses in either eye until cleared by the eye doctor.  Call the eye doctor, Dr Tawny Asal, first thing in the morning to be seen tomorrow to recheck eye.  Corneal ulcers can cause blindness so you will need close followup with the eye doctor until healed.  Prescription for vigamox eye drops sent to the pharmacy, please start this tonight. Chest xray was negative for pneumonia today at urgent care.  Your symptoms seem most consistent with sinus infection and secondary bronchitis.  Injection of solumedrol (steroid) was given to help with cough, and prescription for amoxicillin/clavulanate (antibiotic) was sent to the pharmacy.  Anticipate gradual improvement in cough/congestion over the next several days.  Recheck for new fever >100.5, increasing phlegm production/nasal discharge, or if not starting to improve in a few days.     ED Discharge Orders        Ordered    moxifloxacin (VIGAMOX) 0.5 % ophthalmic solution  4 times daily     03/22/17 1829    amoxicillin-clavulanate (AUGMENTIN) 875-125 MG tablet  2 times daily     03/22/17 1843       Controlled Substance Prescriptions [place] Controlled Substance Registry consulted? Not Applicable   Eustace Moore, MD 03/24/17 2100

## 2017-03-22 NOTE — ED Triage Notes (Signed)
Patient says he has a contact in right eye that he has been trying to get out for 6 weeks.  Right eye is red.    Patient says he noticed a fever yesterday.  Increased in chest congestion and noticed bloody streaks in phlegm the last 2 days.  Uri symptoms started sunday

## 2017-03-22 NOTE — Discharge Instructions (Addendum)
Contact lens was removed from the right eye in the urgent care today.  Do not wear contact lenses in either eye until cleared by the eye doctor.  Call the eye doctor, Dr Tawny AsalBottorff, first thing in the morning to be seen tomorrow to recheck eye.  Corneal ulcers can cause blindness so you will need close followup with the eye doctor until healed.  Prescription for vigamox eye drops sent to the pharmacy, please start this tonight. Chest xray was negative for pneumonia today at urgent care.  Your symptoms seem most consistent with sinus infection and secondary bronchitis.  Injection of solumedrol (steroid) was given to help with cough, and prescription for amoxicillin/clavulanate (antibiotic) was sent to the pharmacy.  Anticipate gradual improvement in cough/congestion over the next several days.  Recheck for new fever >100.5, increasing phlegm production/nasal discharge, or if not starting to improve in a few days.

## 2017-04-05 ENCOUNTER — Other Ambulatory Visit: Payer: Self-pay | Admitting: Infectious Disease

## 2017-04-06 IMAGING — CR DG CHEST 2V
2 series · 2 of 2 positions shown · non-contrast
Comparison: October 06, 2013

CLINICAL DATA: Preoperative evaluation for left total hip
replacement. Hypertension.

EXAM:
CHEST  2 VIEW

[w chest pa]
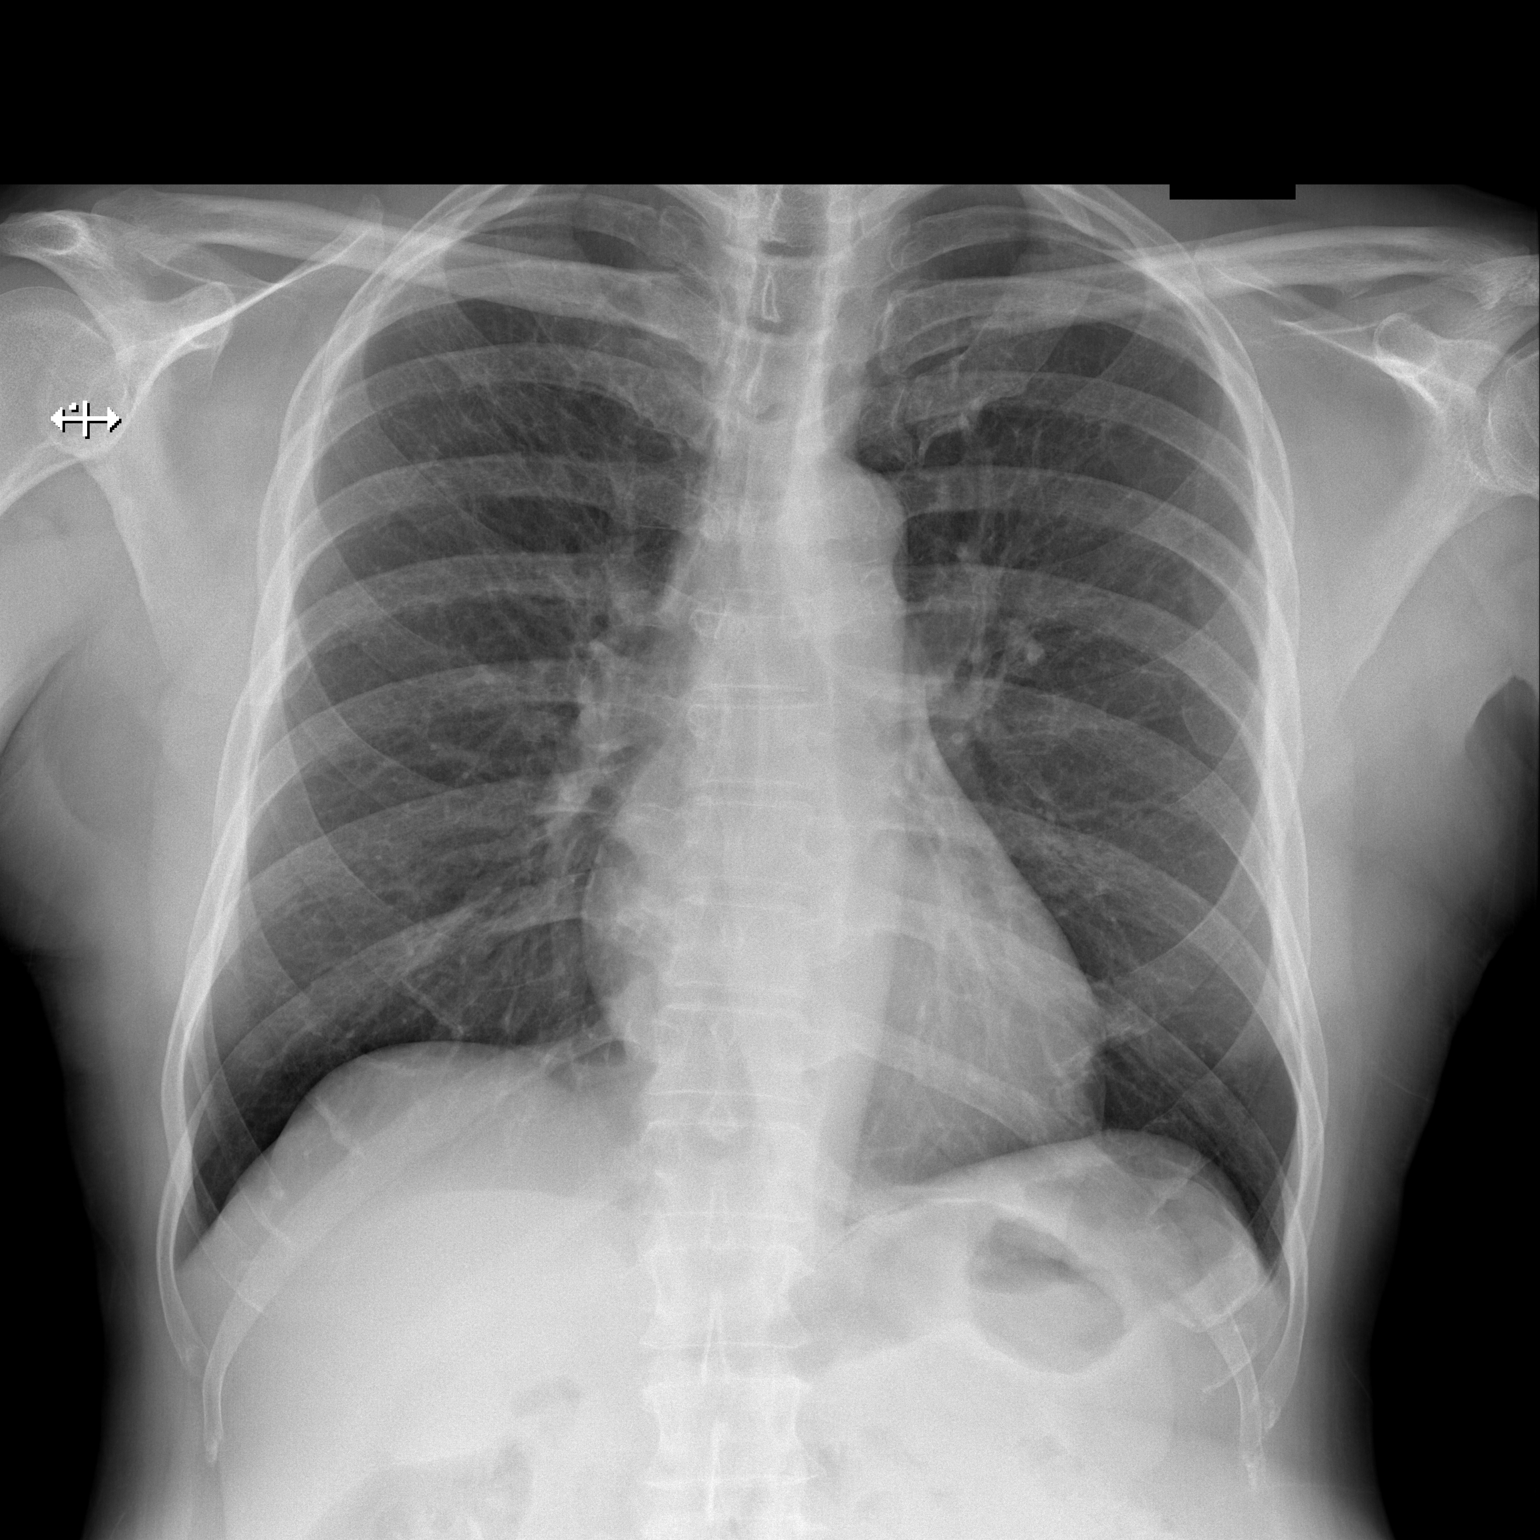

[w chest lat]
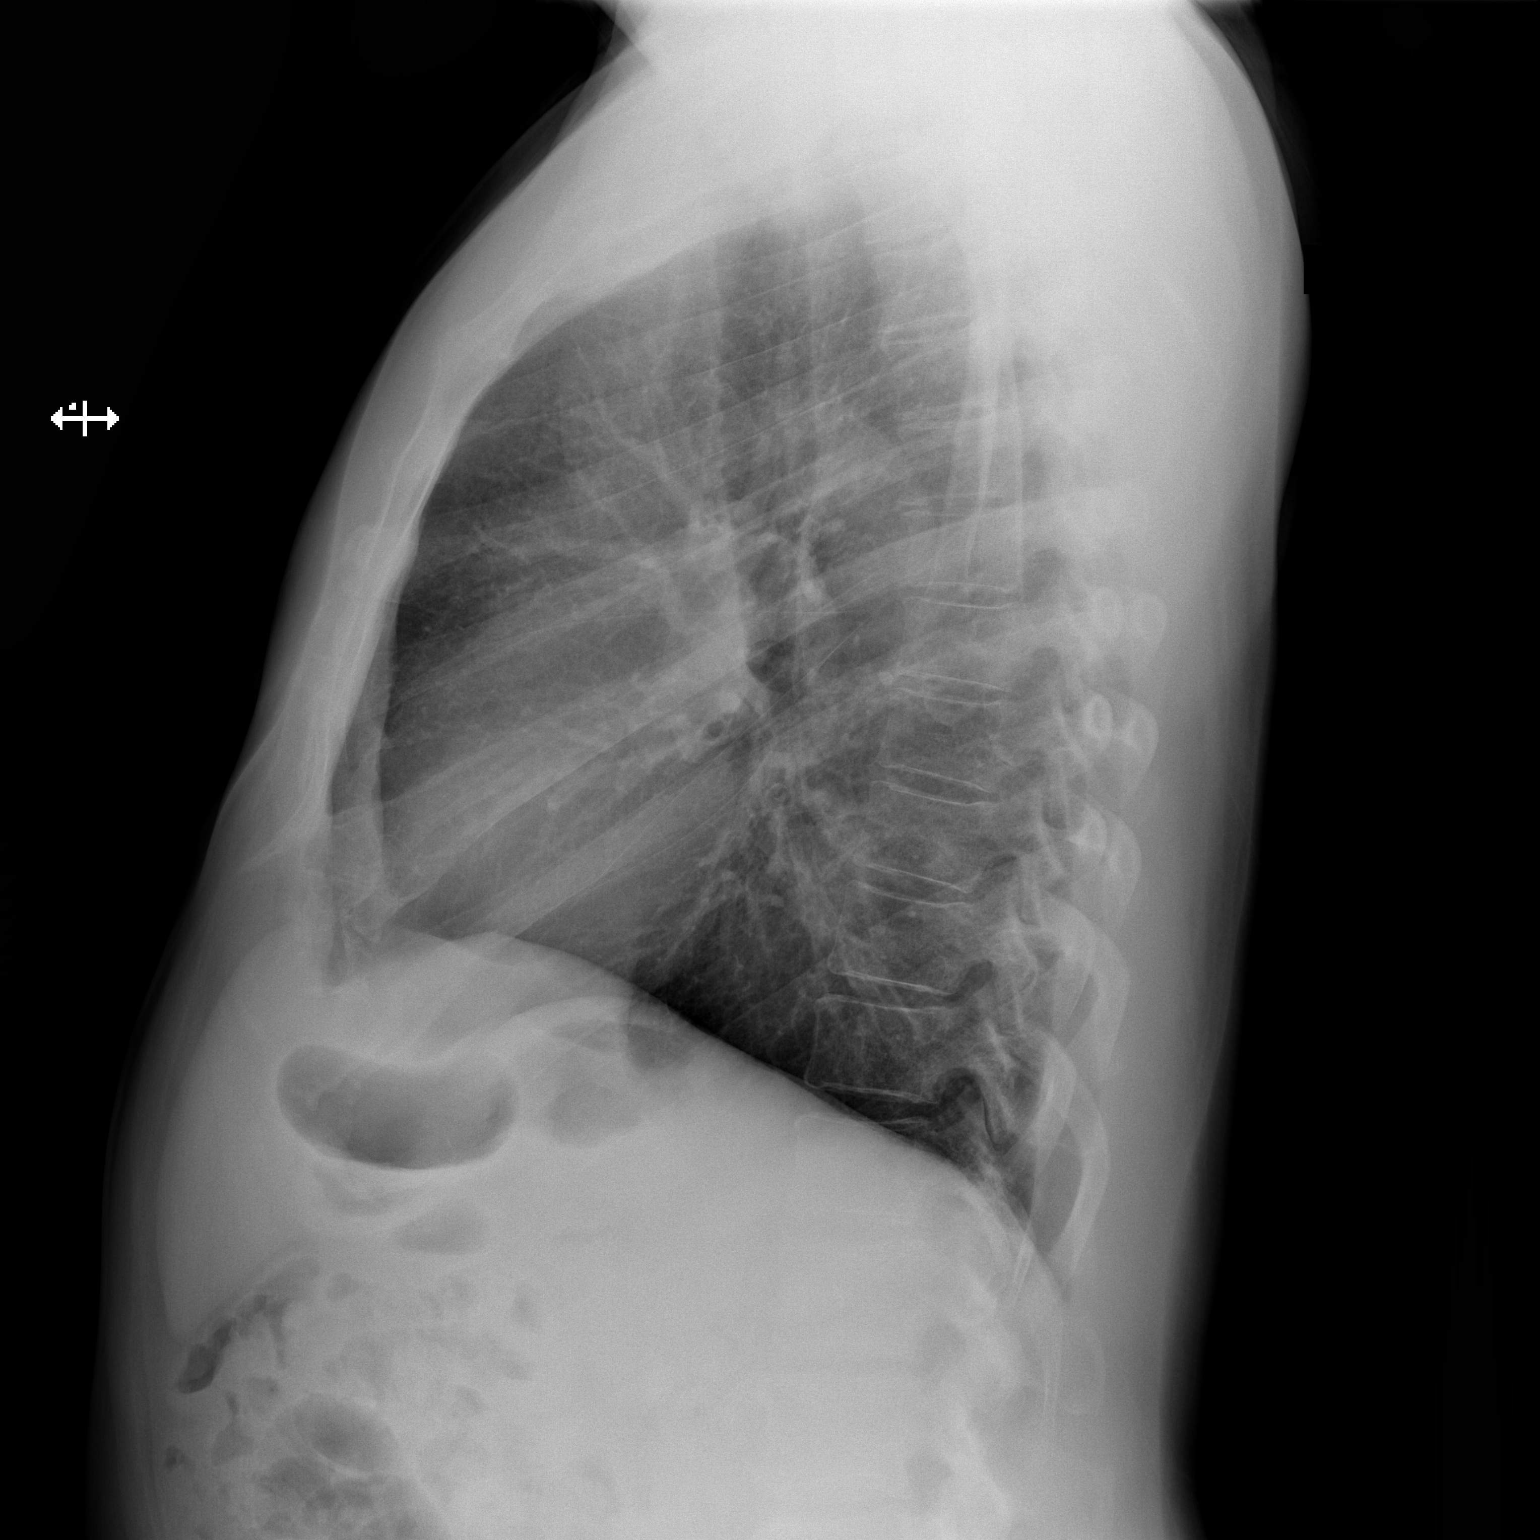

[2 of 2 positions shown; findings below may reference images not displayed]

FINDINGS: Lungs are clear. Heart size and pulmonary vascularity are normal. No
adenopathy. No bone lesions.
IMPRESSION: No edema or consolidation.

## 2017-05-15 ENCOUNTER — Other Ambulatory Visit: Payer: Self-pay | Admitting: Infectious Disease

## 2017-05-15 DIAGNOSIS — B20 Human immunodeficiency virus [HIV] disease: Secondary | ICD-10-CM

## 2017-05-17 ENCOUNTER — Other Ambulatory Visit: Payer: Medicare Other

## 2017-05-17 DIAGNOSIS — F17200 Nicotine dependence, unspecified, uncomplicated: Secondary | ICD-10-CM

## 2017-05-17 DIAGNOSIS — Z79899 Other long term (current) drug therapy: Secondary | ICD-10-CM

## 2017-05-17 DIAGNOSIS — B20 Human immunodeficiency virus [HIV] disease: Secondary | ICD-10-CM

## 2017-05-17 DIAGNOSIS — Z113 Encounter for screening for infections with a predominantly sexual mode of transmission: Secondary | ICD-10-CM

## 2017-05-17 DIAGNOSIS — M8700 Idiopathic aseptic necrosis of unspecified bone: Secondary | ICD-10-CM

## 2017-05-17 DIAGNOSIS — I10 Essential (primary) hypertension: Secondary | ICD-10-CM

## 2017-05-17 DIAGNOSIS — I2510 Atherosclerotic heart disease of native coronary artery without angina pectoris: Secondary | ICD-10-CM

## 2017-05-17 DIAGNOSIS — I214 Non-ST elevation (NSTEMI) myocardial infarction: Secondary | ICD-10-CM

## 2017-05-18 LAB — CBC WITH DIFFERENTIAL/PLATELET
BASOS PCT: 1 %
Basophils Absolute: 61 cells/uL (ref 0–200)
EOS PCT: 3.3 %
Eosinophils Absolute: 201 cells/uL (ref 15–500)
HCT: 38.1 % — ABNORMAL LOW (ref 38.5–50.0)
Hemoglobin: 13.3 g/dL (ref 13.2–17.1)
Lymphs Abs: 1976 cells/uL (ref 850–3900)
MCH: 30.5 pg (ref 27.0–33.0)
MCHC: 34.9 g/dL (ref 32.0–36.0)
MCV: 87.4 fL (ref 80.0–100.0)
MONOS PCT: 6.2 %
MPV: 11.8 fL (ref 7.5–12.5)
NEUTROS ABS: 3483 {cells}/uL (ref 1500–7800)
Neutrophils Relative %: 57.1 %
Platelets: 231 10*3/uL (ref 140–400)
RBC: 4.36 10*6/uL (ref 4.20–5.80)
RDW: 11.5 % (ref 11.0–15.0)
TOTAL LYMPHOCYTE: 32.4 %
WBC mixed population: 378 cells/uL (ref 200–950)
WBC: 6.1 10*3/uL (ref 3.8–10.8)

## 2017-05-18 LAB — LIPID PANEL
CHOL/HDL RATIO: 6.7 (calc) — AB (ref <5.0)
Cholesterol: 201 mg/dL — ABNORMAL HIGH (ref <200)
HDL: 30 mg/dL — ABNORMAL LOW (ref >40)
LDL CHOLESTEROL (CALC): 132 mg/dL — AB
NON-HDL CHOLESTEROL (CALC): 171 mg/dL — AB (ref <130)
Triglycerides: 252 mg/dL — ABNORMAL HIGH (ref <150)

## 2017-05-18 LAB — COMPLETE METABOLIC PANEL WITH GFR
AG Ratio: 1.4 (calc) (ref 1.0–2.5)
ALKALINE PHOSPHATASE (APISO): 141 U/L — AB (ref 40–115)
ALT: 14 U/L (ref 9–46)
AST: 13 U/L (ref 10–35)
Albumin: 4.4 g/dL (ref 3.6–5.1)
BILIRUBIN TOTAL: 0.5 mg/dL (ref 0.2–1.2)
BUN: 14 mg/dL (ref 7–25)
CALCIUM: 9.5 mg/dL (ref 8.6–10.3)
CHLORIDE: 98 mmol/L (ref 98–110)
CO2: 24 mmol/L (ref 20–32)
Creat: 1.18 mg/dL (ref 0.70–1.25)
GFR, EST NON AFRICAN AMERICAN: 64 mL/min/{1.73_m2} (ref >=60)
GFR, Est African American: 75 mL/min/{1.73_m2} (ref >=60)
Globulin: 3.2 g/dL (calc) (ref 1.9–3.7)
Glucose, Bld: 491 mg/dL — ABNORMAL HIGH (ref 65–99)
POTASSIUM: 3.8 mmol/L (ref 3.5–5.3)
Sodium: 134 mmol/L — ABNORMAL LOW (ref 135–146)
Total Protein: 7.6 g/dL (ref 6.1–8.1)

## 2017-05-18 LAB — T-HELPER CELL (CD4) - (RCID CLINIC ONLY)
CD4 % Helper T Cell: 30 % — ABNORMAL LOW (ref 33–55)
CD4 T Cell Abs: 690 /uL (ref 400–2700)

## 2017-05-18 LAB — RPR: RPR Ser Ql: NONREACTIVE

## 2017-05-19 LAB — HIV-1 RNA QUANT-NO REFLEX-BLD
HIV 1 RNA QUANT: NOT DETECTED {copies}/mL
HIV-1 RNA QUANT, LOG: NOT DETECTED {Log_copies}/mL

## 2017-05-31 ENCOUNTER — Ambulatory Visit: Payer: Medicare Other | Admitting: Infectious Disease

## 2017-06-05 ENCOUNTER — Encounter: Payer: Self-pay | Admitting: Infectious Disease

## 2017-06-05 ENCOUNTER — Ambulatory Visit (INDEPENDENT_AMBULATORY_CARE_PROVIDER_SITE_OTHER): Payer: Medicare Other | Admitting: Infectious Disease

## 2017-06-05 DIAGNOSIS — Z23 Encounter for immunization: Secondary | ICD-10-CM

## 2017-06-05 DIAGNOSIS — B20 Human immunodeficiency virus [HIV] disease: Secondary | ICD-10-CM

## 2017-06-05 MED ORDER — DARUN-COBIC-EMTRICIT-TENOFAF 800-150-200-10 MG PO TABS: 1.0000 | ORAL_TABLET | Freq: Every day | ORAL | 11 refills | Status: DC

## 2017-06-05 MED ORDER — ROSUVASTATIN CALCIUM 20 MG PO TABS: 20.0000 mg | ORAL_TABLET | Freq: Every day | ORAL | 11 refills | Status: DC

## 2017-06-05 NOTE — Patient Instructions (Signed)
We are going to try to   Send SYMTUZA to [facility] as YOUR ONE PILL ONCE A DAY FOR HIV  IF it is not covered then we will have to continue with Prezcobix and Descovy  If it IS covered then please stop the Prezcobix and Descovy when you start the [facility]  I sent in generic crestor (rosuvastatin) 20mg  to take the place of your pravastatin to lower your bad choleseterol

## 2017-06-05 NOTE — Progress Notes (Signed)
Chief complaint: followup for HIV on medications   Subjective:    Patient ID: Wayne Santos, male    DOB: Mar 06, 1951, 67 y.o.   MRN: [id number]  HPI  Wayne Santos is a 67 y.o. male who is doing superbly well on his  antiviral regimen, Prezcobix and Descovy with undetectable viral load and health cd4 count.   Unfortunately he has had comorbid HTN, hyperlipidemia and  smoking that I could never convince him to stop and ultimately he did develop a non STEMI. He underwent cardiac catheterization by Dr. [place] Sink and was found to have a 95% mid circumflex stenosis which was successfully stented with a 2.530 mm Resolute onyx DES stent.  He had mild concomitant CAD with 30% proximal and mid RCA stenoses, 60% diagonal 1, 80% diagonal 2 stenoses and small vessels with mild 40% proximal LAD narrowing, irregularity in the circumflex marginal branches  He has  had bilateral THA for AVN.   He is without any complaints today.    Lab Results  Component Value Date   HIV1RNAQUANT <20 NOT DETECTED 05/17/2017   HIV1RNAQUANT <20 (H) 01/11/2017   HIV1RNAQUANT <20 05/12/2016   Lab Results  Component Value Date   CD4TABS 690 05/17/2017   CD4TABS 970 01/11/2017   CD4TABS 1,010 05/12/2016      Past Medical History:  Diagnosis Date  . Anemia   . AVN (avascular necrosis of bone) (HCC)   . Benign paroxysmal positional vertigo 06/15/2015  . CAD (coronary artery disease), native coronary artery    a. 08/24/16 NSTEMI/PCI: LM nl, LAD 40p, D1 60ost, D2 80ost-small, LCX 48m (2.5x30 ONyx DES), OM1/2 min irregs, OM3 nl, RCA 30p/m, RPDA/RPAV/RPL1/RPL2/RPL3 nl, EF 55-65%.  . Childhood asthma   . GERD (gastroesophageal reflux disease)   . Glaucoma   . History of pneumonia    "4 times in my life" (08/24/2016)  . Hyperlipidemia   . Hypertension   . Memory impairment 06/15/2015  . Osteoporosis   . Primary localized osteoarthrosis of left hip 07/05/2016  . Seasonal allergies   . Tobacco abuse     Past  Surgical History:  Procedure Laterality Date  . CARDIAC CATHETERIZATION    . CORONARY ANGIOPLASTY WITH STENT PLACEMENT  08/24/2016  . CORONARY STENT INTERVENTION N/A 08/24/2016   Procedure: Coronary Stent Intervention;  Surgeon: Iran Ouch, MD;  Location: [facility];  Service: Cardiovascular;  Laterality: N/A;  . HAND NERVE REPAIR Right 1990s?   "caught it in a machine; had to be reconstructed"  . JOINT REPLACEMENT    . LEFT HEART CATH AND CORONARY ANGIOGRAPHY N/A 08/24/2016   Procedure: Left Heart Cath and Coronary Angiography;  Surgeon: Iran Ouch, MD;  Location: [facility];  Service: Cardiovascular;  Laterality: N/A;  . TOTAL HIP ARTHROPLASTY Right   . TOTAL HIP ARTHROPLASTY Left 07/05/2016   Procedure: TOTAL HIP ARTHROPLASTY ANTERIOR APPROACH;  Surgeon: Marcene Corning, MD;  Location: [facility];  Service: Orthopedics;  Laterality: Left;    No family history on file.    Social History   Socioeconomic History  . Marital status: Single    Spouse name: None  . Number of children: None  . Years of education: None  . Highest education level: None  Social Needs  . Financial resource strain: None  . Food insecurity - worry: None  . Food insecurity - inability: None  . Transportation needs - medical: None  . Transportation needs - non-medical: None  Occupational History  .  None  Tobacco Use  . Smoking status: Former Smoker    Packs/day: 0.50    Years: 47.00    Pack years: 23.50    Types: Cigarettes  . Smokeless tobacco: Never Used  Substance and Sexual Activity  . Alcohol use: Yes    Alcohol/week: 0.0 oz    Comment: 08/24/2016 "I'll have a couple drinks maybe 4 times/year"  . Drug use: Yes    Types: Marijuana    Comment: 08/24/2016 "nothing since my younger days"  . Sexual activity: Yes    Comment: declined condoms  Other Topics Concern  . None  Social History Narrative  . None    Allergies  Allergen Reactions  . Ace Inhibitors Cough      Current Outpatient Medications:  .  amLODipine (NORVASC) 5 MG tablet, TAKE 1 TABLET BY MOUTH EVERY DAY, Disp: 30 tablet, Rfl: 4 .  aspirin EC 81 MG EC tablet, Take 1 tablet (81 mg total) by mouth daily., Disp: , Rfl:  .  cetirizine (ZYRTEC) 10 MG tablet, Take 1 tablet (10 mg total) by mouth at bedtime., Disp: 30 tablet, Rfl: 11 .  clopidogrel (PLAVIX) 75 MG tablet, Take 1 tablet (75 mg total) by mouth daily., Disp: 30 tablet, Rfl: 11 .  DESCOVY 200-25 MG tablet, TAKE 1 TABLET BY MOUTH ONCE DAILY, Disp: 30 tablet, Rfl: 0 .  methocarbamol (ROBAXIN) 500 MG tablet, Take 1 tablet by mouth as needed., Disp: , Rfl: 0 .  metoprolol tartrate (LOPRESSOR) 25 MG tablet, Take 0.5 tablets (12.5 mg total) by mouth 2 (two) times daily., Disp: 60 tablet, Rfl: 3 .  mometasone (NASONEX) 50 MCG/ACT nasal spray, Place 2 sprays into the nose daily. (Patient taking differently: Place 2 sprays into the nose daily as needed (allergies). ), Disp: 17 g, Rfl: 12 .  moxifloxacin (VIGAMOX) 0.5 % ophthalmic solution, Place 1 drop 4 (four) times daily into the right eye. Start tonight, Disp: 3 mL, Rfl: 0 .  Multiple Vitamin (MULTIVITAMIN) tablet, Take 1 tablet by mouth daily., Disp: , Rfl:  .  nitroGLYCERIN (NITROSTAT) 0.4 MG SL tablet, Place 1 tablet (0.4 mg total) under the tongue every 5 (five) minutes x 3 doses as needed for chest pain., Disp: 25 tablet, Rfl: 3 .  oxyCODONE-acetaminophen (PERCOCET/ROXICET) 5-325 MG tablet, Take 1 tablet by mouth daily as needed., Disp: , Rfl:  .  pravastatin (PRAVACHOL) 40 MG tablet, TAKE 1 TABLET BY MOUTH DAILY, Disp: 30 tablet, Rfl: 5 .  PREZCOBIX 800-150 MG tablet, TAKE 1 TABLET BY MOUTH DAILY, Disp: 30 tablet, Rfl: 0 .  Vitamin D, Ergocalciferol, (DRISDOL) 50000 UNITS CAPS capsule, Take 1 capsule (50,000 Units total) by mouth every 7 (seven) days., Disp: 8 capsule, Rfl: 0     Review of Systems  Constitutional: Positive for unexpected weight change. Negative for activity change,  appetite change, chills, diaphoresis, fatigue and fever.  HENT: Negative for congestion, rhinorrhea, sinus pressure, sneezing, sore throat and trouble swallowing.   Eyes: Negative for photophobia and visual disturbance.  Respiratory: Positive for shortness of breath. Negative for cough, chest tightness, wheezing and stridor.   Cardiovascular: Negative for chest pain, palpitations and leg swelling.  Gastrointestinal: Negative for abdominal distention, abdominal pain, anal bleeding, blood in stool, constipation, diarrhea, nausea and vomiting.  Genitourinary: Negative for difficulty urinating, dysuria, flank pain and hematuria.  Musculoskeletal: Negative for arthralgias, back pain, gait problem, joint swelling and myalgias.  Skin: Negative for color change, pallor, rash and wound.  Neurological: Positive for dizziness. Negative  for tremors, weakness and light-headedness.  Hematological: Negative for adenopathy. Does not bruise/bleed easily.  Psychiatric/Behavioral: Negative for agitation, behavioral problems, confusion, decreased concentration, dysphoric mood and sleep disturbance.       Objective:   Physical Exam  Constitutional: He is oriented to person, place, and time. He appears well-developed and well-nourished.  HENT:  Head: Normocephalic and atraumatic.  Eyes: Conjunctivae and EOM are normal.  Neck: Normal range of motion. Neck supple.  Cardiovascular: Normal rate and regular rhythm.  Pulmonary/Chest: Effort normal. No respiratory distress. He has no wheezes.  Abdominal: Soft. He exhibits no distension.  Musculoskeletal: Normal range of motion. He exhibits no edema or tenderness.  Neurological: He is alert and oriented to person, place, and time.  Skin: Skin is warm and dry. No rash noted. No erythema. No pallor.  Psychiatric: He has a normal mood and affect. His behavior is normal. Judgment and thought content normal.  Nursing note and vitals reviewed.         Assessment &  Plan:  HIV disease: I am going to see if we can fill SYMTUZA. He has Medicare and SPAP but maybe Medicare formulary will over rule the ADAP formulary?    He has largely been on PI based therapy and I discerned in cursive handwritten not by Dr. Roxan Hockey "salvage therapy" when the patient was on Kaletra and Truvada.   CAD: needs to continue plavix, asa, beta blocker and I will intensity his statin therapy with crestor 20 mg  HTN: continue norvasc  And beta blocker  AVN: sp bilateral THA  Hyperlipidemia: change to crestor 20mg , also check direct LDL today  I spent greater than 25 minutes with the patient including greater than 50% of time in face to face counsel of the patient re his new regimen which is more optimally co formulated version of his current regimen, his new statin  and in coordination of his care.

## 2017-06-06 LAB — LDL CHOLESTEROL, DIRECT: LDL DIRECT: 113 mg/dL — AB (ref <100)

## 2017-06-28 ENCOUNTER — Encounter (HOSPITAL_COMMUNITY): Payer: Self-pay | Admitting: Emergency Medicine

## 2017-06-28 ENCOUNTER — Other Ambulatory Visit: Payer: Self-pay

## 2017-06-28 ENCOUNTER — Ambulatory Visit (HOSPITAL_COMMUNITY)
Admission: EM | Admit: 2017-06-28 | Discharge: 2017-06-28 | Disposition: A | Discharge status: Home / Self Care | Payer: Medicare Other | Attending: Family Medicine | Admitting: Family Medicine

## 2017-06-28 DIAGNOSIS — R42 Dizziness and giddiness: Secondary | ICD-10-CM | POA: Diagnosis not present

## 2017-06-28 DIAGNOSIS — K219 Gastro-esophageal reflux disease without esophagitis: Secondary | ICD-10-CM | POA: Diagnosis not present

## 2017-06-28 MED ORDER — GI COCKTAIL ~~LOC~~
30.0000 mL | Freq: Once | ORAL | Status: AC
Start: 2017-06-28 — End: 2017-06-28
  Administered 2017-06-28: 30 mL via ORAL

## 2017-06-28 MED ORDER — MECLIZINE HCL 12.5 MG PO TABS: 12.5000 mg | ORAL_TABLET | Freq: Two times a day (BID) | ORAL | 0 refills | Status: AC | PRN

## 2017-06-28 MED ORDER — GI COCKTAIL ~~LOC~~
ORAL | Status: AC
  Filled 2017-06-28: qty 30

## 2017-06-28 MED ORDER — RANITIDINE HCL 150 MG PO TABS: 150.0000 mg | ORAL_TABLET | Freq: Two times a day (BID) | ORAL | 0 refills | Status: DC

## 2017-06-28 NOTE — ED Triage Notes (Signed)
Reports dizziness.  Dizziness for the last 1 1/2 weeks .  Patient also complaining of reflux

## 2017-06-28 NOTE — ED Provider Notes (Signed)
[facility]    CSN: [id number] Arrival date & time: 06/28/17  1836     History   Chief Complaint Chief Complaint  Patient presents with  . Dizziness    HPI Wayne Santos is a 67 y.o. male history of hypertension, HIV, hyperlipidemia presenting today with dizziness and reflux.  He states that he has had dizziness for the past 5 days especially when he abruptly stands up or turns his head quickly.  He has a history of BPPV for approximately 2 years.  He has never taken anything for this.  Recently worsening.  Dizziness is described as room spinning not lightheadedness.  Denies sensation of passing out.  Denies changes in vision.  Denies one-sided weakness.  Denies difficulty speaking.  Patient also with some reflux.  States that he has had a burning sensation going from his stomach to his throat.  Is been going on for 1-1/2 weeks now.  States that he has not been eating as much because it worsens when he eats.  Feels he has become hoarse and has a sour taste in his mouth.  Denies increase in sensation with exertion.  HPI  Past Medical History:  Diagnosis Date  . Anemia   . AVN (avascular necrosis of bone) (HCC)   . Benign paroxysmal positional vertigo 06/15/2015  . CAD (coronary artery disease), native coronary artery    a. 08/24/16 NSTEMI/PCI: LM nl, LAD 40p, D1 60ost, D2 80ost-small, LCX 5644m (2.5x30 ONyx DES), OM1/2 min irregs, OM3 nl, RCA 30p/m, RPDA/RPAV/RPL1/RPL2/RPL3 nl, EF 55-65%.  . Childhood asthma   . GERD (gastroesophageal reflux disease)   . Glaucoma   . History of pneumonia    "4 times in my life" (08/24/2016)  . Hyperlipidemia   . Hypertension   . Memory impairment 06/15/2015  . Osteoporosis   . Primary localized osteoarthrosis of left hip 07/05/2016  . Seasonal allergies   . Tobacco abuse     Patient Active Problem List   Diagnosis Date Noted  . CAD (coronary artery disease), native coronary artery 01/11/2017  . NSTEMI (non-ST elevated myocardial  infarction) (HCC) 08/24/2016  . Primary localized osteoarthrosis of left hip 07/05/2016  . Primary osteoarthritis of left hip 07/05/2016  . Encounter for long-term (current) use of medications 05/18/2016  . Routine screening for STI (sexually transmitted infection) 05/18/2016  . Benign paroxysmal positional vertigo 06/15/2015  . Memory impairment 06/15/2015  . Benign essential HTN 06/02/2014  . Hyperglycemia 02/04/2013  . AVN (avascular necrosis of bone) (HCC)   . Glaucoma   . Hyperlipidemia 03/16/2011  . ONYCHOMYCOSIS, TOENAILS 03/24/2010  . SMOKER 03/24/2010  . 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 POSTOPERATIVE INFECTION 07/10/2008  . Sinusitis, chronic 12/13/2007  . URINARY TRACT INFECTION SITE NOT SPECIFIED 04/04/2007  . BOILS, RECURRENT 11/14/2006  . HIV disease (HCC) 09/12/2006  . PANIC ATTACK 09/12/2006  . Essential hypertension 09/12/2006  . 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 CARDIAC CATHETERIZATION    . CORONARY ANGIOPLASTY WITH STENT PLACEMENT  08/24/2016  . CORONARY STENT INTERVENTION N/A 08/24/2016   Procedure: Coronary Stent Intervention;  Surgeon: Iran OuchMuhammad A Arida, MD;  Location: [facility];  Service: Cardiovascular;  Laterality: N/A;  . HAND NERVE REPAIR Right 1990s?   "caught it in a machine; had to be reconstructed"  . JOINT REPLACEMENT    . LEFT HEART CATH AND CORONARY ANGIOGRAPHY N/A 08/24/2016   Procedure: Left Heart Cath and Coronary  Angiography;  Surgeon: Iran Ouch, MD;  Location: [facility];  Service: Cardiovascular;  Laterality: N/A;  . TOTAL HIP ARTHROPLASTY Right   . TOTAL HIP ARTHROPLASTY Left 07/05/2016   Procedure: TOTAL HIP ARTHROPLASTY ANTERIOR APPROACH;  Surgeon: Marcene Corning, MD;  Location: [facility];  Service: Orthopedics;  Laterality: Left;       Home Medications    Prior to Admission medications   Medication Sig Start Date End Date Taking? Authorizing Provider    amLODipine (NORVASC) 5 MG tablet TAKE 1 TABLET BY MOUTH EVERY DAY 02/03/17  Yes Daiva Eves, Lisette Grinder, MD  aspirin EC 81 MG EC tablet Take 1 tablet (81 mg total) by mouth daily. 08/25/16  Yes Laverda Page B, NP  rosuvastatin (CRESTOR) 20 MG tablet Take 1 tablet (20 mg total) by mouth daily. 06/05/17  Yes Daiva Eves, Lisette Grinder, MD  cetirizine (ZYRTEC) 10 MG tablet Take 1 tablet (10 mg total) by mouth at bedtime. 11/17/16   Sherren Mocha, MD  clopidogrel (PLAVIX) 75 MG tablet Take 1 tablet (75 mg total) by mouth daily. 08/25/16   Arty Baumgartner, NP  Darunavir-Cobicisctat-Emtricitabine-Tenofovir Alafenamide [facility]) 800-150-200-10 MG TABS Take 1 tablet by mouth daily with breakfast. 06/05/17   Daiva Eves, Lisette Grinder, MD  meclizine (ANTIVERT) 12.5 MG tablet Take 1 tablet (12.5 mg total) by mouth 2 (two) times daily as needed for up to 10 days for dizziness. 06/28/17 07/08/17  Criss Pallone C, PA-C  methocarbamol (ROBAXIN) 500 MG tablet Take 1 tablet by mouth as needed. 08/04/16   [provider]  metoprolol tartrate (LOPRESSOR) 25 MG tablet Take 0.5 tablets (12.5 mg total) by mouth 2 (two) times daily. 08/25/16   Arty Baumgartner, NP  mometasone (NASONEX) 50 MCG/ACT nasal spray Place 2 sprays into the nose daily. Patient taking differently: Place 2 sprays into the nose daily as needed (allergies).  11/02/15   Sherren Mocha, MD  moxifloxacin (VIGAMOX) 0.5 % ophthalmic solution Place 1 drop 4 (four) times daily into the right eye. Start tonight Patient not taking: Reported on 06/05/2017 03/22/17   Isa Rankin, MD  Multiple Vitamin (MULTIVITAMIN) tablet Take 1 tablet by mouth daily.    [provider]  nitroGLYCERIN (NITROSTAT) 0.4 MG SL tablet Place 1 tablet (0.4 mg total) under the tongue every 5 (five) minutes x 3 doses as needed for chest pain. 08/25/16   Arty Baumgartner, NP  oxyCODONE-acetaminophen (PERCOCET/ROXICET) 5-325 MG tablet Take 1 tablet by mouth daily as needed. 08/04/16    [provider]  ranitidine (ZANTAC) 150 MG tablet Take 1 tablet (150 mg total) by mouth 2 (two) times daily. 06/28/17 07/28/17  Rogerick Baldwin C, PA-C  Vitamin D, Ergocalciferol, (DRISDOL) 50000 UNITS CAPS capsule Take 1 capsule (50,000 Units total) by mouth every 7 (seven) days. 04/08/13   Randall Hiss, MD    Family History No family history on file.  Social History Social History   Tobacco Use  . Smoking status: Former Smoker    Packs/day: 0.50    Years: 47.00    Pack years: 23.50    Types: Cigarettes  . Smokeless tobacco: Never Used  Substance Use Topics  . Alcohol use: Yes    Alcohol/week: 0.0 oz    Comment: 08/24/2016 "I'll have a couple drinks maybe 4 times/year"  . Drug use: Yes    Types: Marijuana    Comment: 08/24/2016 "nothing since my younger days"     Allergies  Ace inhibitors   Review of Systems Review of Systems  Constitutional: Negative for activity change, appetite change, fatigue and fever.  HENT: Negative for congestion, ear pain, postnasal drip, rhinorrhea, sinus pressure and sore throat.   Eyes: Negative for pain and itching.  Respiratory: Negative for cough and shortness of breath.   Cardiovascular: Positive for chest pain.  Gastrointestinal: Negative for abdominal pain, diarrhea, nausea and vomiting.  Musculoskeletal: Negative for myalgias.  Skin: Negative for rash.  Neurological: Positive for dizziness. Negative for weakness, light-headedness and headaches.     Physical Exam Triage Vital Signs ED Triage Vitals [06/28/17 1936]  Enc Vitals Group     BP 114/66     Pulse Rate 80     Resp 18     Temp 98.1 F (36.7 C)     Temp Source Oral     SpO2 98 %     Weight      Height      Head Circumference      Peak Flow      Pain Score      Pain Loc      Pain Edu?      Excl. in GC?    No data found.  Updated Vital Signs BP 114/66 (BP Location: Right Arm)   Pulse 80   Temp 98.1 F (36.7 C) (Oral)   Resp 18   SpO2 98%    Visual Acuity Right Eye Distance:   Left Eye Distance:   Bilateral Distance:    Right Eye Near:   Left Eye Near:    Bilateral Near:     Physical Exam  Constitutional: He appears well-developed and well-nourished.  HENT:  Head: Normocephalic and atraumatic.  Bilateral TMs without erythema, no sign of cholesteatoma.  Palate of mouth elevates symmetrically with phonation.  Eyes: Conjunctivae and EOM are normal. Pupils are equal, round, and reactive to light.  Neck: Neck supple.  Cardiovascular: Normal rate and regular rhythm.  No murmur heard. Pulmonary/Chest: Effort normal and breath sounds normal. No respiratory distress.  Discomfort not reproducible with palpation of chest  Abdominal: Soft. There is no tenderness.  Musculoskeletal: He exhibits no edema.  Neurological: He is alert.  Cranial nerves II through XII grossly intact, gait normal.  Skin: Skin is warm and dry.  Psychiatric: He has a normal mood and affect.  Nursing note and vitals reviewed.    UC Treatments / Results  Labs (all labs ordered are listed, but only abnormal results are displayed) Labs Reviewed - No data to display  EKG  EKG Interpretation None       Radiology No results found.  Procedures Procedures (including critical care time)  Medications Ordered in UC Medications  gi cocktail (Maalox,Lidocaine,Donnatal) (30 mLs Oral Given 06/28/17 2016)     Initial Impression / Assessment and Plan / UC Course  I have reviewed the triage vital signs and the nursing notes.  Pertinent labs & imaging results that were available during my care of the patient were reviewed by me and considered in my medical decision making (see chart for details).     GI cocktail provided for patient, provided him with immediate relief.  Will send home with Zantac.  Omeprazole had an interaction with clopidigril.  Dizziness history classic for BP BPPV.  Will provide meclizine to use as needed.  Advised him may  cause some drowsiness and not to drive while taking intake while at home for the first time.  Discussed strict return precautions. Patient  verbalized understanding and is agreeable with plan.   Final Clinical Impressions(s) / UC Diagnoses   Final diagnoses:  Vertigo  Gastroesophageal reflux disease, esophagitis presence not specified    ED Discharge Orders        Ordered    meclizine (ANTIVERT) 12.5 MG tablet  2 times daily PRN     06/28/17 2027    ranitidine (ZANTAC) 150 MG tablet  2 times daily     06/28/17 2027       Controlled Substance Prescriptions [place] Controlled Substance Registry consulted? Not Applicable   Lew Dawes, [place] 06/28/17 2040

## 2017-06-28 NOTE — Discharge Instructions (Signed)
Please take Zantac twice daily for reflux.  Please use meclizine as needed for dizziness, this will cause some drowsiness so be sure to be at home and do not drive after taking.

## 2017-07-08 ENCOUNTER — Other Ambulatory Visit: Payer: Self-pay | Admitting: Infectious Disease

## 2017-07-08 DIAGNOSIS — I10 Essential (primary) hypertension: Secondary | ICD-10-CM

## 2017-07-14 ENCOUNTER — Encounter (HOSPITAL_COMMUNITY): Payer: Self-pay | Admitting: Emergency Medicine

## 2017-07-14 ENCOUNTER — Ambulatory Visit (HOSPITAL_COMMUNITY)
Admission: EM | Admit: 2017-07-14 | Discharge: 2017-07-14 | Disposition: A | Discharge status: Home / Self Care | Payer: Medicare Other | Attending: Internal Medicine | Admitting: Internal Medicine

## 2017-07-14 DIAGNOSIS — L0291 Cutaneous abscess, unspecified: Secondary | ICD-10-CM | POA: Diagnosis not present

## 2017-07-14 MED ORDER — SULFAMETHOXAZOLE-TRIMETHOPRIM 800-160 MG PO TABS: 1.0000 | ORAL_TABLET | Freq: Two times a day (BID) | ORAL | 0 refills | Status: AC

## 2017-07-14 MED ORDER — PREDNISONE 50 MG PO TABS: 50.0000 mg | ORAL_TABLET | Freq: Every day | ORAL | 0 refills | Status: DC

## 2017-07-14 NOTE — ED Triage Notes (Signed)
Pt sts small abscess to bottom lip

## 2017-07-14 NOTE — ED Provider Notes (Signed)
[facility]    CSN: [id number] Arrival date & time: 07/14/17  1824     History   Chief Complaint Chief Complaint  Patient presents with  . Abscess    HPI Wayne Santos is a 67 y.o. male.   Patient presents today with 3-day history of increasing pimple-like swelling at the edge of the lower lip, now causing significant enlargement of his central lower lip.  Very painful.  Making it difficult to eat.  He calls it a "hair bump."  No fever, no malaise.    HPI  Past Medical History:  Diagnosis Date  . Anemia   . AVN (avascular necrosis of bone) (HCC)   . Benign paroxysmal positional vertigo 06/15/2015  . CAD (coronary artery disease), native coronary artery    a. 08/24/16 NSTEMI/PCI: LM nl, LAD 40p, D1 60ost, D2 80ost-small, LCX 25m (2.5x30 ONyx DES), OM1/2 min irregs, OM3 nl, RCA 30p/m, RPDA/RPAV/RPL1/RPL2/RPL3 nl, EF 55-65%.  . Childhood asthma   . GERD (gastroesophageal reflux disease)   . Glaucoma   . History of pneumonia    "4 times in my life" (08/24/2016)  . Hyperlipidemia   . Hypertension   . Memory impairment 06/15/2015  . Osteoporosis   . Primary localized osteoarthrosis of left hip 07/05/2016  . Seasonal allergies   . Tobacco abuse     Patient Active Problem List   Diagnosis Date Noted  . CAD (coronary artery disease), native coronary artery 01/11/2017  . NSTEMI (non-ST elevated myocardial infarction) (HCC) 08/24/2016  . Primary localized osteoarthrosis of left hip 07/05/2016  . Primary osteoarthritis of left hip 07/05/2016  . Encounter for long-term (current) use of medications 05/18/2016  . Routine screening for STI (sexually transmitted infection) 05/18/2016  . Benign paroxysmal positional vertigo 06/15/2015  . Memory impairment 06/15/2015  . Benign essential HTN 06/02/2014  . Hyperglycemia 02/04/2013  . AVN (avascular necrosis of bone) (HCC)   . Glaucoma   . Hyperlipidemia 03/16/2011  . ONYCHOMYCOSIS, TOENAILS 03/24/2010  . SMOKER 03/24/2010   . 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 POSTOPERATIVE INFECTION 07/10/2008  . Sinusitis, chronic 12/13/2007  . URINARY TRACT INFECTION SITE NOT SPECIFIED 04/04/2007  . BOILS, RECURRENT 11/14/2006  . HIV disease (HCC) 09/12/2006  . PANIC ATTACK 09/12/2006  . Essential hypertension 09/12/2006  . 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 CARDIAC CATHETERIZATION    . CORONARY ANGIOPLASTY WITH STENT PLACEMENT  08/24/2016  . CORONARY STENT INTERVENTION N/A 08/24/2016   Procedure: Coronary Stent Intervention;  Surgeon: Iran Ouch, MD;  [facility];  Service: Cardiovascular;  Laterality: N/A;  . HAND NERVE REPAIR Right 1990s?   "caught it in a machine; had to be reconstructed"  . JOINT REPLACEMENT    . LEFT HEART CATH AND CORONARY ANGIOGRAPHY N/A 08/24/2016   Procedure: Left Heart Cath and Coronary Angiography;  Surgeon: Iran Ouch, MD;  [facility];  Service: Cardiovascular;  Laterality: N/A;  . TOTAL HIP ARTHROPLASTY Right   . TOTAL HIP ARTHROPLASTY Left 07/05/2016   Procedure: TOTAL HIP ARTHROPLASTY ANTERIOR APPROACH;  Surgeon: Marcene Corning, MD;  [facility];  Service: Orthopedics;  Laterality: Left;       Home Medications    Prior to Admission medications   Medication Sig Start Date End Date Taking? Authorizing Provider  amLODipine (NORVASC) 5 MG tablet TAKE 1 TABLET BY MOUTH EVERY DAY 07/10/17   Daiva Eves, [place],  MD  aspirin EC 81 MG EC tablet Take 1 tablet (81 mg total) by mouth daily. 08/25/16   Arty Baumgartneroberts, Lindsay B, NP  cetirizine (ZYRTEC) 10 MG tablet Take 1 tablet (10 mg total) by mouth at bedtime. 11/17/16   Sherren MochaShaw, Eva N, MD  clopidogrel (PLAVIX) 75 MG tablet Take 1 tablet (75 mg total) by mouth daily. 08/25/16   Arty Baumgartneroberts, Lindsay B, NP  Darunavir-Cobicisctat-Emtricitabine-Tenofovir Alafenamide [facility](SYMTUZA) 800-150-200-10 MG TABS Take 1 tablet by mouth daily with breakfast. 06/05/17   Daiva EvesVan  Dam, Lisette Grinderornelius N, MD  methocarbamol (ROBAXIN) 500 MG tablet Take 1 tablet by mouth as needed. 08/04/16   [provider]  metoprolol tartrate (LOPRESSOR) 25 MG tablet Take 0.5 tablets (12.5 mg total) by mouth 2 (two) times daily. 08/25/16   Arty Baumgartneroberts, Lindsay B, NP  mometasone (NASONEX) 50 MCG/ACT nasal spray Place 2 sprays into the nose daily. Patient taking differently: Place 2 sprays into the nose daily as needed (allergies).  11/02/15   Sherren MochaShaw, Eva N, MD  moxifloxacin (VIGAMOX) 0.5 % ophthalmic solution Place 1 drop 4 (four) times daily into the right eye. Start tonight Patient not taking: Reported on 06/05/2017 03/22/17   Isa RankinMurray, Kamila Broda Wilson, MD  Multiple Vitamin (MULTIVITAMIN) tablet Take 1 tablet by mouth daily.    [provider]  nitroGLYCERIN (NITROSTAT) 0.4 MG SL tablet Place 1 tablet (0.4 mg total) under the tongue every 5 (five) minutes x 3 doses as needed for chest pain. 08/25/16   Arty Baumgartneroberts, Lindsay B, NP  oxyCODONE-acetaminophen (PERCOCET/ROXICET) 5-325 MG tablet Take 1 tablet by mouth daily as needed. 08/04/16   [provider]  predniSONE (DELTASONE) 50 MG tablet Take 1 tablet (50 mg total) by mouth daily. 07/14/17   Isa RankinMurray, Minyon Billiter Wilson, MD  ranitidine (ZANTAC) 150 MG tablet Take 1 tablet (150 mg total) by mouth 2 (two) times daily. 06/28/17 07/28/17  Wieters, Hallie C, PA-C  rosuvastatin (CRESTOR) 20 MG tablet Take 1 tablet (20 mg total) by mouth daily. 06/05/17   Randall HissVan Dam, Cornelius N, MD  sulfamethoxazole-trimethoprim (BACTRIM DS,SEPTRA DS) 800-160 MG tablet Take 1 tablet by mouth 2 (two) times daily for 7 days. 07/14/17 07/21/17  Isa RankinMurray, Bensen Chadderdon Wilson, MD  Vitamin D, Ergocalciferol, (DRISDOL) 50000 UNITS CAPS capsule Take 1 capsule (50,000 Units total) by mouth every 7 (seven) days. 04/08/13   Randall HissVan Dam, Cornelius N, MD    Family History History reviewed. No pertinent family history.  Social History Social History   Tobacco Use  . Smoking status: Former Smoker     Packs/day: 0.50    Years: 47.00    Pack years: 23.50    Types: Cigarettes  . Smokeless tobacco: Never Used  Substance Use Topics  . Alcohol use: Yes    Alcohol/week: 0.0 oz    Comment: 08/24/2016 "I'll have a couple drinks maybe 4 times/year"  . Drug use: Yes    Types: Marijuana    Comment: 08/24/2016 "nothing since my younger days"     Allergies   Ace inhibitors   Review of Systems Review of Systems  All other systems reviewed and are negative.    Physical Exam Triage Vital Signs ED Triage Vitals [07/14/17 1913]  Enc Vitals Group     BP 116/69     Pulse Rate 83     Resp 18     Temp 98.4 F (36.9 C)     Temp Source Oral     SpO2 96 %     Weight  Height      Pain Score 5     Pain Loc    Updated Vital Signs BP 116/69 (BP Location: Right Arm)   Pulse 83   Temp 98.4 F (36.9 C) (Oral)   Resp 18   SpO2 96%  Physical Exam  Constitutional: He is oriented to person, place, and time. No distress.  Alert, nicely groomed  HENT:  Head: Atraumatic.  Eyes:  Conjugate gaze, no eye redness/drainage  Neck: Neck supple.  Cardiovascular: Normal rate.  Pulmonary/Chest: No respiratory distress.  Abdominal: He exhibits no distension.  Musculoskeletal: Normal range of motion.  Neurological: He is alert and oriented to person, place, and time.  Skin: Skin is warm and dry.  No cyanosis 1 inch x 0.5 inch tender tense swelling just to the right of the central lower lip, pointing at the vermilion border.  The entire central lower lip is quite swollen.  Nursing note and vitals reviewed.    UC Treatments / Results   Procedures Incision and Drainage Date/Time: 07/14/2017 8:43 PM Performed by: Isa Rankin, MD Authorized by: Isa Rankin, MD   Consent:    Consent obtained:  Verbal   Consent given by:  Patient   Risks discussed:  Incomplete drainage and pain Location:    Type:  Abscess   Size:  1 inch x 0.5 inch   Location:  Mouth   Mouth location:  Lower lip. Pre-procedure details:    Skin preparation:  Hibiclens Anesthesia (see MAR for exact dosages):    Anesthesia method: Ethyl chloride spray; patient did not tolerate efforts to inject 1% lidocaine. Procedure type:    Complexity:  Simple Procedure details:    Incision types:  Stab incision   Incision depth:  Dermal   Scalpel blade:  11   Techniques: Expressed a couple of cc of purulent material.   Wound treatment:  Wound left open   Packing materials:  None Post-procedure details:    Patient tolerance of procedure:  Tolerated well, no immediate complications    Final Clinical Impressions(s) / UC Diagnoses   Final diagnoses:  Abscess   Wash gently with soap/water 1-2 times daily, apply antibiotic ointment to keep from scabbing up. Prescriptions for trimethoprim/sulfa (antibiotic) and prednisone (steroid, to reduce pain/swelling) were sent to the pharmacy.  Recheck if any increasing redness/swelling/pain/drainage or new fever>100.5, or if not starting to improve in a few days.    ED Discharge Orders        Ordered    sulfamethoxazole-trimethoprim (BACTRIM DS,SEPTRA DS) 800-160 MG tablet  2 times daily     07/14/17 2036    predniSONE (DELTASONE) 50 MG tablet  Daily     07/14/17 2036        Isa Rankin, MD 07/16/17 2218

## 2017-07-14 NOTE — Discharge Instructions (Addendum)
Wash gently with soap/water 1-2 times daily, apply antibiotic ointment to keep from scabbing up. Prescriptions for trimethoprim/sulfa (antibiotic) and prednisone (steroid, to reduce pain/swelling) were sent to the pharmacy.  Recheck if any increasing redness/swelling/pain/drainage or new fever>100.5, or if not starting to improve in a few days.

## 2017-07-17 ENCOUNTER — Encounter: Payer: Self-pay | Admitting: Infectious Disease

## 2017-07-25 ENCOUNTER — Telehealth: Payer: Self-pay | Admitting: *Deleted

## 2017-07-25 NOTE — Telephone Encounter (Signed)
Patient walked into clinic complaining of severe weight loss. He feels he has lost 30 pounds in 1 month. He wants to be sure it is not the new pill symtuza.  He does not feel comfortable waiting 6 months to see Dr Daiva EvesVan Dam, states he cannot see PCP Dr Renae GlossShelton because he is establishing new primary care on 4/3. Patient will come to [facility] for labs tomorrow, follow up with DR Daiva EvesVan Dam on 4/25 to make sure it is not his new medication.  RN encouraged patient to please follow up with PCP as he has multiple comorbidities that could be contributing to this rapid weight loss.  He refuses to see DR Renae GlossShelton, states he will wait until his intake with new PCP on 4/3, but promises to follow through with PCP appointment. Andree CossHowell, Curran Lenderman M, RN

## 2017-07-25 NOTE — Telephone Encounter (Signed)
There is NO WAY that this has ANYTHING to do with SYMTUZA. Symtuza is identical biologically to Prezcobix and Descovy which he was taking previously. I would worry very much that something else is going on.

## 2017-07-26 ENCOUNTER — Other Ambulatory Visit (HOSPITAL_COMMUNITY)
Admission: RE | Admit: 2017-07-26 | Discharge: 2017-07-26 | Disposition: A | Discharge status: Home / Self Care | Payer: Medicare Other | Source: Ambulatory Visit | Attending: Infectious Disease | Admitting: Infectious Disease

## 2017-07-26 ENCOUNTER — Other Ambulatory Visit: Payer: Medicare Other

## 2017-07-26 DIAGNOSIS — Z113 Encounter for screening for infections with a predominantly sexual mode of transmission: Secondary | ICD-10-CM

## 2017-07-26 DIAGNOSIS — M8700 Idiopathic aseptic necrosis of unspecified bone: Secondary | ICD-10-CM

## 2017-07-26 DIAGNOSIS — I214 Non-ST elevation (NSTEMI) myocardial infarction: Secondary | ICD-10-CM

## 2017-07-26 DIAGNOSIS — I10 Essential (primary) hypertension: Secondary | ICD-10-CM

## 2017-07-26 DIAGNOSIS — Z79899 Other long term (current) drug therapy: Secondary | ICD-10-CM

## 2017-07-26 DIAGNOSIS — B20 Human immunodeficiency virus [HIV] disease: Secondary | ICD-10-CM | POA: Diagnosis present

## 2017-07-26 DIAGNOSIS — F17200 Nicotine dependence, unspecified, uncomplicated: Secondary | ICD-10-CM

## 2017-07-26 DIAGNOSIS — I2510 Atherosclerotic heart disease of native coronary artery without angina pectoris: Secondary | ICD-10-CM

## 2017-07-27 ENCOUNTER — Other Ambulatory Visit: Payer: Self-pay

## 2017-07-27 ENCOUNTER — Ambulatory Visit (HOSPITAL_COMMUNITY)
Admission: EM | Admit: 2017-07-27 | Discharge: 2017-07-27 | Disposition: A | Discharge status: Home / Self Care | Payer: Medicare Other | Attending: Family Medicine | Admitting: Family Medicine

## 2017-07-27 ENCOUNTER — Encounter (HOSPITAL_COMMUNITY): Payer: Self-pay | Admitting: Emergency Medicine

## 2017-07-27 DIAGNOSIS — R739 Hyperglycemia, unspecified: Secondary | ICD-10-CM

## 2017-07-27 LAB — CBC WITH DIFFERENTIAL/PLATELET
BASOS ABS: 59 {cells}/uL (ref 0–200)
Basophils Relative: 1.2 %
Eosinophils Absolute: 172 cells/uL (ref 15–500)
Eosinophils Relative: 3.5 %
HEMATOCRIT: 38.1 % — AB (ref 38.5–50.0)
HEMOGLOBIN: 12.9 g/dL — AB (ref 13.2–17.1)
LYMPHS ABS: 1828 {cells}/uL (ref 850–3900)
MCH: 30.7 pg (ref 27.0–33.0)
MCHC: 33.9 g/dL (ref 32.0–36.0)
MCV: 90.7 fL (ref 80.0–100.0)
MPV: 11.9 fL (ref 7.5–12.5)
Monocytes Relative: 9.3 %
NEUTROS ABS: 2386 {cells}/uL (ref 1500–7800)
Neutrophils Relative %: 48.7 %
Platelets: 210 10*3/uL (ref 140–400)
RBC: 4.2 10*6/uL (ref 4.20–5.80)
RDW: 13.4 % (ref 11.0–15.0)
Total Lymphocyte: 37.3 %
WBC mixed population: 456 cells/uL (ref 200–950)
WBC: 4.9 10*3/uL (ref 3.8–10.8)

## 2017-07-27 LAB — COMPLETE METABOLIC PANEL WITH GFR
AG RATIO: 1.6 (calc) (ref 1.0–2.5)
ALBUMIN MSPROF: 4.4 g/dL (ref 3.6–5.1)
ALT: 16 U/L (ref 9–46)
AST: 15 U/L (ref 10–35)
Alkaline phosphatase (APISO): 105 U/L (ref 40–115)
BILIRUBIN TOTAL: 0.5 mg/dL (ref 0.2–1.2)
BUN: 13 mg/dL (ref 7–25)
CO2: 25 mmol/L (ref 20–32)
CREATININE: 1.14 mg/dL (ref 0.70–1.25)
Calcium: 9.7 mg/dL (ref 8.6–10.3)
Chloride: 95 mmol/L — ABNORMAL LOW (ref 98–110)
GFR, EST AFRICAN AMERICAN: 77 mL/min/{1.73_m2} (ref >=60)
GFR, Est Non African American: 67 mL/min/{1.73_m2} (ref >=60)
GLOBULIN: 2.7 g/dL (ref 1.9–3.7)
Glucose, Bld: 519 mg/dL (ref 65–99)
Potassium: 4.3 mmol/L (ref 3.5–5.3)
Sodium: 133 mmol/L — ABNORMAL LOW (ref 135–146)
Total Protein: 7.1 g/dL (ref 6.1–8.1)

## 2017-07-27 LAB — T-HELPER CELL (CD4) - (RCID CLINIC ONLY)
CD4 T CELL ABS: 470 /uL (ref 400–2700)
CD4 T CELL HELPER: 24 % — AB (ref 33–55)

## 2017-07-27 LAB — URINE CYTOLOGY ANCILLARY ONLY
Chlamydia: NEGATIVE
Neisseria Gonorrhea: NEGATIVE

## 2017-07-27 LAB — GLUCOSE, CAPILLARY: Glucose-Capillary: >600 mg/dL (ref 65–99)

## 2017-07-27 LAB — RPR: RPR Ser Ql: NONREACTIVE

## 2017-07-27 MED ORDER — INSULIN GLARGINE 100 UNIT/ML SOLOSTAR PEN: 10.0000 [IU] | PEN_INJECTOR | Freq: Every day | SUBCUTANEOUS | 0 refills | Status: AC

## 2017-07-27 MED ORDER — BLOOD GLUCOSE MONITOR KIT
PACK | 0 refills | Status: AC
Start: 2017-07-27 — End: ?

## 2017-07-27 MED ORDER — METFORMIN HCL 500 MG PO TABS: 500.0000 mg | ORAL_TABLET | Freq: Two times a day (BID) | ORAL | 0 refills | Status: DC

## 2017-07-27 NOTE — ED Triage Notes (Signed)
States he is pre-diabetic and is concerned about weight loss

## 2017-07-27 NOTE — Telephone Encounter (Signed)
Spoke with patient, asked him to get to PCP, Urgent Care or other provider today.  He states he is not taking anything for diabetes, believing he has been "borderline" but not diabetic. RN advised this may not be the case any longer.  Patient verbalized understanding, agreement.  He was glad this may likely be the cause of his weight loss.

## 2017-07-27 NOTE — Telephone Encounter (Signed)
Thanks so much Wayne Santos Wayne Santos he will need to go insulin I would suspect. His BG was quite high at last check as well and I suspect we asked him to see PCP then. Hopefully now he will.

## 2017-07-27 NOTE — Telephone Encounter (Signed)
Results in EPIC today.

## 2017-07-27 NOTE — Telephone Encounter (Signed)
His Diabetes is COMPLETELY out of control. THAT is PRECISELY why he is losing so much weight  I hope he is seeing PCP ASAP or going to an ED to get this taken care of BG was over 500

## 2017-07-27 NOTE — Discharge Instructions (Addendum)
Begin taking Metformin by mouth twice daily. Use the Lantus Solostar Insulin pen set to prescribed units (we're starting at 10) and use this nightly. If you are having trouble using the insulin pen please return so that we may assist you or ask the pharmacist at [facility]. I have given you a prescription to get a blood sugar monitoring kit. Check you blood sugar first thing in the morning before you eat and then 2 hours after each meals. Please return here in 2-3 days so that we may review and recheck your blood sugar levels.

## 2017-07-28 LAB — HIV-1 RNA QUANT-NO REFLEX-BLD
HIV 1 RNA Quant: <20 copies/mL
HIV-1 RNA Quant, Log: <1.3 Log copies/mL

## 2017-07-31 ENCOUNTER — Ambulatory Visit (HOSPITAL_COMMUNITY)
Admission: EM | Admit: 2017-07-31 | Discharge: 2017-07-31 | Disposition: A | Discharge status: Home / Self Care | Payer: Medicare Other | Attending: Urgent Care | Admitting: Urgent Care

## 2017-07-31 ENCOUNTER — Other Ambulatory Visit: Payer: Self-pay

## 2017-07-31 ENCOUNTER — Encounter (HOSPITAL_COMMUNITY): Payer: Self-pay | Admitting: Emergency Medicine

## 2017-07-31 DIAGNOSIS — E1165 Type 2 diabetes mellitus with hyperglycemia: Secondary | ICD-10-CM | POA: Diagnosis not present

## 2017-07-31 DIAGNOSIS — I519 Heart disease, unspecified: Secondary | ICD-10-CM

## 2017-07-31 DIAGNOSIS — I10 Essential (primary) hypertension: Secondary | ICD-10-CM

## 2017-07-31 DIAGNOSIS — I252 Old myocardial infarction: Secondary | ICD-10-CM

## 2017-07-31 DIAGNOSIS — B20 Human immunodeficiency virus [HIV] disease: Secondary | ICD-10-CM

## 2017-07-31 LAB — POCT URINALYSIS DIP (DEVICE)
BILIRUBIN URINE: NEGATIVE
Glucose, UA: 500 mg/dL — AB
HGB URINE DIPSTICK: NEGATIVE
Ketones, ur: 15 mg/dL — AB
Leukocytes, UA: NEGATIVE
NITRITE: NEGATIVE
PH: 5.5 (ref 5.0–8.0)
Protein, ur: NEGATIVE mg/dL
SPECIFIC GRAVITY, URINE: 1.015 (ref 1.005–1.030)
Urobilinogen, UA: 0.2 mg/dL (ref 0.0–1.0)

## 2017-07-31 LAB — GLUCOSE, CAPILLARY: Glucose-Capillary: 351 mg/dL — ABNORMAL HIGH (ref 65–99)

## 2017-07-31 NOTE — Discharge Instructions (Addendum)
Increase your Lantus dose to 20 units nightly. Increase your metformin to 2 pills (1,000mg ) in the morning and 1 pill (500mg ) at bedtime for the next week. After that, take 2 pills (1,000mg ) twice daily. Hydrate well with at least 2 liters (1 gallon) of water daily.    Salads - kale, spinach, cabbage, spring mix; use seeds like pumpkin seeds or sunflower seeds; you can also use 1-2 hard boiled eggs. Fruits - avocadoes, berries (blueberries, raspberries, blackberries), apples, oranges, pomegranate, grapefruit Seeds - quinoa, chia seeds; you can also incorporate oatmeal Vegetables - aspargus, cauliflower, broccoli, green beans, brussel spouts, bell peppers; stay away from starchy vegetables like potatoes, carrots, peas

## 2017-07-31 NOTE — ED Provider Notes (Signed)
MRN: [id number] DOB: 1951/02/25  Subjective:   Wayne Santos is a 67 y.o. male presenting for recheck on his diabetes. He is currently using 10 units of Lantus, 561m BID of metformin. Has an appointment with his PCP on 08/07/2017 but was advised to return to our clinic for a recheck today. Denies headache, dizziness, confusion, chest pain, n/v, abdominal pain, hematuria. Denies smoking cigarettes.  No current facility-administered medications for this encounter.   Current Outpatient Medications:  .  amLODipine (NORVASC) 5 MG tablet, TAKE 1 TABLET BY MOUTH EVERY DAY, Disp: 30 tablet, Rfl: 5 .  aspirin EC 81 MG EC tablet, Take 1 tablet (81 mg total) by mouth daily., Disp: , Rfl:  .  blood glucose meter kit and supplies KIT, Dispense based on patient and insurance preference. Use up to four times daily as directed. (FOR ICD-9 250.00, 250.01)., Disp: 1 each, Rfl: 0 .  cetirizine (ZYRTEC) 10 MG tablet, Take 1 tablet (10 mg total) by mouth at bedtime., Disp: 30 tablet, Rfl: 11 .  clopidogrel (PLAVIX) 75 MG tablet, Take 1 tablet (75 mg total) by mouth daily., Disp: 30 tablet, Rfl: 11 .  Darunavir-Cobicisctat-Emtricitabine-Tenofovir Alafenamide (SYMTUZA) 800-150-200-10 MG TABS, Take 1 tablet by mouth daily with breakfast., Disp: 30 tablet, Rfl: 11 .  Insulin Glargine (LANTUS) 100 UNIT/ML Solostar Pen, Inject 10 Units into the skin daily at 10 pm., Disp: 15 mL, Rfl: 0 .  metFORMIN (GLUCOPHAGE) 500 MG tablet, Take 1 tablet (500 mg total) by mouth 2 (two) times daily with a meal., Disp: 60 tablet, Rfl: 0 .  methocarbamol (ROBAXIN) 500 MG tablet, Take 1 tablet by mouth as needed., Disp: , Rfl: 0 .  metoprolol tartrate (LOPRESSOR) 25 MG tablet, Take 0.5 tablets (12.5 mg total) by mouth 2 (two) times daily., Disp: 60 tablet, Rfl: 3 .  mometasone (NASONEX) 50 MCG/ACT nasal spray, Place 2 sprays into the nose daily. (Patient taking differently: Place 2 sprays into the nose daily as needed (allergies). ),  Disp: 17 g, Rfl: 12 .  moxifloxacin (VIGAMOX) 0.5 % ophthalmic solution, Place 1 drop 4 (four) times daily into the right eye. Start tonight (Patient not taking: Reported on 06/05/2017), Disp: 3 mL, Rfl: 0 .  Multiple Vitamin (MULTIVITAMIN) tablet, Take 1 tablet by mouth daily., Disp: , Rfl:  .  nitroGLYCERIN (NITROSTAT) 0.4 MG SL tablet, Place 1 tablet (0.4 mg total) under the tongue every 5 (five) minutes x 3 doses as needed for chest pain., Disp: 25 tablet, Rfl: 3 .  oxyCODONE-acetaminophen (PERCOCET/ROXICET) 5-325 MG tablet, Take 1 tablet by mouth daily as needed., Disp: , Rfl:  .  predniSONE (DELTASONE) 50 MG tablet, Take 1 tablet (50 mg total) by mouth daily., Disp: 2 tablet, Rfl: 0 .  ranitidine (ZANTAC) 150 MG tablet, Take 1 tablet (150 mg total) by mouth 2 (two) times daily., Disp: 60 tablet, Rfl: 0 .  rosuvastatin (CRESTOR) 20 MG tablet, Take 1 tablet (20 mg total) by mouth daily., Disp: 30 tablet, Rfl: 11 .  Vitamin D, Ergocalciferol, (DRISDOL) 50000 UNITS CAPS capsule, Take 1 capsule (50,000 Units total) by mouth every 7 (seven) days., Disp: 8 capsule, Rfl: 0   Allergies  Allergen Reactions  . Ace Inhibitors Cough    Past Medical History:  Diagnosis Date  . Anemia   . AVN (avascular necrosis of bone) ([facility]   . Benign paroxysmal positional vertigo 06/15/2015  . CAD (coronary artery disease), native coronary artery    a. 08/24/16 NSTEMI/PCI: LM nl, LAD 40p,  D1 60ost, D2 80ost-small, LCX 98m(2.5x30 ONyx DES), OM1/2 min irregs, OM3 nl, RCA 30p/m, RPDA/RPAV/RPL1/RPL2/RPL3 nl, EF 55-65%.  . Childhood asthma   . GERD (gastroesophageal reflux disease)   . Glaucoma   . History of pneumonia    "4 times in my life" (08/24/2016)  . Hyperlipidemia   . Hypertension   . Memory impairment 06/15/2015  . Osteoporosis   . Primary localized osteoarthrosis of left hip 07/05/2016  . Seasonal allergies   . Tobacco abuse      Past Surgical History:  Procedure Laterality Date  . CARDIAC  CATHETERIZATION    . CORONARY ANGIOPLASTY WITH STENT PLACEMENT  08/24/2016  . CORONARY STENT INTERVENTION N/A 08/24/2016   Procedure: Coronary Stent Intervention;  Surgeon: MWellington Hampshire MD;  [facility];  Service: Cardiovascular;  Laterality: N/A;  . HAND NERVE REPAIR Right 1990s?   "caught it in a machine; had to be reconstructed"  . JOINT REPLACEMENT    . LEFT HEART CATH AND CORONARY ANGIOGRAPHY N/A 08/24/2016   Procedure: Left Heart Cath and Coronary Angiography;  Surgeon: MWellington Hampshire MD;  [facility];  Service: Cardiovascular;  Laterality: N/A;  . TOTAL HIP ARTHROPLASTY Right   . TOTAL HIP ARTHROPLASTY Left 07/05/2016   Procedure: TOTAL HIP ARTHROPLASTY ANTERIOR APPROACH;  Surgeon: PMelrose Nakayama MD;  [facility]  Service: Orthopedics;  Laterality: Left;    Objective:   Vitals: BP 107/62   Pulse 74   Temp 98.7 F (37.1 C) (Oral)   SpO2 99%   Physical Exam  Constitutional: He is oriented to person, place, and time. He appears well-developed and well-nourished.  HENT:  Mouth/Throat: Oropharynx is clear and moist.  Eyes: No scleral icterus.  Cardiovascular: Normal rate, regular rhythm and intact distal pulses. Exam reveals no gallop and no friction rub.  No murmur heard. Pulmonary/Chest: No respiratory distress. He has no wheezes. He has no rales.  Neurological: He is alert and oriented to person, place, and time.  Skin: Skin is warm and dry.  Psychiatric: He has a normal mood and affect.   Results for orders placed or performed during the hospital encounter of 07/31/17 (from the past 24 hour(s))  Glucose, capillary     Status: Abnormal   Collection Time: 07/31/17  6:20 PM  Result Value Ref Range   Glucose-Capillary 351 (H) 65 - 99 mg/dL  POCT urinalysis dip (device)     Status: Abnormal   Collection Time: 07/31/17  6:30 PM  Result Value Ref Range   Glucose, UA 500 (A) NEGATIVE mg/dL   Bilirubin Urine NEGATIVE NEGATIVE    Ketones, ur 15 (A) NEGATIVE mg/dL   Specific Gravity, Urine 1.015 1.005 - 1.030   Hgb urine dipstick NEGATIVE NEGATIVE   pH 5.5 5.0 - 8.0   Protein, ur NEGATIVE NEGATIVE mg/dL   Urobilinogen, UA 0.2 0.0 - 1.0 mg/dL   Nitrite NEGATIVE NEGATIVE   Leukocytes, UA NEGATIVE NEGATIVE   Assessment and Plan :   Uncontrolled type 2 diabetes mellitus with hyperglycemia (HCC)  HIV disease (HCC)  Heart disease  History of MI (myocardial infarction)  Essential hypertension  Will have patient increase to 20 units of Lantus at bedtime prn. Titration dosing for metformin up to 1,0050mBID provided. Counseled patient on potential for adverse effects with medications prescribed today, patient verbalized understanding. Discussed diabetic friendly diet, encouraged lifestyle modifications. Return-to-clinic precautions discussed, patient verbalized understanding. Otherwise f/u with PCP.   MaJaynee EaglesPA-C 07/31/17  2142

## 2017-07-31 NOTE — ED Provider Notes (Signed)
[place]   [id number] 07/27/17 Arrival Time: 0177  ASSESSMENT & PLAN:  1. Hyperglycemia     Meds ordered this encounter  Medications  . Insulin Glargine (LANTUS) 100 UNIT/ML Solostar Pen    Sig: Inject 10 Units into the skin daily at 10 pm.    Dispense:  15 mL    Refill:  0  . metFORMIN (GLUCOPHAGE) 500 MG tablet    Sig: Take 1 tablet (500 mg total) by mouth 2 (two) times daily with a meal.    Dispense:  60 tablet    Refill:  0  . blood glucose meter kit and supplies KIT    Sig: Dispense based on patient and insurance preference. Use up to four times daily as directed. (FOR ICD-9 250.00, 250.01).    Dispense:  1 each    Refill:  0    Order Specific Question:   Number of strips    Answer:   100    Order Specific Question:   Number of lancets    Answer:   100   Will f/u here on 07/31/2017. Discussed importance of establishing care with a PCP.  Reviewed expectations re: course of current medical issues. Questions answered. Outlined signs and symptoms indicating need for more acute intervention. Patient verbalized understanding. After Visit Summary given.   SUBJECTIVE: History from: patient. Wayne Santos is a 67 y.o. male who has been told in the past that he may be pre-diabetic presents with complaint of weight loss over the past few months. No n/v. Labs drawn by his ID doctor revealed "very high blood sugar". "Was told to come here." Also reports urinary frequency. Vision normal. No extremity sensation changes or weakness. Appetite normal. No abdominal pain or bowel/bladder habit changes reported.  FH of diabetes.  ROS: As per HPI. All other systems negative.   OBJECTIVE:  Vitals:   07/27/17 1645  BP: 110/69  Pulse: 71  Temp: 98.1 F (36.7 C)  TempSrc: Oral  SpO2: 97%    General appearance: alert; no distress Eyes: PERRLA; EOMI; conjunctiva normal HENT: normocephalic; atraumatic Lungs: clear to auscultation bilaterally Heart: regular rate  and rhythm Abdomen: soft, non-tender; bowel sounds normal Extremities: no cyanosis or edema; symmetrical with no gross deformities Skin: warm and dry without rashes or lesions or wounds Neurologic: normal gait; normal symmetric reflexes Psychological: alert and cooperative; normal mood and affect  Labs: Labs Reviewed  GLUCOSE, CAPILLARY - Abnormal; Notable for the following components:      Result Value   Glucose-Capillary >600 (*)    All other components within normal limits    Allergies  Allergen Reactions  . Ace Inhibitors Cough    Past Medical History:  Diagnosis Date  . Anemia   . AVN (avascular necrosis of bone) ([place])   . Benign paroxysmal positional vertigo 06/15/2015  . CAD (coronary artery disease), native coronary artery    a. 08/24/16 NSTEMI/PCI: LM nl, LAD 40p, D1 60ost, D2 80ost-small, LCX 29m(2.5x30 ONyx DES), OM1/2 min irregs, OM3 nl, RCA 30p/m, RPDA/RPAV/RPL1/RPL2/RPL3 nl, EF 55-65%.  . Childhood asthma   . GERD (gastroesophageal reflux disease)   . Glaucoma   . History of pneumonia    "4 times in my life" (08/24/2016)  . Hyperlipidemia   . Hypertension   . Memory impairment 06/15/2015  . Osteoporosis   . Primary localized osteoarthrosis of left hip 07/05/2016  . Seasonal allergies   . Tobacco abuse    Social History   Socioeconomic History  .  Marital status: Single    Spouse name: Not on file  . Number of children: Not on file  . Years of education: Not on file  . Highest education level: Not on file  Occupational History  . Not on file  Social Needs  . Financial resource strain: Not on file  . Food insecurity:    Worry: Not on file    Inability: Not on file  . Transportation needs:    Medical: Not on file    Non-medical: Not on file  Tobacco Use  . Smoking status: Former Smoker    Packs/day: 0.50    Years: 47.00    Pack years: 23.50    Types: Cigarettes  . Smokeless tobacco: Never Used  Substance and Sexual Activity  . Alcohol use: Yes     Alcohol/week: 0.0 oz    Comment: 08/24/2016 "I'll have a couple drinks maybe 4 times/year"  . Drug use: Yes    Types: Marijuana    Comment: 08/24/2016 "nothing since my younger days"  . Sexual activity: Yes    Comment: declined condoms  Lifestyle  . Physical activity:    Days per week: Not on file    Minutes per session: Not on file  . Stress: Not on file  Relationships  . Social connections:    Talks on phone: Not on file    Gets together: Not on file    Attends religious service: Not on file    Active member of club or organization: Not on file    Attends meetings of clubs or organizations: Not on file    Relationship status: Not on file  . Intimate partner violence:    Fear of current or ex partner: Not on file    Emotionally abused: Not on file    Physically abused: Not on file    Forced sexual activity: Not on file  Other Topics Concern  . Not on file  Social History Narrative  . Not on file    Past Surgical History:  Procedure Laterality Date  . CARDIAC CATHETERIZATION    . CORONARY ANGIOPLASTY WITH STENT PLACEMENT  08/24/2016  . CORONARY STENT INTERVENTION N/A 08/24/2016   Procedure: Coronary Stent Intervention;  Surgeon: Wellington Hampshire, MD;  Location: [facility];  Service: Cardiovascular;  Laterality: N/A;  . HAND NERVE REPAIR Right 1990s?   "caught it in a machine; had to be reconstructed"  . JOINT REPLACEMENT    . LEFT HEART CATH AND CORONARY ANGIOGRAPHY N/A 08/24/2016   Procedure: Left Heart Cath and Coronary Angiography;  Surgeon: Wellington Hampshire, MD;  Location: [facility];  Service: Cardiovascular;  Laterality: N/A;  . TOTAL HIP ARTHROPLASTY Right   . TOTAL HIP ARTHROPLASTY Left 07/05/2016   Procedure: TOTAL HIP ARTHROPLASTY ANTERIOR APPROACH;  Surgeon: Melrose Nakayama, MD;  Location: [facility];  Service: Orthopedics;  Laterality: Left;     Vanessa Kick, MD 07/31/17 1312

## 2017-07-31 NOTE — ED Triage Notes (Signed)
Here for f/u for diabetes, medications taken from last OV

## 2017-08-10 ENCOUNTER — Ambulatory Visit: Payer: Medicare Other | Admitting: Cardiovascular Disease

## 2017-08-10 ENCOUNTER — Encounter: Payer: Self-pay | Admitting: Cardiovascular Disease

## 2017-08-10 DIAGNOSIS — E785 Hyperlipidemia, unspecified: Secondary | ICD-10-CM | POA: Diagnosis not present

## 2017-08-10 DIAGNOSIS — B20 Human immunodeficiency virus [HIV] disease: Secondary | ICD-10-CM

## 2017-08-10 DIAGNOSIS — I2510 Atherosclerotic heart disease of native coronary artery without angina pectoris: Secondary | ICD-10-CM | POA: Diagnosis not present

## 2017-08-10 DIAGNOSIS — E118 Type 2 diabetes mellitus with unspecified complications: Secondary | ICD-10-CM | POA: Diagnosis not present

## 2017-08-10 DIAGNOSIS — I214 Non-ST elevation (NSTEMI) myocardial infarction: Secondary | ICD-10-CM

## 2017-08-10 DIAGNOSIS — Z794 Long term (current) use of insulin: Secondary | ICD-10-CM | POA: Diagnosis not present

## 2017-08-10 DIAGNOSIS — I10 Essential (primary) hypertension: Secondary | ICD-10-CM

## 2017-08-10 NOTE — Patient Instructions (Signed)

## 2017-08-10 NOTE — Progress Notes (Signed)
Cardiology Office Note    Date:  08/10/2017   ID:  ADEEB KONECNY, DOB 1950/08/13, MRN [id number]  PCP:  Nolene Ebbs, MD  Cardiologist:  Shelva Majestic, MD   Chief Complaint  Patient presents with  . Follow-up    6 months    History of Present Illness:  Wayne Santos is a 67 y.o. male who presents to the office for a 6 month follow-up cardiology evaluation.  Wayne Santos is a 67 year old male who has a prior history of HIV, hypertension, hyperlipidemia, and presented to [facility] on 08/24/2016 with unstable angina.  Troponins were mildly positive at 0.27.  He underwent cardiac catheterization by Dr. Velva Harman and was found to have a 95% mid circumflex stenosis which was successfully stented with a 2.530 mm Resolute onyx DES stent.  He had mild concomitant CAD with 30% proximal and mid RCA stenoses, 60% diagonal 1, 80% diagonal 2 stenoses and small vessels with mild 40% proximal LAD narrowing, irregularity in the circumflex marginal branches.     He quit smoking on August 14, 2006.  He initially participated in cardiac rehab.  When I saw him for initial evaluation with me in October 2018 he was without recurrent anginal symptoms.  He was on amlodipine 5 mg in addition to metoprolol 12.5 twice a day.  He was also on pravastatin for hyperlipidemia.  He underwent subsequent laboratory in January 2019.  LDL was 113.  He was taken off pravastatin and switch to rosuvastatin 20 mg.  Over the last 2 months, he has lost approximately 50 pounds of weight.  He was diagnosed with an irrigant hyperglycemia and diabetes mellitus.  Hemoglobin A1c had increased almost 13.  He was started on insulin in addition to metformin which has since been titrated up to 1000 mg twice a day.  He continues to see Dr. Drucilla Schmidt with reference to his HIV.  He also established with new primary physician on  [place] today and laboratory was redrawn.  Denies recurrent anginal symptoms.  He denies PND orthopnea.  He presents  for evaluation.  Past Medical History:  Diagnosis Date  . Anemia   . AVN (avascular necrosis of bone) ([place])   . Benign paroxysmal positional vertigo 06/15/2015  . CAD (coronary artery disease), native coronary artery    a. 08/24/16 NSTEMI/PCI: LM nl, LAD 40p, D1 60ost, D2 80ost-small, LCX 68m(2.5x30 ONyx DES), OM1/2 min irregs, OM3 nl, RCA 30p/m, RPDA/RPAV/RPL1/RPL2/RPL3 nl, EF 55-65%.  . Childhood asthma   . GERD (gastroesophageal reflux disease)   . Glaucoma   . History of pneumonia    "4 times in my life" (08/24/2016)  . Hyperlipidemia   . Hypertension   . Memory impairment 06/15/2015  . Osteoporosis   . Primary localized osteoarthrosis of left hip 07/05/2016  . Seasonal allergies   . Tobacco abuse     Past Surgical History:  Procedure Laterality Date  . CARDIAC CATHETERIZATION    . CORONARY ANGIOPLASTY WITH STENT PLACEMENT  08/24/2016  . CORONARY STENT INTERVENTION N/A 08/24/2016   Procedure: Coronary Stent Intervention;  Surgeon: MWellington Hampshire MD;  [facility];  Service: Cardiovascular;  Laterality: N/A;  . HAND NERVE REPAIR Right 1990s?   "caught it in a machine; had to be reconstructed"  . JOINT REPLACEMENT    . LEFT HEART CATH AND CORONARY ANGIOGRAPHY N/A 08/24/2016   Procedure: Left Heart Cath and Coronary Angiography;  Surgeon: MWellington Hampshire MD;  [facility];  Service: Cardiovascular;  Laterality: N/A;  . TOTAL HIP ARTHROPLASTY Right   . TOTAL HIP ARTHROPLASTY Left 07/05/2016   Procedure: TOTAL HIP ARTHROPLASTY ANTERIOR APPROACH;  Surgeon: Melrose Nakayama, MD;  Location: [facility];  Service: Orthopedics;  Laterality: Left;    Current Medications: Outpatient Medications Prior to Visit  Medication Sig Dispense Refill  . amLODipine (NORVASC) 5 MG tablet TAKE 1 TABLET BY MOUTH EVERY DAY 30 tablet 5  . aspirin EC 81 MG EC tablet Take 1 tablet (81 mg total) by mouth daily.    . blood glucose meter kit and supplies KIT Dispense based on  patient and insurance preference. Use up to four times daily as directed. (FOR ICD-9 250.00, 250.01). 1 each 0  . clopidogrel (PLAVIX) 75 MG tablet Take 1 tablet (75 mg total) by mouth daily. 30 tablet 11  . Darunavir-Cobicisctat-Emtricitabine-Tenofovir Alafenamide (SYMTUZA) 800-150-200-10 MG TABS Take 1 tablet by mouth daily with breakfast. 30 tablet 11  . Insulin Glargine (LANTUS) 100 UNIT/ML Solostar Pen Inject 10 Units into the skin daily at 10 pm. (Patient taking differently: Inject 20 Units into the skin at bedtime. ) 15 mL 0  . metFORMIN (GLUCOPHAGE) 500 MG tablet Take 1 tablet (500 mg total) by mouth 2 (two) times daily with a meal. (Patient taking differently: Take 1,000 mg by mouth 2 (two) times daily with a meal. ) 60 tablet 0  . Multiple Vitamin (MULTIVITAMIN) tablet Take 1 tablet by mouth daily.    . nitroGLYCERIN (NITROSTAT) 0.4 MG SL tablet Place 1 tablet (0.4 mg total) under the tongue every 5 (five) minutes x 3 doses as needed for chest pain. 25 tablet 3  . oxyCODONE-acetaminophen (PERCOCET/ROXICET) 5-325 MG tablet Take 1 tablet by mouth daily as needed.    . rosuvastatin (CRESTOR) 20 MG tablet Take 1 tablet (20 mg total) by mouth daily. 30 tablet 11  . Vitamin D, Ergocalciferol, (DRISDOL) 50000 UNITS CAPS capsule Take 1 capsule (50,000 Units total) by mouth every 7 (seven) days. 8 capsule 0  . cetirizine (ZYRTEC) 10 MG tablet Take 1 tablet (10 mg total) by mouth at bedtime. 30 tablet 11  . methocarbamol (ROBAXIN) 500 MG tablet Take 1 tablet by mouth as needed.  0  . metoprolol tartrate (LOPRESSOR) 25 MG tablet Take 0.5 tablets (12.5 mg total) by mouth 2 (two) times daily. 60 tablet 3  . ranitidine (ZANTAC) 150 MG tablet Take 1 tablet (150 mg total) by mouth 2 (two) times daily. 60 tablet 0   No facility-administered medications prior to visit.      Allergies:   Ace inhibitors   Social History   Socioeconomic History  . Marital status: Single    Spouse name: Not on file  .  Number of children: Not on file  . Years of education: Not on file  . Highest education level: Not on file  Occupational History  . Not on file  Social Needs  . Financial resource strain: Not on file  . Food insecurity:    Worry: Not on file    Inability: Not on file  . Transportation needs:    Medical: Not on file    Non-medical: Not on file  Tobacco Use  . Smoking status: Former Smoker    Packs/day: 0.50    Years: 47.00    Pack years: 23.50    Types: Cigarettes  . Smokeless tobacco: Never Used  Substance and Sexual Activity  . Alcohol use: Yes    Alcohol/week: 0.0 oz  Comment: 08/24/2016 "I'll have a couple drinks maybe 4 times/year"  . Drug use: Yes    Types: Marijuana    Comment: 08/24/2016 "nothing since my younger days"  . Sexual activity: Yes    Comment: declined condoms  Lifestyle  . Physical activity:    Days per week: Not on file    Minutes per session: Not on file  . Stress: Not on file  Relationships  . Social connections:    Talks on phone: Not on file    Gets together: Not on file    Attends religious service: Not on file    Active member of club or organization: Not on file    Attends meetings of clubs or organizations: Not on file    Relationship status: Not on file  Other Topics Concern  . Not on file  Social History Narrative  . Not on file     Family History:  The patient's family history is not on file.   He admits to some family history of CVD.  ROS General: Negative; No fevers, chills, or night sweats;  HEENT: Negative; No changes in vision or hearing, sinus congestion, difficulty swallowing Pulmonary: Negative; No cough, wheezing, shortness of breath, hemoptysis Cardiovascular:  GI: Negative; No nausea, vomiting, diarrhea, or abdominal pain GU: Negative; No dysuria, hematuria, or difficulty voiding Musculoskeletal: Negative; no myalgias, joint pain, or weakness Hematologic/Oncology: Negative; no easy bruising, bleeding Infectious  disease: Positive for HIV since the late 1980s or early 1990s Endocrine: Negative; no heat/cold intolerance; no diabetes Neuro: Negative; no changes in balance, headaches Skin: Negative; No rashes or skin lesions Psychiatric: Negative; No behavioral problems, depression Sleep: Negative; No snoring, daytime sleepiness, hypersomnolence, bruxism, restless legs, hypnogognic hallucinations, no cataplexy Other comprehensive 14 point system review is negative.   PHYSICAL EXAM:   VS:  BP 114/60 (BP Location: Left Arm, Patient Position: Sitting, Cuff Size: Normal)   Pulse 73   Ht _0  (1.753 m)   Wt 160 lb (72.6 kg)   BMI 23.63 kg/m     Repeat blood pressure by me was 122/70  Wt Readings from Last 3 Encounters:  08/10/17 160 lb (72.6 kg)  06/05/17 178 lb (80.7 kg)  01/11/17 192 lb (87.1 kg)    General: Alert, oriented, no distress.  Skin: normal turgor, no rashes, warm and dry HEENT: Normocephalic, atraumatic. Pupils equal round and reactive to light; sclera anicteric; extraocular muscles intact;  Nose without nasal septal hypertrophy Mouth/Parynx benign; Mallinpatti scale 3 Neck: No JVD, no carotid bruits; normal carotid upstroke Lungs: clear to ausculatation and percussion; no wheezing or rales Chest wall: without tenderness to palpitation Heart: PMI not displaced, RRR, s1 s2 normal, 1/6 systolic murmur, no diastolic murmur, no rubs, gallops, thrills, or heaves Abdomen: soft, nontender; no hepatosplenomehaly, BS+; abdominal aorta nontender and not dilated by palpation. Back: no CVA tenderness Pulses 2+ Musculoskeletal: full range of motion, normal strength, no joint deformities Extremities: no clubbing cyanosis or edema, Homan's sign negative  Neurologic: grossly nonfocal; Cranial nerves grossly wnl Psychologic: Normal mood and affect   Studies/Labs Reviewed:   EKG:  EKG is ordered today.  ECG (independently read by me): Normal sinus rhythm at 73 bpm.  No ectopy.  Normal  intervals.  August 2018 ECG (independently read by me): Sinus bradycardia 53 bpm.  Nonspecific T-wave abnormality.  Normal intervals.  No ectopy.  Recent Labs: BMP Latest Ref Rng & Units 07/26/2017 05/17/2017 08/25/2016  Glucose 65 - 99 mg/dL 519(HH) 491(H) 96  BUN  7 - 25 mg/dL _0 Creatinine 0.70 - 1.25 mg/dL 1.14 1.18 0.88  BUN/Creat Ratio 6 - 22 (calc) NOT APPLICABLE NOT APPLICABLE -  Sodium 158 - 146 mmol/L 133(L) 134(L) 139  Potassium 3.5 - 5.3 mmol/L 4.3 3.8 3.7  Chloride 98 - 110 mmol/L 95(L) 98 108  CO2 20 - 32 mmol/L _1 Calcium 8.6 - 10.3 mg/dL 9.7 9.5 9.1     Hepatic Function Latest Ref Rng & Units 07/26/2017 05/17/2017 08/23/2016  Total Protein 6.1 - 8.1 g/dL 7.1 7.6 6.7  Albumin 3.5 - 5.0 g/dL - - 3.7  AST 10 - 35 U/L _2 ALT 9 - 46 U/L _3 Alk Phosphatase 38 - 126 U/L - - 106  Total Bilirubin 0.2 - 1.2 mg/dL 0.5 0.5 0.3  Bilirubin, Direct - - - -    CBC Latest Ref Rng & Units 07/26/2017 05/17/2017 08/25/2016  WBC 3.8 - 10.8 Thousand/uL 4.9 6.1 7.4  Hemoglobin 13.2 - 17.1 g/dL 12.9(L) 13.3 12.5(L)  Hematocrit 38.5 - 50.0 % 38.1(L) 38.1(L) 38.0(L)  Platelets 140 - 400 Thousand/uL 210 231 306   Lab Results  Component Value Date   MCV 90.7 07/26/2017   MCV 87.4 05/17/2017   MCV 90.3 08/25/2016   Lab Results  Component Value Date   TSH 0.788 12/21/2009   Lab Results  Component Value Date   HGBA1C 6.1 (H) 08/24/2016     BNP No results found for: BNP  ProBNP No results found for: PROBNP   Lipid Panel     Component Value Date/Time   CHOL 201 (H) 05/17/2017 1549   TRIG 252 (H) 05/17/2017 1549   HDL 30 (L) 05/17/2017 1549   CHOLHDL 6.7 (H) 05/17/2017 1549   VLDL 25 08/24/2016 0648   LDLCALC 132 (H) 05/17/2017 1549   LDLDIRECT 113 (H) 06/05/2017 1624     RADIOLOGY: No results found.   Additional studies/ records that were reviewed today include:  I reviewed his hospital records from his 2018 admission.  I reviewed the  catheterization report and subsequent office visit. Recent laboratory was reviewed.   ASSESSMENT:    1. CAD in native artery   2. Non-ST elevation myocardial infarction (NSTEMI), subsequent episode of care ([place])   3. Hyperlipidemia LDL goal <70   4. Essential hypertension   5. Type 2 diabetes mellitus with complication, with long-term current use of insulin ([facility]ne Santos)   6. History of HIV infection Wayne Santos)      PLAN:  Wayne Santos is a 67 year old American male who has a ~30 year history of HIV which has been well treated with undetectable virus.  He has a history of hypertension, hyperlipidemia, and tobacco use in this week and will be 1 year since he quit smoking following his  non-STEMI. He underwent successful stenting of a 95% mid circumflex stenosis with a Resolute DES stent.  He had mild concomitant CAD as noted above.  He has been without anginal symptomatology since his intervention.  He was recently diagnosed with diabetes mellitus and he tells me serum glucose was as high as 800.  Hemoglobin A1c was close to 13.  He had noted to polydipsia, poly-urea, and polyphagia.  His weight had dropped to 150 pounds.  He is now back up to 160 pounds but when I last saw him in October he weighed 190 pounds.  His most recent LDL had increased to 113 and he now is on rosuvastatin 20  mg for hyperlipidemia.  Target LDL is less than 70.  Repeat laboratory will need to be obtained.  He tells me he established primary care with a new physician today and laboratory was drawn but this was not fasting.  He is now on insulin and metformin for his diabetes.  He is on vitamin D replacement for his previously documented vitamin D deficiency.  He is followed closely by Dr. Tommy Medal.  I have recommended that he can return to work.  Clinically he is stable from a cardiovascular standpoint.  I will see him in 1 year for reevaluation or sooner if recurrent symptoms develop.   Medication Adjustments/Labs and Tests  Ordered: Current medicines are reviewed at length with the patient today.  Concerns regarding medicines are outlined above.  Medication changes, Labs and Tests ordered today are listed in the Patient Instructions below. Patient Instructions  Medication Instructions:  Your physician recommends that you continue on your current medications as directed. Please refer to the Current Medication list given to you today.  Follow-Up: Your physician wants you to follow-up in: 12 months with Dr. Claiborne Billings.  You will receive a reminder letter in the mail two months in advance. If you don't receive a letter, please call our office to schedule the follow-up appointment.   Any Other Special Instructions Will Be Listed Below (If Applicable).     If you need a refill on your cardiac medications before your next appointment, please call your pharmacy.     Signed, Shelva Majestic, MD, [facility]  08/10/2017 5:56 PM    [facility] [place] [address], [place], [place], [place]  [address] Phone: [phone]

## 2017-08-31 ENCOUNTER — Encounter: Payer: Self-pay | Admitting: Infectious Disease

## 2017-08-31 ENCOUNTER — Ambulatory Visit (INDEPENDENT_AMBULATORY_CARE_PROVIDER_SITE_OTHER): Payer: Medicare Other | Admitting: Infectious Disease

## 2017-08-31 VITALS — BP 117/68 | HR 80 | Temp 97.9°F | Ht 69.0 in | Wt 162.0 lb

## 2017-08-31 DIAGNOSIS — E1065 Type 1 diabetes mellitus with hyperglycemia: Secondary | ICD-10-CM | POA: Diagnosis not present

## 2017-08-31 DIAGNOSIS — I2510 Atherosclerotic heart disease of native coronary artery without angina pectoris: Secondary | ICD-10-CM

## 2017-08-31 DIAGNOSIS — Z23 Encounter for immunization: Secondary | ICD-10-CM | POA: Diagnosis not present

## 2017-08-31 DIAGNOSIS — M8700 Idiopathic aseptic necrosis of unspecified bone: Secondary | ICD-10-CM

## 2017-08-31 DIAGNOSIS — B20 Human immunodeficiency virus [HIV] disease: Secondary | ICD-10-CM | POA: Diagnosis not present

## 2017-08-31 NOTE — Progress Notes (Signed)
Chief complaint: followup for HIV on medications   Subjective:    Patient ID: Wayne Santos, male    DOB: May 16, 1950, 67 y.o.   MRN: [id number]  HPI  Wayne Santos is a 67 y.o. male who is doing superbly well on his  antiviral regimen, SYMTUZA     Lab Results  Component Value Date   HIV1RNAQUANT <20 NOT DETECTED 07/26/2017   HIV1RNAQUANT <20 NOT DETECTED 05/17/2017   HIV1RNAQUANT <20 (H) 01/11/2017   Lab Results  Component Value Date   CD4TABS 470 07/26/2017   CD4TABS 690 05/17/2017   CD4TABS 970 01/11/2017    Sonia Side developed diabetes mellitus and had lost a tremendous amount of weight due to his uncontrolled diabetes.  He is now in care and his blood sugars which he shows me today are much more reasonable range and his weight that he lost his coming back to him.    Past Medical History:  Diagnosis Date  . Anemia   . AVN (avascular necrosis of bone) ([place])   . Benign paroxysmal positional vertigo 06/15/2015  . CAD (coronary artery disease), native coronary artery    a. 08/24/16 NSTEMI/PCI: LM nl, LAD 40p, D1 60ost, D2 80ost-small, LCX 89m(2.5x30 ONyx DES), OM1/2 min irregs, OM3 nl, RCA 30p/m, RPDA/RPAV/RPL1/RPL2/RPL3 nl, EF 55-65%.  . Childhood asthma   . GERD (gastroesophageal reflux disease)   . Glaucoma   . History of pneumonia    "4 times in my life" (08/24/2016)  . Hyperlipidemia   . Hypertension   . Memory impairment 06/15/2015  . Osteoporosis   . Primary localized osteoarthrosis of left hip 07/05/2016  . Seasonal allergies   . Tobacco abuse     Past Surgical History:  Procedure Laterality Date  . CARDIAC CATHETERIZATION    . CORONARY ANGIOPLASTY WITH STENT PLACEMENT  08/24/2016  . CORONARY STENT INTERVENTION N/A 08/24/2016   Procedure: Coronary Stent Intervention;  Surgeon: MWellington Hampshire MD;  [facility];  Service: Cardiovascular;  Laterality: N/A;  . HAND NERVE REPAIR Right 1990s?   "caught it in a machine; had to be reconstructed"    . JOINT REPLACEMENT    . LEFT HEART CATH AND CORONARY ANGIOGRAPHY N/A 08/24/2016   Procedure: Left Heart Cath and Coronary Angiography;  Surgeon: MWellington Hampshire MD;  [facility];  Service: Cardiovascular;  Laterality: N/A;  . TOTAL HIP ARTHROPLASTY Right   . TOTAL HIP ARTHROPLASTY Left 07/05/2016   Procedure: TOTAL HIP ARTHROPLASTY ANTERIOR APPROACH;  Surgeon: PMelrose Nakayama MD;  [facility]  Service: Orthopedics;  Laterality: Left;    No family history on file.    Social History   Socioeconomic History  . Marital status: Single    Spouse name: Not on file  . Number of children: Not on file  . Years of education: Not on file  . Highest education level: Not on file  Occupational History  . Not on file  Social Needs  . Financial resource strain: Not on file  . Food insecurity:    Worry: Not on file    Inability: Not on file  . Transportation needs:    Medical: Not on file    Non-medical: Not on file  Tobacco Use  . Smoking status: Former Smoker    Packs/day: 0.50    Years: 47.00    Pack years: 23.50    Types: Cigarettes  . Smokeless tobacco: Never Used  Substance and Sexual Activity  . Alcohol use:  Yes    Alcohol/week: 0.0 oz    Comment: 08/24/2016 "I'll have a couple drinks maybe 4 times/year"  . Drug use: Yes    Types: Marijuana    Comment: 08/24/2016 "nothing since my younger days"  . Sexual activity: Yes    Comment: declined condoms  Lifestyle  . Physical activity:    Days per week: Not on file    Minutes per session: Not on file  . Stress: Not on file  Relationships  . Social connections:    Talks on phone: Not on file    Gets together: Not on file    Attends religious service: Not on file    Active member of club or organization: Not on file    Attends meetings of clubs or organizations: Not on file    Relationship status: Not on file  Other Topics Concern  . Not on file  Social History Narrative  . Not on file    Allergies   Allergen Reactions  . Ace Inhibitors Cough     Current Outpatient Medications:  .  amLODipine (NORVASC) 5 MG tablet, TAKE 1 TABLET BY MOUTH EVERY DAY, Disp: 30 tablet, Rfl: 5 .  aspirin EC 81 MG EC tablet, Take 1 tablet (81 mg total) by mouth daily., Disp: , Rfl:  .  blood glucose meter kit and supplies KIT, Dispense based on patient and insurance preference. Use up to four times daily as directed. (FOR ICD-9 250.00, 250.01)., Disp: 1 each, Rfl: 0 .  clopidogrel (PLAVIX) 75 MG tablet, Take 1 tablet (75 mg total) by mouth daily., Disp: 30 tablet, Rfl: 11 .  Darunavir-Cobicisctat-Emtricitabine-Tenofovir Alafenamide (SYMTUZA) 800-150-200-10 MG TABS, Take 1 tablet by mouth daily with breakfast., Disp: 30 tablet, Rfl: 11 .  Insulin Glargine (LANTUS) 100 UNIT/ML Solostar Pen, Inject 10 Units into the skin daily at 10 pm. (Patient taking differently: Inject 20 Units into the skin at bedtime. ), Disp: 15 mL, Rfl: 0 .  metFORMIN (GLUCOPHAGE) 500 MG tablet, Take 1 tablet (500 mg total) by mouth 2 (two) times daily with a meal. (Patient taking differently: Take 1,000 mg by mouth 2 (two) times daily with a meal. ), Disp: 60 tablet, Rfl: 0 .  Multiple Vitamin (MULTIVITAMIN) tablet, Take 1 tablet by mouth daily., Disp: , Rfl:  .  nitroGLYCERIN (NITROSTAT) 0.4 MG SL tablet, Place 1 tablet (0.4 mg total) under the tongue every 5 (five) minutes x 3 doses as needed for chest pain., Disp: 25 tablet, Rfl: 3 .  oxyCODONE-acetaminophen (PERCOCET/ROXICET) 5-325 MG tablet, Take 1 tablet by mouth daily as needed., Disp: , Rfl:  .  rosuvastatin (CRESTOR) 20 MG tablet, Take 1 tablet (20 mg total) by mouth daily., Disp: 30 tablet, Rfl: 11 .  Vitamin D, Ergocalciferol, (DRISDOL) 50000 UNITS CAPS capsule, Take 1 capsule (50,000 Units total) by mouth every 7 (seven) days., Disp: 8 capsule, Rfl: 0 .  ranitidine (ZANTAC) 150 MG tablet, Take 1 tablet (150 mg total) by mouth 2 (two) times daily., Disp: 60 tablet, Rfl:  0     Review of Systems  Constitutional: Negative for activity change, appetite change, chills, diaphoresis, fatigue, fever and unexpected weight change.  HENT: Negative for congestion, rhinorrhea, sinus pressure, sneezing, sore throat and trouble swallowing.   Eyes: Negative for photophobia and visual disturbance.  Respiratory: Negative for cough, chest tightness, shortness of breath, wheezing and stridor.   Cardiovascular: Negative for chest pain, palpitations and leg swelling.  Gastrointestinal: Negative for abdominal distention, abdominal pain, anal bleeding,  blood in stool, constipation, diarrhea, nausea and vomiting.  Genitourinary: Negative for difficulty urinating, dysuria, flank pain and hematuria.  Musculoskeletal: Negative for arthralgias, back pain, gait problem, joint swelling and myalgias.  Skin: Negative for color change, pallor, rash and wound.  Neurological: Negative for dizziness, tremors, weakness and light-headedness.  Hematological: Negative for adenopathy. Does not bruise/bleed easily.  Psychiatric/Behavioral: Negative for agitation, behavioral problems, confusion, decreased concentration, dysphoric mood and sleep disturbance.       Objective:   Physical Exam  Constitutional: He is oriented to person, place, and time. He appears well-developed and well-nourished.  HENT:  Head: Normocephalic and atraumatic.  Eyes: Conjunctivae and EOM are normal.  Neck: Normal range of motion. Neck supple.  Cardiovascular: Normal rate and regular rhythm.  Pulmonary/Chest: Effort normal. No respiratory distress. He has no wheezes.  Abdominal: Soft. He exhibits no distension.  Musculoskeletal: Normal range of motion. He exhibits no edema or tenderness.  Neurological: He is alert and oriented to person, place, and time.  Skin: Skin is warm and dry. No rash noted. No erythema. No pallor.  Psychiatric: He has a normal mood and affect. His behavior is normal. Judgment and thought  content normal.  Nursing note and vitals reviewed.         Assessment & Plan:  HIV disease: Continue SYMTUZA.  Apparently it is being covered by Medicare plus SPAP  Previously uncontrolled diabetes mellitus seems to be coming under control with his primary care physician following him closely. CAD: needs to continue plavix, asa, beta blocker and I will intensity his statin therapy with crestor 20 mg  HTN: continue norvasc  And beta blocker, gauge her primary care.  AVN: sp bilateral THA  Hyperlipidemia: onj crestor

## 2017-11-21 ENCOUNTER — Other Ambulatory Visit: Payer: Medicare Other

## 2017-11-21 DIAGNOSIS — B20 Human immunodeficiency virus [HIV] disease: Secondary | ICD-10-CM

## 2017-11-22 LAB — COMPLETE METABOLIC PANEL WITH GFR
AG Ratio: 1.6 (calc) (ref 1.0–2.5)
ALBUMIN MSPROF: 4.5 g/dL (ref 3.6–5.1)
ALT: 9 U/L (ref 9–46)
AST: 12 U/L (ref 10–35)
Alkaline phosphatase (APISO): 75 U/L (ref 40–115)
BUN: 12 mg/dL (ref 7–25)
CALCIUM: 10.3 mg/dL (ref 8.6–10.3)
CO2: 27 mmol/L (ref 20–32)
CREATININE: 1 mg/dL (ref 0.70–1.25)
Chloride: 105 mmol/L (ref 98–110)
GFR, EST NON AFRICAN AMERICAN: 78 mL/min/{1.73_m2} (ref >=60)
GFR, Est African American: 90 mL/min/{1.73_m2} (ref >=60)
GLOBULIN: 2.9 g/dL (ref 1.9–3.7)
Glucose, Bld: 104 mg/dL — ABNORMAL HIGH (ref 65–99)
Potassium: 4 mmol/L (ref 3.5–5.3)
SODIUM: 143 mmol/L (ref 135–146)
TOTAL PROTEIN: 7.4 g/dL (ref 6.1–8.1)
Total Bilirubin: 0.3 mg/dL (ref 0.2–1.2)

## 2017-11-22 LAB — CBC WITH DIFFERENTIAL/PLATELET
Basophils Absolute: 84 cells/uL (ref 0–200)
Basophils Relative: 1.1 %
EOS ABS: 654 {cells}/uL — AB (ref 15–500)
Eosinophils Relative: 8.6 %
HEMATOCRIT: 38 % — AB (ref 38.5–50.0)
Hemoglobin: 12.7 g/dL — ABNORMAL LOW (ref 13.2–17.1)
LYMPHS ABS: 2979 {cells}/uL (ref 850–3900)
MCH: 29.8 pg (ref 27.0–33.0)
MCHC: 33.4 g/dL (ref 32.0–36.0)
MCV: 89.2 fL (ref 80.0–100.0)
MPV: 10.4 fL (ref 7.5–12.5)
Monocytes Relative: 6.2 %
NEUTROS PCT: 44.9 %
Neutro Abs: 3412 cells/uL (ref 1500–7800)
PLATELETS: 298 10*3/uL (ref 140–400)
RBC: 4.26 10*6/uL (ref 4.20–5.80)
RDW: 12.3 % (ref 11.0–15.0)
TOTAL LYMPHOCYTE: 39.2 %
WBC: 7.6 10*3/uL (ref 3.8–10.8)
WBCMIX: 471 {cells}/uL (ref 200–950)

## 2017-11-22 LAB — T-HELPER CELL (CD4) - (RCID CLINIC ONLY)
CD4 T CELL ABS: 770 /uL (ref 400–2700)
CD4 T CELL HELPER: 27 % — AB (ref 33–55)

## 2017-11-22 LAB — RPR: RPR: NONREACTIVE

## 2017-11-23 LAB — HIV-1 RNA QUANT-NO REFLEX-BLD
HIV 1 RNA Quant: <20 copies/mL
HIV-1 RNA Quant, Log: <1.3 Log copies/mL

## 2017-12-05 ENCOUNTER — Ambulatory Visit: Payer: Medicare Other | Admitting: Infectious Disease

## 2017-12-06 ENCOUNTER — Encounter: Payer: Self-pay | Admitting: Family

## 2017-12-06 ENCOUNTER — Ambulatory Visit (INDEPENDENT_AMBULATORY_CARE_PROVIDER_SITE_OTHER): Payer: Medicare Other | Admitting: Family

## 2017-12-06 VITALS — BP 130/85 | HR 78 | Temp 97.9°F | Wt 173.4 lb

## 2017-12-06 DIAGNOSIS — I10 Essential (primary) hypertension: Secondary | ICD-10-CM

## 2017-12-06 DIAGNOSIS — B20 Human immunodeficiency virus [HIV] disease: Secondary | ICD-10-CM | POA: Diagnosis not present

## 2017-12-06 NOTE — Assessment & Plan Note (Signed)
Blood pressure appears adequately controlled with current medication regimen and no adverse side effects. Continue current dosage of amlodipine.

## 2017-12-06 NOTE — Patient Instructions (Signed)
Nice to meet you.  Please continue to take your Symtuza as prescribed.   Plan for follow up in 6 months or sooner if needed with blood work 1-2 weeks prior to the appointment.  Schedule a time in September to receive your flu shot.

## 2017-12-06 NOTE — Progress Notes (Signed)
Subjective:    Patient ID: Wayne Santos, male    DOB: February 08, 1951, 67 y.o.   MRN: [id number]  Chief Complaint  Patient presents with  . HIV Positive/AIDS    HPI:  Wayne Santos is a 67 y.o. male who presents today for routine follow up of HIV disease.   Wayne Santos was last seen in the office on 08/31/17 with well controlled HIV disease and maintained on Symtuza at that time. Most recent blood work completed on 11/21/17 with a viral load that is undetectable and his CD4 count of 770. RPR was nonreactive. Kidney function, liver function, electrolytes are within the normal ranges. All of his immunizations are up-to-date per recommendations.  Wayne Santos has been taking his medication as prescribed with no adverse side effects. He has had no missed doses and has no problems obtaining or taking his medication. Denies fevers, chills, night sweats, headaches, changes in vision, neck pain/stiffness, nausea, diarrhea, vomiting, lesions or rashes. He states that his diabetes has been better controlled.    Allergies  Allergen Reactions  . Ace Inhibitors Cough      Outpatient Medications Prior to Visit  Medication Sig Dispense Refill  . amLODipine (NORVASC) 5 MG tablet TAKE 1 TABLET BY MOUTH EVERY DAY 30 tablet 5  . aspirin EC 81 MG EC tablet Take 1 tablet (81 mg total) by mouth daily.    . blood glucose meter kit and supplies KIT Dispense based on patient and insurance preference. Use up to four times daily as directed. (FOR ICD-9 250.00, 250.01). 1 each 0  . Darunavir-Cobicisctat-Emtricitabine-Tenofovir Alafenamide (SYMTUZA) 800-150-200-10 MG TABS Take 1 tablet by mouth daily with breakfast. 30 tablet 11  . Insulin Glargine (LANTUS) 100 UNIT/ML Solostar Pen Inject 10 Units into the skin daily at 10 pm. (Patient taking differently: Inject 20 Units into the skin at bedtime. ) 15 mL 0  . metFORMIN (GLUCOPHAGE) 500 MG tablet Take 1 tablet (500 mg total) by mouth 2 (two) times daily with a  meal. (Patient taking differently: Take 1,000 mg by mouth 2 (two) times daily with a meal. ) 60 tablet 0  . Multiple Vitamin (MULTIVITAMIN) tablet Take 1 tablet by mouth daily.    . nitroGLYCERIN (NITROSTAT) 0.4 MG SL tablet Place 1 tablet (0.4 mg total) under the tongue every 5 (five) minutes x 3 doses as needed for chest pain. 25 tablet 3  . rosuvastatin (CRESTOR) 20 MG tablet Take 1 tablet (20 mg total) by mouth daily. 30 tablet 11  . Vitamin D, Ergocalciferol, (DRISDOL) 50000 UNITS CAPS capsule Take 1 capsule (50,000 Units total) by mouth every 7 (seven) days. 8 capsule 0  . clopidogrel (PLAVIX) 75 MG tablet Take 1 tablet (75 mg total) by mouth daily. (Patient not taking: Reported on 12/06/2017) 30 tablet 11  . oxyCODONE-acetaminophen (PERCOCET/ROXICET) 5-325 MG tablet Take 1 tablet by mouth daily as needed.    . ranitidine (ZANTAC) 150 MG tablet Take 1 tablet (150 mg total) by mouth 2 (two) times daily. 60 tablet 0   No facility-administered medications prior to visit.      Past Medical History:  Diagnosis Date  . Anemia   . AVN (avascular necrosis of bone) ([place])   . Benign paroxysmal positional vertigo 06/15/2015  . CAD (coronary artery disease), native coronary artery    a. 08/24/16 NSTEMI/PCI: LM nl, LAD 40p, D1 60ost, D2 80ost-small, LCX 27m(2.5x30 ONyx DES), OM1/2 min irregs, OM3 nl, RCA 30p/m, RPDA/RPAV/RPL1/RPL2/RPL3 nl, EF 55-65%.  .[place] Kitchen  Childhood asthma   . GERD (gastroesophageal reflux disease)   . Glaucoma   . History of pneumonia    "4 times in my life" (08/24/2016)  . Hyperlipidemia   . Hypertension   . Memory impairment 06/15/2015  . Osteoporosis   . Primary localized osteoarthrosis of left hip 07/05/2016  . Seasonal allergies   . Tobacco abuse      Past Surgical History:  Procedure Laterality Date  . CARDIAC CATHETERIZATION    . CORONARY ANGIOPLASTY WITH STENT PLACEMENT  08/24/2016  . CORONARY STENT INTERVENTION N/A 08/24/2016   Procedure: Coronary Stent Intervention;   Surgeon: Wellington Hampshire, MD;  Location: [facility];  Service: Cardiovascular;  Laterality: N/A;  . HAND NERVE REPAIR Right 1990s?   "caught it in a machine; had to be reconstructed"  . JOINT REPLACEMENT    . LEFT HEART CATH AND CORONARY ANGIOGRAPHY N/A 08/24/2016   Procedure: Left Heart Cath and Coronary Angiography;  Surgeon: Wellington Hampshire, MD;  Location: [facility];  Service: Cardiovascular;  Laterality: N/A;  . TOTAL HIP ARTHROPLASTY Right   . TOTAL HIP ARTHROPLASTY Left 07/05/2016   Procedure: TOTAL HIP ARTHROPLASTY ANTERIOR APPROACH;  Surgeon: Melrose Nakayama, MD;  Location: [facility];  Service: Orthopedics;  Laterality: Left;       Review of Systems  Constitutional: Negative for appetite change, chills, fatigue, fever and unexpected weight change.  Eyes: Negative for visual disturbance.  Respiratory: Negative for cough, chest tightness, shortness of breath and wheezing.   Cardiovascular: Negative for chest pain and leg swelling.  Gastrointestinal: Negative for abdominal pain, constipation, diarrhea, nausea and vomiting.  Genitourinary: Negative for dysuria, flank pain, frequency, genital sores, hematuria and urgency.  Skin: Negative for rash.  Allergic/Immunologic: Negative for immunocompromised state.  Neurological: Negative for dizziness and headaches.      Objective:    BP 130/85   Pulse 78   Temp 97.9 F (36.6 C)   Wt 173 lb 6.4 oz (78.7 kg)   BMI 25.61 kg/m  Nursing note and vital signs reviewed.  Physical Exam  Constitutional: He is oriented to person, place, and time. He appears well-developed. No distress.  HENT:  Mouth/Throat: Oropharynx is clear and moist.  Eyes: Conjunctivae are normal.  Neck: Neck supple.  Cardiovascular: Normal rate, regular rhythm, normal heart sounds and intact distal pulses. Exam reveals no gallop and no friction rub.  No murmur heard. Pulmonary/Chest: Effort normal and breath sounds normal. No respiratory distress. He  has no wheezes. He has no rales. He exhibits no tenderness.  Abdominal: Soft. Bowel sounds are normal. There is no tenderness.  Lymphadenopathy:    He has no cervical adenopathy.  Neurological: He is alert and oriented to person, place, and time.  Skin: Skin is warm and dry. No rash noted.  Psychiatric: He has a normal mood and affect. His behavior is normal. Judgment and thought content normal.       Assessment & Plan:   Problem List Items Addressed This Visit      Cardiovascular and Mediastinum   Essential hypertension    Blood pressure appears adequately controlled with current medication regimen and no adverse side effects. Continue current dosage of amlodipine.        Other   HIV disease ([place]) - Primary    Mr. Yordy has well-controlled HIV disease with a viral load that is undetectable and a CD4 count of 770. He is adherent to his medication regimen with no adverse side effects.  He has no problems obtaining his medications. He has no signs/symptoms of opportunistic infection/progressing HIV disease through history of physical exam. All immunizations are up-to-date per recommendations. Continue current dose of Symtuza. Encouraged to schedule a nurse visit in September to receive flu shot. Follow up office visit in 6 months or sooner if needed with blood work 1-2 weeks prior to appointment.      Relevant Orders   T-helper cell (CD4)- ([facility] only)   HIV 1 RNA quant-no reflex-bld   CBC   Comprehensive metabolic panel   Lipid panel   RPR       I am having [place] Blas maintain his Vitamin D (Ergocalciferol), aspirin, clopidogrel, nitroGLYCERIN, oxyCODONE-acetaminophen, multivitamin, rosuvastatin, Darunavir-Cobicisctat-Emtricitabine-Tenofovir Alafenamide, ranitidine, amLODipine, Insulin Glargine, metFORMIN, and blood glucose meter kit and supplies.   Follow-up: Return in about 6 months (around 06/08/2018), or if symptoms worsen or fail to improve.   Terri Piedra,  MSN, FNP-C Nurse Practitioner [facility] Office phone: [phone] Pager: [place] number: [phone]

## 2017-12-06 NOTE — Assessment & Plan Note (Signed)
Mr. Ardeth PerfectMcCleave has well-controlled HIV disease with a viral load that is undetectable and a CD4 count of 770. He is adherent to his medication regimen with no adverse side effects. He has no problems obtaining his medications. He has no signs/symptoms of opportunistic infection/progressing HIV disease through history of physical exam. All immunizations are up-to-date per recommendations. Continue current dose of Symtuza. Encouraged to schedule a nurse visit in September to receive flu shot. Follow up office visit in 6 months or sooner if needed with blood work 1-2 weeks prior to appointment.

## 2017-12-15 ENCOUNTER — Encounter: Payer: Self-pay | Admitting: Infectious Disease

## 2017-12-30 IMAGING — DX DG CHEST 2V
2 series · 2 of 2 positions shown · non-contrast
Comparison: Chest radiograph dated 08/23/2016

CLINICAL DATA: 66-year-old male with fever and cough.

EXAM:
CHEST  2 VIEW

[chest pa]
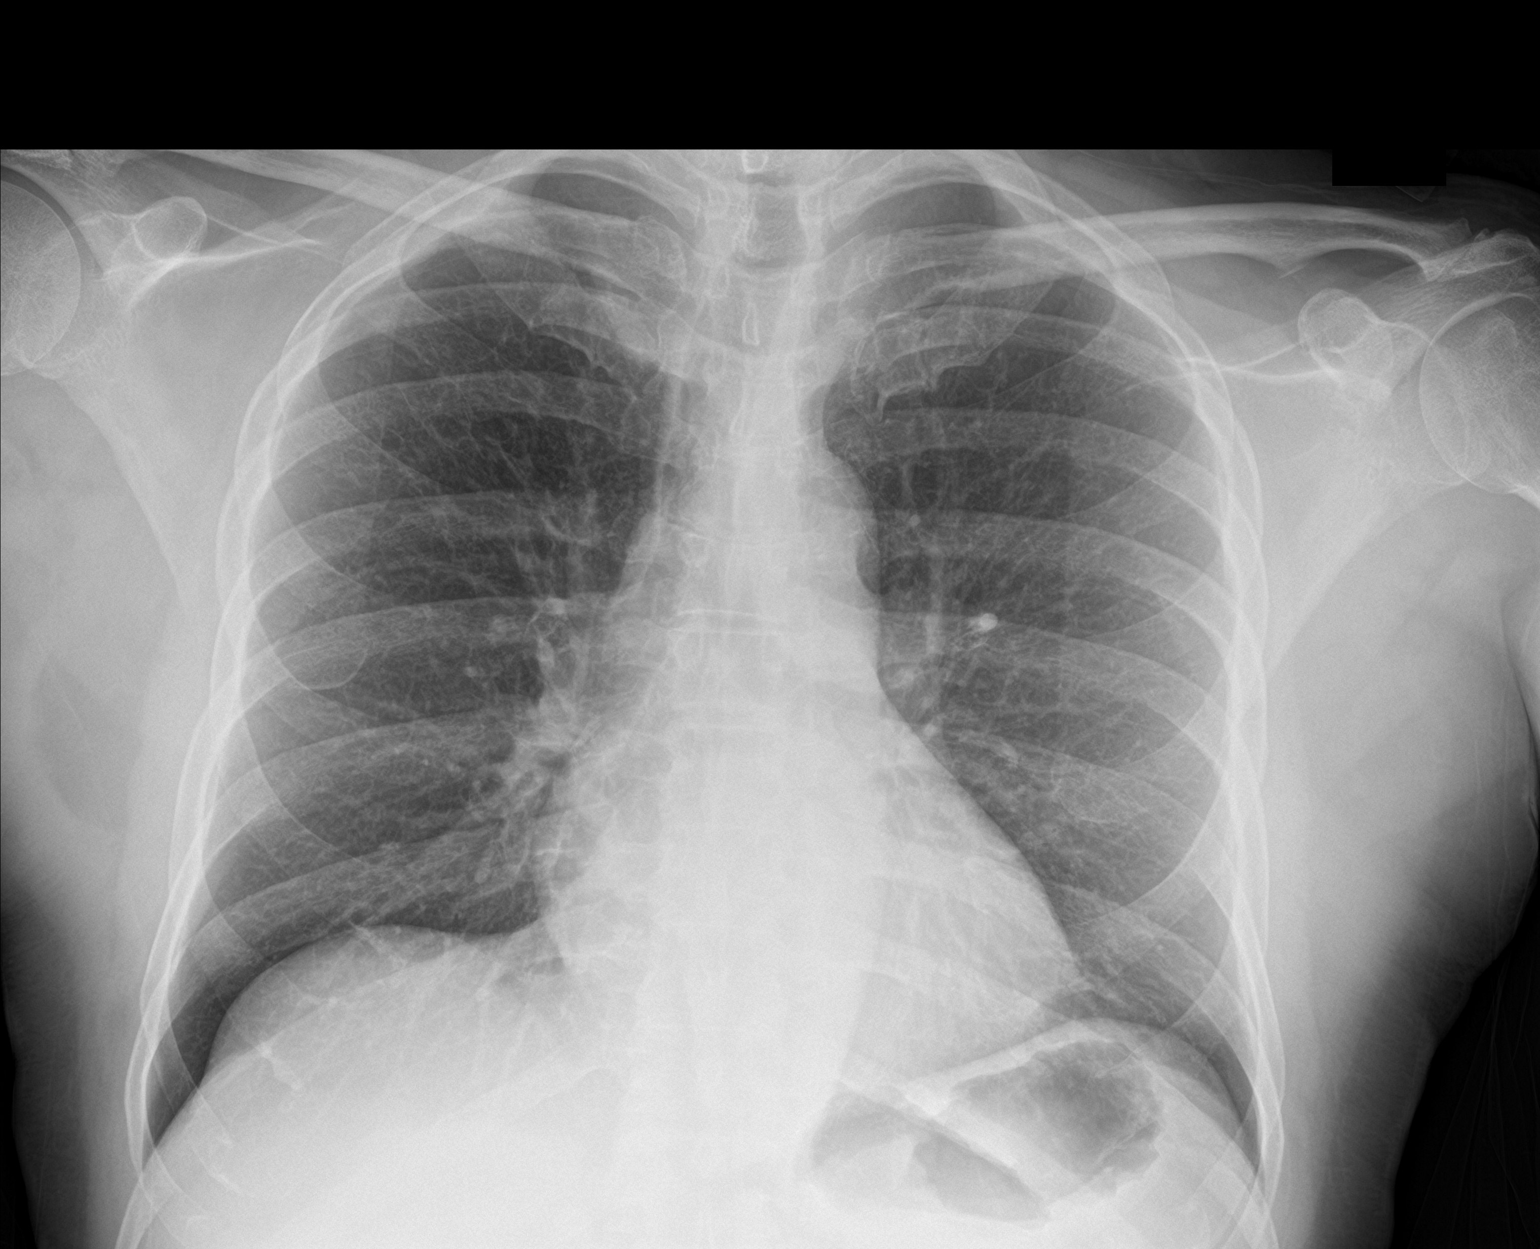

[chest lat]
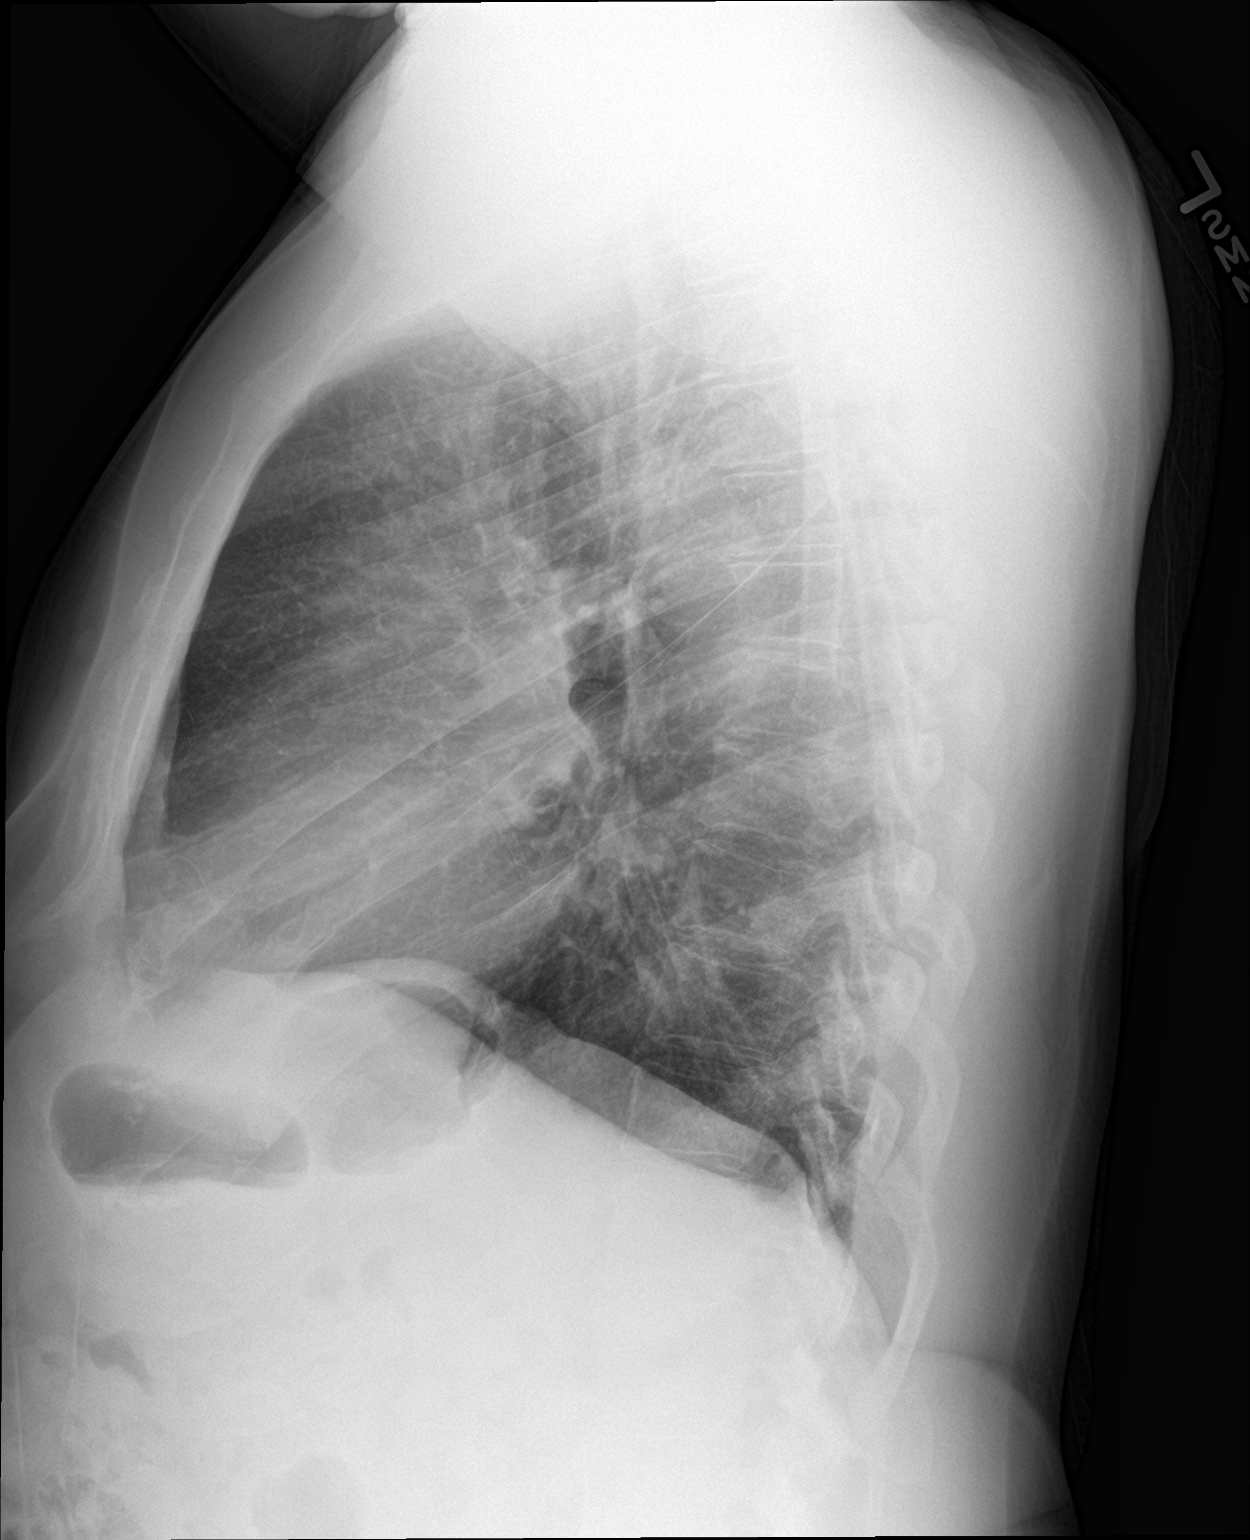

[2 of 2 positions shown; findings below may reference images not displayed]

FINDINGS: The heart size and mediastinal contours are within normal limits.
Both lungs are clear. The visualized skeletal structures are
unremarkable.
IMPRESSION: No active cardiopulmonary disease.

## 2018-01-04 ENCOUNTER — Ambulatory Visit (HOSPITAL_COMMUNITY)
Admission: EM | Admit: 2018-01-04 | Discharge: 2018-01-04 | Disposition: A | Discharge status: Home / Self Care | Payer: Medicare Other | Attending: Family Medicine | Admitting: Family Medicine

## 2018-01-04 ENCOUNTER — Encounter (HOSPITAL_COMMUNITY): Payer: Self-pay | Admitting: Emergency Medicine

## 2018-01-04 ENCOUNTER — Other Ambulatory Visit: Payer: Self-pay | Admitting: Infectious Disease

## 2018-01-04 DIAGNOSIS — I10 Essential (primary) hypertension: Secondary | ICD-10-CM

## 2018-01-04 DIAGNOSIS — H16001 Unspecified corneal ulcer, right eye: Secondary | ICD-10-CM

## 2018-01-04 MED ORDER — EYE WASH OPHTH SOLN
OPHTHALMIC | Status: AC
  Filled 2018-01-04: qty 118

## 2018-01-04 MED ORDER — ERYTHROMYCIN 5 MG/GM OP OINT: 1.0000 "application " | TOPICAL_OINTMENT | Freq: Four times a day (QID) | OPHTHALMIC | 0 refills | Status: AC

## 2018-01-04 NOTE — ED Triage Notes (Signed)
Pt states it feels like something is stuck in his eye, he was at work and something blew into his eye. Pt states it feels "gritty'. Pt R eye is red and draining.

## 2018-01-04 NOTE — Discharge Instructions (Signed)
Please start the antibiotic ointment as provided.  Do not replace your contacts until told to do so by your ophthalmologist.  Please follow up for a recheck with your eye doctor, or eye doctor provided, in the next 1-2 days for more thorough evaluation.  If develop increased pain, redness, or loss of vision please go to the Er.

## 2018-01-04 NOTE — ED Provider Notes (Signed)
[facility]    CSN: [id number] Arrival date & time: 01/04/18  1024     History   Chief Complaint Chief Complaint  Patient presents with  . Eye Problem    HPI Wayne Santos is a 67 y.o. male.   Wayne Santos presents with complaints of right redness and irritation since yesterday. States he was blowing when what he things was a bug got into his eye. Then he set the blower down and felt like dust/particles blew into the eye. He rubbed at his eye which did not help. It has felt irritated and like "grit" in the eye ever since. Light sensitivity. He wears daily contacts. States last night was unable to get his contact removed therefore he slept in them, which he does not typically do. He does have glasses but they are not his updated prescription. Eye was red this morning. Has had tearing but no purulent drainage. Feels like his vision is cloudy. Glaucoma listed on PMH, patient is uncertain of this, states he has been found to have cataracts. No fevers. No specific eye ball pain, but irritation sensation. Hx of avascular necrosis, CAD, gerd, glaucoma, hyperlipidemia, htn, osteoporosis, nstemi, DM, HIV. He was driven here.    ROS per HPI.      Past Medical History:  Diagnosis Date  . Anemia   . AVN (avascular necrosis of bone) ([place])   . Benign paroxysmal positional vertigo 06/15/2015  . CAD (coronary artery disease), native coronary artery    a. 08/24/16 NSTEMI/PCI: LM nl, LAD 40p, D1 60ost, D2 80ost-small, LCX 41m(2.5x30 ONyx DES), OM1/2 min irregs, OM3 nl, RCA 30p/m, RPDA/RPAV/RPL1/RPL2/RPL3 nl, EF 55-65%.  . Childhood asthma   . GERD (gastroesophageal reflux disease)   . Glaucoma   . History of pneumonia    "4 times in my life" (08/24/2016)  . Hyperlipidemia   . Hypertension   . Memory impairment 06/15/2015  . Osteoporosis   . Primary localized osteoarthrosis of left hip 07/05/2016  . Seasonal allergies   . Tobacco abuse     Patient Active Problem List   Diagnosis Date  Noted  . CAD (coronary artery disease), native coronary artery 01/11/2017  . NSTEMI (non-ST elevated myocardial infarction) ([facility] 08/24/2016  . Primary localized osteoarthrosis of left hip 07/05/2016  . Primary osteoarthritis of left hip 07/05/2016  . Encounter for long-term (current) use of medications 05/18/2016  . Routine screening for STI (sexually transmitted infection) 05/18/2016  . Benign paroxysmal positional vertigo 06/15/2015  . Memory impairment 06/15/2015  . Benign essential HTN 06/02/2014  . Type 1 diabetes mellitus with hyperglycemia ([facility] 02/04/2013  . AV[place] (avascular necrosis of bone) ([facility]   . Glaucoma   . Hyperlipidemia 03/16/2011  . ONYCHOMYCOSIS, TOENAILS 03/24/2010  . SMOKER 03/24/2010  . 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 POSTOPERATIVE INFECTION 07/10/2008  . Sinusitis, chronic 12/13/2007  . URINARY TRACT INFECTION SITE NOT SPECIFIED 04/04/2007  . BOILS, RECURRENT 11/14/2006  . HIV disease ([facility] 09/12/2006  . PANIC ATTACK 09/12/2006  . Essential hypertension 09/12/2006  . Aseptic necrosis of bone ([facility] 09/12/2006    Past Surgical History:  Procedure Laterality Date  . CARDIAC CATHETERIZATION    . CORONARY ANGIOPLASTY WITH STENT PLACEMENT  08/24/2016  . CORONARY STENT INTERVENTION N/A 08/24/2016   Procedure: Coronary Stent Intervention;  Surgeon: MWellington Hampshire MD;  [facility];  Service: Cardiovascular;  Laterality: N/A;  . HAND NERVE REPAIR Right 1990s?   "caught it in a machine; had to be  reconstructed"  . JOINT REPLACEMENT    . LEFT HEART CATH AND CORONARY ANGIOGRAPHY N/A 08/24/2016   Procedure: Left Heart Cath and Coronary Angiography;  Surgeon: Wellington Hampshire, MD;  Location: [facility];  Service: Cardiovascular;  Laterality: N/A;  . TOTAL HIP ARTHROPLASTY Right   . TOTAL HIP ARTHROPLASTY Left 07/05/2016   Procedure: TOTAL HIP ARTHROPLASTY ANTERIOR APPROACH;  Surgeon: Melrose Nakayama, MD;  Location: [facility];  Service: Orthopedics;   Laterality: Left;       Home Medications    Prior to Admission medications   Medication Sig Start Date End Date Taking? Authorizing Provider  amLODipine (NORVASC) 5 MG tablet TAKE 1 TABLET BY MOUTH EVERY DAY 01/04/18   Tommy Medal, Lavell Islam, MD  aspirin EC 81 MG EC tablet Take 1 tablet (81 mg total) by mouth daily. 08/25/16   Cheryln Manly, NP  blood glucose meter kit and supplies KIT Dispense based on patient and insurance preference. Use up to four times daily as directed. (FOR ICD-9 250.00, 250.01). 07/27/17   Vanessa Kick, MD  Darunavir-Cobicisctat-Emtricitabine-Tenofovir Alafenamide [facility]) 800-150-200-10 MG TABS Take 1 tablet by mouth daily with breakfast. 06/05/17   Tommy Medal, Lavell Islam, MD  erythromycin ophthalmic ointment Place 1 application into the right eye 4 (four) times daily for 7 days. 01/04/18 01/11/18  Zigmund Gottron, NP  Insulin Glargine (LANTUS) 100 UNIT/ML Solostar Pen Inject 10 Units into the skin daily at 10 pm. Patient taking differently: Inject 20 Units into the skin at bedtime.  07/27/17   Vanessa Kick, MD  metFORMIN (GLUCOPHAGE) 500 MG tablet Take 1 tablet (500 mg total) by mouth 2 (two) times daily with a meal. Patient taking differently: Take 1,000 mg by mouth 2 (two) times daily with a meal.  07/27/17   Vanessa Kick, MD  Multiple Vitamin (MULTIVITAMIN) tablet Take 1 tablet by mouth daily.    [provider]  nitroGLYCERIN (NITROSTAT) 0.4 MG SL tablet Place 1 tablet (0.4 mg total) under the tongue every 5 (five) minutes x 3 doses as needed for chest pain. 08/25/16   Cheryln Manly, NP  oxyCODONE-acetaminophen (PERCOCET/ROXICET) 5-325 MG tablet Take 1 tablet by mouth daily as needed. 08/04/16   [provider]  ranitidine (ZANTAC) 150 MG tablet Take 1 tablet (150 mg total) by mouth 2 (two) times daily. 06/28/17 07/28/17  Wieters, Hallie C, PA-C  rosuvastatin (CRESTOR) 20 MG tablet Take 1 tablet (20 mg total) by mouth daily. 06/05/17   Truman Hayward, MD  Vitamin D, Ergocalciferol, (DRISDOL) 50000 UNITS CAPS capsule Take 1 capsule (50,000 Units total) by mouth every 7 (seven) days. 04/08/13   Truman Hayward, MD    Family History No family history on file.  Social History Social History   Tobacco Use  . Smoking status: Former Smoker    Packs/day: 0.50    Years: 47.00    Pack years: 23.50    Types: Cigarettes  . Smokeless tobacco: Never Used  Substance Use Topics  . Alcohol use: Yes    Alcohol/week: 0.0 standard drinks    Comment: 08/24/2016 "I'll have a couple drinks maybe 4 times/year"  . Drug use: Yes    Types: Marijuana    Comment: 08/24/2016 "nothing since my younger days"     Allergies   Ace inhibitors   Review of Systems Review of Systems   Physical Exam Triage Vital Signs ED Triage Vitals [01/04/18 1133]  Enc Vitals Group  BP 121/78     Pulse Rate 84     Resp 16     Temp 97.9 F (36.6 C)     Temp src      SpO2 97 %     Weight      Height      Head Circumference      Peak Flow      Pain Score      Pain Loc      Pain Edu?      Excl. in [place]?    No data found.  Updated Vital Signs BP 121/78   Pulse 84   Temp 97.9 F (36.6 C)   Resp 16   SpO2 97%   Visual Acuity Right Eye Distance:   Left Eye Distance:   Bilateral Distance:    Right Eye Near: R Near: (S) (pt is unable to see any of the letters) Left Eye Near:  L Near: 20/70 Bilateral Near:  20/200  Physical Exam  Constitutional: He is oriented to person, place, and time. He appears well-developed and well-nourished.  HENT:  Head: Normocephalic and atraumatic.  Right Ear: External ear normal.  Left Ear: External ear normal.  Mouth/Throat: Oropharynx is clear and moist.  Eyes: Pupils are equal, round, and reactive to light. EOM and lids are normal. Right conjunctiva is injected.  Slit lamp exam:      The right eye shows corneal abrasion, corneal ulcer, foreign body and fluorescein uptake.    Right eye with  redness to all of whites; noted small foreign body on contact, central above pupil; tetracaine placed followed by irrigation with eye wash,contact was easily removed and disposed of; patient with subjective improvement of discomfort immediately with removal of contact; large uptake of fluorescein to central eye overlying pupil, approximately 1-2 mm in diameter; no eye ball pain; no purulent drainage  Cardiovascular: Normal rate and regular rhythm.  Pulmonary/Chest: Effort normal and breath sounds normal.  Neurological: He is alert and oriented to person, place, and time.  Skin: Skin is warm and dry.     UC Treatments / Results  Labs (all labs ordered are listed, but only abnormal results are displayed) Labs Reviewed - No data to display  EKG None  Radiology No results found.  Procedures Procedures (including critical care time)  Medications Ordered in UC Medications - No data to display  Initial Impression / Assessment and Plan / UC Course  I have reviewed the triage vital signs and the nursing notes.  Pertinent labs & imaging results that were available during my care of the patient were reviewed by me and considered in my medical decision making (see chart for details).     Successful removal of contact which did appear to have a foreign body inbedded in it. Subjective improvement of symptoms with removal of contact. Quite large uptake to central eye. Concern for ulceration related to contact over night and foreign body. Erythromycin ointment provided and emphasized importance of avoiding contacts and close follow up with ophthalmology for recheck in the next 1-2 days. Patient verbalized understanding and agreeable to plan.   Final Clinical Impressions(s) / UC Diagnoses   Final diagnoses:  Corneal ulcer of right eye     Discharge Instructions     Please start the antibiotic ointment as provided.  Do not replace your contacts until told to do so by your ophthalmologist.    Please follow up for a recheck with your eye doctor, or eye doctor provided, in  the next 1-2 days for more thorough evaluation.  If develop increased pain, redness, or loss of vision please go to the Er.    ED Prescriptions    Medication Sig Dispense Auth. Provider   erythromycin ophthalmic ointment Place 1 application into the right eye 4 (four) times daily for 7 days. 28 g Augusto Gamble B, NP     Controlled Substance Prescriptions [place] Controlled Substance Registry consulted? Not Applicable   Zigmund Gottron, NP 01/04/18 1211

## 2018-01-16 ENCOUNTER — Encounter (HOSPITAL_COMMUNITY): Payer: Self-pay | Admitting: Emergency Medicine

## 2018-01-16 ENCOUNTER — Ambulatory Visit (HOSPITAL_COMMUNITY)
Admission: EM | Admit: 2018-01-16 | Discharge: 2018-01-16 | Disposition: A | Discharge status: Home / Self Care | Payer: Medicare Other | Attending: Family Medicine | Admitting: Family Medicine

## 2018-01-16 DIAGNOSIS — L609 Nail disorder, unspecified: Secondary | ICD-10-CM

## 2018-01-16 LAB — POCT I-STAT, CHEM 8
BUN: 11 mg/dL (ref 8–23)
CALCIUM ION: 1.25 mmol/L (ref 1.15–1.40)
Chloride: 103 mmol/L (ref 98–111)
Creatinine, Ser: 0.8 mg/dL (ref 0.61–1.24)
GLUCOSE: 112 mg/dL — AB (ref 70–99)
HCT: 38 % — ABNORMAL LOW (ref 39.0–52.0)
HEMOGLOBIN: 12.9 g/dL — AB (ref 13.0–17.0)
Potassium: 3.6 mmol/L (ref 3.5–5.1)
Sodium: 141 mmol/L (ref 135–145)
TCO2: 28 mmol/L (ref 22–32)

## 2018-01-16 NOTE — ED Triage Notes (Signed)
Pt feels like he may have circulation issue; pt sts discoloration of fingernails; pt noted to have polish on nails unsure of actual color; fingers and warm and denies pain

## 2018-01-25 NOTE — ED Provider Notes (Signed)
[facility]   [id number] 01/16/18 Arrival Time: 1229  ASSESSMENT & PLAN:  1. Fingernail problem    Reassured. Will watch and f/u if there is any change. I do not see any abnormalities.  Reviewed expectations re: course of current medical issues. Questions answered. Outlined signs and symptoms indicating need for more acute intervention. Patient verbalized understanding. After Visit Summary given.   SUBJECTIVE: History from: patient. Wayne Santos is a 67 y.o. male who reports his fingernails "look a little lighter." Has noticed over the past few days. No distal extremity pain. No extremity sensation changes or weakness. No injuries or recent illnesses. No new medications. Does not recall h/o similar. Afebrile. No recent travel. Not worsening. Skin without erythema.  Social History   Tobacco Use  Smoking Status Former Smoker  . Packs/day: 0.50  . Years: 47.00  . Pack years: 23.50  . Types: Cigarettes  Smokeless Tobacco Never Used    ROS: As per HPI.   OBJECTIVE:  Vitals:   01/16/18 1348  BP: 128/61  Pulse: 71  Resp: 18  Temp: 98.2 F (36.8 C)  TempSrc: Oral  SpO2: 100%    General appearance: alert; no distress Eyes: conjunctiva normal Lungs: clear to auscultation bilaterally Heart: regular rate and rhythm Extremities: no cyanosis or edema; symmetrical with no gross deformities; fingernails appear normal to me; brisk capillary refill; no clubbing Skin: warm and dry Neurologic: normal gait; normal symmetric reflexes Psychological: alert and cooperative; normal mood and affect  Labs: Results for orders placed or performed during the hospital encounter of 01/16/18  I-STAT, chem 8  Result Value Ref Range   Sodium 141 135 - 145 mmol/L   Potassium 3.6 3.5 - 5.1 mmol/L   Chloride 103 98 - 111 mmol/L   BUN 11 8 - 23 mg/dL   Creatinine, Ser 4.090.80 0.61 - 1.24 mg/dL   Glucose, Bld 811112 (H) 70 - 99 mg/dL   Calcium, Ion 9.141.25 7.821.15 - 1.40 mmol/L   TCO2 28  22 - 32 mmol/L   Hemoglobin 12.9 (L) 13.0 - 17.0 g/dL   HCT 95.638.0 (L) 21.339.0 - 08.652.0 %   Labs Reviewed  POCT I-STAT, CHEM 8 - Abnormal; Notable for the following components:      Result Value   Glucose, Bld 112 (*)    Hemoglobin 12.9 (*)    HCT 38.0 (*)    All other components within normal limits    Allergies  Allergen Reactions  . Ace Inhibitors Cough    Past Medical History:  Diagnosis Date  . Anemia   . AVN (avascular necrosis of bone) ([facility])   . Benign paroxysmal positional vertigo 06/15/2015  . CAD (coronary artery disease), native coronary artery    a. 08/24/16 NSTEMI/PCI: LM nl, LAD 40p, D1 60ost, D2 80ost-small, LCX 7970m (2.5x30 ONyx DES), OM1/2 min irregs, OM3 nl, RCA 30p/m, RPDA/RPAV/RPL1/RPL2/RPL3 nl, EF 55-65%.  . Childhood asthma   . GERD (gastroesophageal reflux disease)   . Glaucoma   . History of pneumonia    "4 times in my life" (08/24/2016)  . Hyperlipidemia   . Hypertension   . Memory impairment 06/15/2015  . Osteoporosis   . Primary localized osteoarthrosis of left hip 07/05/2016  . Seasonal allergies   . Tobacco abuse    Social History   Socioeconomic History  . Marital status: Single    Spouse name: Not on file  . Number of children: Not on file  . Years of education: Not on file  .  Highest education level: Not on file  Occupational History  . Not on file  Social Needs  . Financial resource strain: Not on file  . Food insecurity:    Worry: Not on file    Inability: Not on file  . Transportation needs:    Medical: Not on file    Non-medical: Not on file  Tobacco Use  . Smoking status: Former Smoker    Packs/day: 0.50    Years: 47.00    Pack years: 23.50    Types: Cigarettes  . Smokeless tobacco: Never Used  Substance and Sexual Activity  . Alcohol use: Yes    Alcohol/week: 0.0 standard drinks    Comment: 08/24/2016 "I'll have a couple drinks maybe 4 times/year"  . Drug use: Yes    Types: Marijuana    Comment: 08/24/2016 "nothing since my  younger days"  . Sexual activity: Yes    Comment: declined condoms  Lifestyle  . Physical activity:    Days per week: Not on file    Minutes per session: Not on file  . Stress: Not on file  Relationships  . Social connections:    Talks on phone: Not on file    Gets together: Not on file    Attends religious service: Not on file    Active member of club or organization: Not on file    Attends meetings of clubs or organizations: Not on file    Relationship status: Not on file  . Intimate partner violence:    Fear of current or ex partner: Not on file    Emotionally abused: Not on file    Physically abused: Not on file    Forced sexual activity: Not on file  Other Topics Concern  . Not on file  Social History Narrative  . Not on file   History reviewed. No pertinent family history. Past Surgical History:  Procedure Laterality Date  . CARDIAC CATHETERIZATION    . CORONARY ANGIOPLASTY WITH STENT PLACEMENT  08/24/2016  . CORONARY STENT INTERVENTION N/A 08/24/2016   Procedure: Coronary Stent Intervention;  Surgeon: Iran Ouch, MD;  [facility];  Service: Cardiovascular;  Laterality: N/A;  . HAND NERVE REPAIR Right 1990s?   "caught it in a machine; had to be reconstructed"  . JOINT REPLACEMENT    . LEFT HEART CATH AND CORONARY ANGIOGRAPHY N/A 08/24/2016   Procedure: Left Heart Cath and Coronary Angiography;  Surgeon: Iran Ouch, MD;  [facility];  Service: Cardiovascular;  Laterality: N/A;  . TOTAL HIP ARTHROPLASTY Right   . TOTAL HIP ARTHROPLASTY Left 07/05/2016   Procedure: TOTAL HIP ARTHROPLASTY ANTERIOR APPROACH;  Surgeon: Marcene Corning, MD;  Location: [facility];  Service: Orthopedics;  Laterality: Left;     Mardella Layman, MD 01/25/18 [phone]

## 2018-05-24 ENCOUNTER — Other Ambulatory Visit: Payer: Medicare Other

## 2018-05-29 ENCOUNTER — Other Ambulatory Visit: Payer: Medicare Other

## 2018-05-29 DIAGNOSIS — B20 Human immunodeficiency virus [HIV] disease: Secondary | ICD-10-CM

## 2018-05-31 LAB — COMPREHENSIVE METABOLIC PANEL
AG Ratio: 1.4 (calc) (ref 1.0–2.5)
ALKALINE PHOSPHATASE (APISO): 94 U/L (ref 40–115)
ALT: 10 U/L (ref 9–46)
AST: 14 U/L (ref 10–35)
Albumin: 4.4 g/dL (ref 3.6–5.1)
BUN: 12 mg/dL (ref 7–25)
CALCIUM: 10.1 mg/dL (ref 8.6–10.3)
CO2: 28 mmol/L (ref 20–32)
Chloride: 104 mmol/L (ref 98–110)
Creat: 1.18 mg/dL (ref 0.70–1.25)
Globulin: 3.2 g/dL (calc) (ref 1.9–3.7)
Glucose, Bld: 125 mg/dL — ABNORMAL HIGH (ref 65–99)
POTASSIUM: 3.7 mmol/L (ref 3.5–5.3)
Sodium: 141 mmol/L (ref 135–146)
Total Bilirubin: 0.3 mg/dL (ref 0.2–1.2)
Total Protein: 7.6 g/dL (ref 6.1–8.1)

## 2018-05-31 LAB — CBC
HCT: 37.6 % — ABNORMAL LOW (ref 38.5–50.0)
HEMOGLOBIN: 12.9 g/dL — AB (ref 13.2–17.1)
MCH: 29.7 pg (ref 27.0–33.0)
MCHC: 34.3 g/dL (ref 32.0–36.0)
MCV: 86.4 fL (ref 80.0–100.0)
MPV: 10.6 fL (ref 7.5–12.5)
Platelets: 300 10*3/uL (ref 140–400)
RBC: 4.35 10*6/uL (ref 4.20–5.80)
RDW: 12.7 % (ref 11.0–15.0)
WBC: 7 10*3/uL (ref 3.8–10.8)

## 2018-05-31 LAB — T-HELPER CELL (CD4) - (RCID CLINIC ONLY)
CD4 % Helper T Cell: 32 % — ABNORMAL LOW (ref 33–55)
CD4 T Cell Abs: 790 /uL (ref 400–2700)

## 2018-05-31 LAB — LIPID PANEL
CHOL/HDL RATIO: 3.4 (calc) (ref <5.0)
Cholesterol: 132 mg/dL (ref <200)
HDL: 39 mg/dL — AB (ref >40)
LDL CHOLESTEROL (CALC): 73 mg/dL
NON-HDL CHOLESTEROL (CALC): 93 mg/dL (ref <130)
TRIGLYCERIDES: 123 mg/dL (ref <150)

## 2018-05-31 LAB — HIV-1 RNA QUANT-NO REFLEX-BLD
HIV 1 RNA Quant: <20 copies/mL
HIV-1 RNA QUANT, LOG: NOT DETECTED {Log_copies}/mL

## 2018-05-31 LAB — RPR: RPR Ser Ql: NONREACTIVE

## 2018-06-06 ENCOUNTER — Other Ambulatory Visit: Payer: Self-pay | Admitting: Infectious Disease

## 2018-06-06 DIAGNOSIS — B20 Human immunodeficiency virus [HIV] disease: Secondary | ICD-10-CM

## 2018-06-06 DIAGNOSIS — I10 Essential (primary) hypertension: Secondary | ICD-10-CM

## 2018-06-07 ENCOUNTER — Ambulatory Visit: Payer: Medicare Other | Admitting: Infectious Disease

## 2018-06-07 VITALS — BP 121/75 | HR 78 | Temp 97.5°F | Wt 175.0 lb

## 2018-06-07 DIAGNOSIS — B20 Human immunodeficiency virus [HIV] disease: Secondary | ICD-10-CM | POA: Diagnosis not present

## 2018-06-07 DIAGNOSIS — M8700 Idiopathic aseptic necrosis of unspecified bone: Secondary | ICD-10-CM

## 2018-06-07 DIAGNOSIS — E119 Type 2 diabetes mellitus without complications: Secondary | ICD-10-CM | POA: Diagnosis not present

## 2018-06-07 DIAGNOSIS — E785 Hyperlipidemia, unspecified: Secondary | ICD-10-CM

## 2018-06-07 DIAGNOSIS — I10 Essential (primary) hypertension: Secondary | ICD-10-CM | POA: Diagnosis not present

## 2018-06-07 DIAGNOSIS — Z23 Encounter for immunization: Secondary | ICD-10-CM

## 2018-06-07 DIAGNOSIS — Z96643 Presence of artificial hip joint, bilateral: Secondary | ICD-10-CM

## 2018-06-07 DIAGNOSIS — Z8739 Personal history of other diseases of the musculoskeletal system and connective tissue: Secondary | ICD-10-CM

## 2018-06-07 DIAGNOSIS — Z7982 Long term (current) use of aspirin: Secondary | ICD-10-CM

## 2018-06-07 DIAGNOSIS — I2510 Atherosclerotic heart disease of native coronary artery without angina pectoris: Secondary | ICD-10-CM | POA: Diagnosis not present

## 2018-06-07 DIAGNOSIS — Z7902 Long term (current) use of antithrombotics/antiplatelets: Secondary | ICD-10-CM

## 2018-06-07 DIAGNOSIS — Z79899 Other long term (current) drug therapy: Secondary | ICD-10-CM

## 2018-06-07 DIAGNOSIS — E1065 Type 1 diabetes mellitus with hyperglycemia: Secondary | ICD-10-CM

## 2018-06-07 NOTE — Progress Notes (Signed)
Chief complaint: followup for HIV on medications  Subjective:    Patient ID: Wayne Santos, male    DOB: January 16, 1951, 68 y.o.   MRN: [id number]  HPI  Wayne Santos is a 68 y.o. male who is doing superbly well on his  antiviral regimen, SYMTUZA   Wayne Santos is doing well and has no complaints today.  He is potentially going to become sexually active with another partner asks about preexposure prophylaxis for the partner.  I advised him that in terms of his sexual relations with that partner if he remains undetectable the partner would not need prep for the purposes of their sexual intercourse but he might need it for purposes of sexual intercourse with others who might not know other HIV diagnosis or who might not be taking the medications as Wayne Santos is.     Past Medical History:  Diagnosis Date  . Anemia   . AVN (avascular necrosis of bone) ([place])   . Benign paroxysmal positional vertigo 06/15/2015  . CAD (coronary artery disease), native coronary artery    a. 08/24/16 NSTEMI/PCI: LM nl, LAD 40p, D1 60ost, D2 80ost-small, LCX 68m(2.5x30 ONyx DES), OM1/2 min irregs, OM3 nl, RCA 30p/m, RPDA/RPAV/RPL1/RPL2/RPL3 nl, EF 55-65%.  . Childhood asthma   . GERD (gastroesophageal reflux disease)   . Glaucoma   . History of pneumonia    "4 times in my life" (08/24/2016)  . Hyperlipidemia   . Hypertension   . Memory impairment 06/15/2015  . Osteoporosis   . Primary localized osteoarthrosis of left hip 07/05/2016  . Seasonal allergies   . Tobacco abuse     Past Surgical History:  Procedure Laterality Date  . CARDIAC CATHETERIZATION    . CORONARY ANGIOPLASTY WITH STENT PLACEMENT  08/24/2016  . CORONARY STENT INTERVENTION N/A 08/24/2016   Procedure: Coronary Stent Intervention;  Surgeon: MWellington Hampshire MD;  [facility];  Service: Cardiovascular;  Laterality: N/A;  . HAND NERVE REPAIR Right 1990s?   "caught it in a machine; had to be reconstructed"  . JOINT REPLACEMENT    .  LEFT HEART CATH AND CORONARY ANGIOGRAPHY N/A 08/24/2016   Procedure: Left Heart Cath and Coronary Angiography;  Surgeon: MWellington Hampshire MD;  [facility];  Service: Cardiovascular;  Laterality: N/A;  . TOTAL HIP ARTHROPLASTY Right   . TOTAL HIP ARTHROPLASTY Left 07/05/2016   Procedure: TOTAL HIP ARTHROPLASTY ANTERIOR APPROACH;  Surgeon: PMelrose Nakayama MD;  [facility]  Service: Orthopedics;  Laterality: Left;    No family history on file.    Social History   Socioeconomic History  . Marital status: Single    Spouse name: Not on file  . Number of children: Not on file  . Years of education: Not on file  . Highest education level: Not on file  Occupational History  . Not on file  Social Needs  . Financial resource strain: Not on file  . Food insecurity:    Worry: Not on file    Inability: Not on file  . Transportation needs:    Medical: Not on file    Non-medical: Not on file  Tobacco Use  . Smoking status: Former Smoker    Packs/day: 0.50    Years: 47.00    Pack years: 23.50    Types: Cigarettes  . Smokeless tobacco: Never Used  Substance and Sexual Activity  . Alcohol use: Yes    Alcohol/week: 0.0 standard drinks    Comment: 08/24/2016 "I'll  have a couple drinks maybe 4 times/year"  . Drug use: Yes    Types: Marijuana    Comment: 08/24/2016 "nothing since my younger days"  . Sexual activity: Yes    Comment: declined condoms  Lifestyle  . Physical activity:    Days per week: Not on file    Minutes per session: Not on file  . Stress: Not on file  Relationships  . Social connections:    Talks on phone: Not on file    Gets together: Not on file    Attends religious service: Not on file    Active member of club or organization: Not on file    Attends meetings of clubs or organizations: Not on file    Relationship status: Not on file  Other Topics Concern  . Not on file  Social History Narrative  . Not on file    Allergies  Allergen  Reactions  . Ace Inhibitors Cough     Current Outpatient Medications:  .  amLODipine (NORVASC) 5 MG tablet, TAKE 1 TABLET BY MOUTH EVERY DAY, Disp: 30 tablet, Rfl: 4 .  aspirin EC 81 MG EC tablet, Take 1 tablet (81 mg total) by mouth daily., Disp: , Rfl:  .  blood glucose meter kit and supplies KIT, Dispense based on patient and insurance preference. Use up to four times daily as directed. (FOR ICD-9 250.00, 250.01)., Disp: 1 each, Rfl: 0 .  Insulin Glargine (LANTUS) 100 UNIT/ML Solostar Pen, Inject 10 Units into the skin daily at 10 pm. (Patient taking differently: Inject 20 Units into the skin at bedtime. ), Disp: 15 mL, Rfl: 0 .  metFORMIN (GLUCOPHAGE) 500 MG tablet, Take 1 tablet (500 mg total) by mouth 2 (two) times daily with a meal. (Patient taking differently: Take 1,000 mg by mouth 2 (two) times daily with a meal. ), Disp: 60 tablet, Rfl: 0 .  Multiple Vitamin (MULTIVITAMIN) tablet, Take 1 tablet by mouth daily., Disp: , Rfl:  .  nitroGLYCERIN (NITROSTAT) 0.4 MG SL tablet, Place 1 tablet (0.4 mg total) under the tongue every 5 (five) minutes x 3 doses as needed for chest pain., Disp: 25 tablet, Rfl: 3 .  oxyCODONE-acetaminophen (PERCOCET/ROXICET) 5-325 MG tablet, Take 1 tablet by mouth daily as needed., Disp: , Rfl:  .  rosuvastatin (CRESTOR) 20 MG tablet, TAKE 1 TABLET(20 MG) BY MOUTH DAILY, Disp: 30 tablet, Rfl: 4 .  SYMTUZA 800-150-200-10 MG TABS, TAKE 1 TABLET BY MOUTH DAILY WITH BREAKFAST, Disp: 30 tablet, Rfl: 4 .  Vitamin D, Ergocalciferol, (DRISDOL) 50000 UNITS CAPS capsule, Take 1 capsule (50,000 Units total) by mouth every 7 (seven) days., Disp: 8 capsule, Rfl: 0 .  ranitidine (ZANTAC) 150 MG tablet, Take 1 tablet (150 mg total) by mouth 2 (two) times daily., Disp: 60 tablet, Rfl: 0     Review of Systems  Constitutional: Negative for activity change, appetite change, chills, diaphoresis, fatigue, fever and unexpected weight change.  HENT: Negative for congestion,  rhinorrhea, sinus pressure, sneezing, sore throat and trouble swallowing.   Eyes: Negative for photophobia and visual disturbance.  Respiratory: Negative for cough, chest tightness, shortness of breath, wheezing and stridor.   Cardiovascular: Negative for chest pain, palpitations and leg swelling.  Gastrointestinal: Negative for abdominal distention, abdominal pain, anal bleeding, blood in stool, constipation, diarrhea, nausea and vomiting.  Genitourinary: Negative for difficulty urinating, dysuria, flank pain and hematuria.  Musculoskeletal: Negative for arthralgias, back pain, gait problem, joint swelling and myalgias.  Skin: Negative for color change,  pallor, rash and wound.  Neurological: Negative for dizziness, tremors, weakness and light-headedness.  Hematological: Negative for adenopathy. Does not bruise/bleed easily.  Psychiatric/Behavioral: Negative for agitation, behavioral problems, confusion, decreased concentration, dysphoric mood and sleep disturbance.       Objective:   Physical Exam  Constitutional: He is oriented to person, place, and time. He appears well-developed and well-nourished.  HENT:  Head: Normocephalic and atraumatic.  Eyes: Conjunctivae and EOM are normal.  Neck: Normal range of motion. Neck supple.  Cardiovascular: Normal rate and regular rhythm.  Pulmonary/Chest: Effort normal. No respiratory distress. He has no wheezes.  Abdominal: Soft. He exhibits no distension.  Musculoskeletal: Normal range of motion.        General: No tenderness or edema.  Neurological: He is alert and oriented to person, place, and time.  Skin: Skin is warm and dry. No rash noted. No erythema. No pallor.  Psychiatric: He has a normal mood and affect. His behavior is normal. Judgment and thought content normal.  Nursing note and vitals reviewed.         Assessment & Plan:  HIV disease: Continue SYMTUZA.    Diabetes mellitus being followed by primary sely.  CAD: needs to  continue plavix, asa, beta blocker and I will intensity his statin therapy with crestor 20 mg  HTN: continue norvasc  And beta blocker, gauge her primary care.  AVN: sp bilateral THA  Hyperlipidemia: onj crestor  I spent greater than 25 minutes with the patient including greater than 50% of time in face to face counsel of the patient reviewing his labs reviewing the  U=U data i.e. undetectable in HIV positive partner equals on transmissible sexually to HIV negative sexual partner, regarding nature of preexposure prophylaxis and in coordination of his care.

## 2018-07-04 ENCOUNTER — Encounter: Payer: Self-pay | Admitting: Infectious Disease

## 2018-08-22 ENCOUNTER — Telehealth: Payer: Self-pay | Admitting: Cardiovascular Disease

## 2018-08-22 NOTE — Telephone Encounter (Signed)
VM to scheduled 12 month followup.

## 2018-09-12 ENCOUNTER — Telehealth: Payer: Self-pay | Admitting: Cardiovascular Disease

## 2018-09-12 NOTE — Telephone Encounter (Signed)
My Chart message sent

## 2018-10-22 ENCOUNTER — Other Ambulatory Visit: Payer: Self-pay | Admitting: Infectious Disease

## 2018-10-22 DIAGNOSIS — I10 Essential (primary) hypertension: Secondary | ICD-10-CM

## 2018-10-22 DIAGNOSIS — B20 Human immunodeficiency virus [HIV] disease: Secondary | ICD-10-CM

## 2018-11-21 ENCOUNTER — Other Ambulatory Visit: Payer: Medicare Other

## 2018-12-05 ENCOUNTER — Other Ambulatory Visit: Payer: Self-pay

## 2018-12-05 ENCOUNTER — Other Ambulatory Visit: Payer: Medicare Other

## 2018-12-05 DIAGNOSIS — B20 Human immunodeficiency virus [HIV] disease: Secondary | ICD-10-CM

## 2018-12-06 LAB — T-HELPER CELL (CD4) - (RCID CLINIC ONLY)
CD4 % Helper T Cell: 36 % (ref 33–65)
CD4 T Cell Abs: 825 /uL (ref 400–1790)

## 2018-12-13 LAB — CBC WITH DIFFERENTIAL/PLATELET
Absolute Monocytes: 329 cells/uL (ref 200–950)
Basophils Absolute: 62 cells/uL (ref 0–200)
Basophils Relative: 1 %
Eosinophils Absolute: 322 cells/uL (ref 15–500)
Eosinophils Relative: 5.2 %
HCT: 36 % — ABNORMAL LOW (ref 38.5–50.0)
Hemoglobin: 12.1 g/dL — ABNORMAL LOW (ref 13.2–17.1)
Lymphs Abs: 2182 cells/uL (ref 850–3900)
MCH: 29 pg (ref 27.0–33.0)
MCHC: 33.6 g/dL (ref 32.0–36.0)
MCV: 86.3 fL (ref 80.0–100.0)
MPV: 10.8 fL (ref 7.5–12.5)
Monocytes Relative: 5.3 %
Neutro Abs: 3305 cells/uL (ref 1500–7800)
Neutrophils Relative %: 53.3 %
Platelets: 284 10*3/uL (ref 140–400)
RBC: 4.17 10*6/uL — ABNORMAL LOW (ref 4.20–5.80)
RDW: 13.2 % (ref 11.0–15.0)
Total Lymphocyte: 35.2 %
WBC: 6.2 10*3/uL (ref 3.8–10.8)

## 2018-12-13 LAB — COMPLETE METABOLIC PANEL WITH GFR
AG Ratio: 1.5 (calc) (ref 1.0–2.5)
ALT: 11 U/L (ref 9–46)
AST: 14 U/L (ref 10–35)
Albumin: 4.5 g/dL (ref 3.6–5.1)
Alkaline phosphatase (APISO): 77 U/L (ref 35–144)
BUN: 14 mg/dL (ref 7–25)
CO2: 25 mmol/L (ref 20–32)
Calcium: 10.4 mg/dL — ABNORMAL HIGH (ref 8.6–10.3)
Chloride: 103 mmol/L (ref 98–110)
Creat: 1.03 mg/dL (ref 0.70–1.25)
GFR, Est African American: 87 mL/min/{1.73_m2} (ref >=60)
GFR, Est Non African American: 75 mL/min/{1.73_m2} (ref >=60)
Globulin: 3 g/dL (calc) (ref 1.9–3.7)
Glucose, Bld: 163 mg/dL — ABNORMAL HIGH (ref 65–99)
Potassium: 3.8 mmol/L (ref 3.5–5.3)
Sodium: 140 mmol/L (ref 135–146)
Total Bilirubin: 0.3 mg/dL (ref 0.2–1.2)
Total Protein: 7.5 g/dL (ref 6.1–8.1)

## 2018-12-13 LAB — RPR: RPR Ser Ql: NONREACTIVE

## 2018-12-13 LAB — HIV-1 RNA QUANT-NO REFLEX-BLD
HIV 1 RNA Quant: <20 copies/mL
HIV-1 RNA Quant, Log: <1.3 Log copies/mL

## 2018-12-25 ENCOUNTER — Telehealth: Payer: Self-pay

## 2018-12-25 NOTE — Telephone Encounter (Signed)
COVID-19 Pre-Screening Questions:12/25/18   Do you currently have a fever (>100 F), chills or unexplained body aches?NO  Are you currently experiencing new cough, shortness of breath, sore throat, runny nose?NO .  Have you recently travelled outside the state of [place] in the last 14 days? NO .  Have you been in contact with someone that is currently pending confirmation of Covid19 testing or has been confirmed to have the Covid19 virus?  NO  **If the patient answers NO to ALL questions -  advise the patient to please call the clinic before coming to the office should any symptoms develop.     

## 2018-12-26 ENCOUNTER — Other Ambulatory Visit: Payer: Self-pay

## 2018-12-26 ENCOUNTER — Ambulatory Visit: Payer: Medicare Other | Admitting: Infectious Disease

## 2018-12-26 ENCOUNTER — Encounter: Payer: Self-pay | Admitting: Infectious Disease

## 2018-12-26 VITALS — BP 117/71 | HR 88 | Temp 97.9°F

## 2018-12-26 DIAGNOSIS — E1065 Type 1 diabetes mellitus with hyperglycemia: Secondary | ICD-10-CM | POA: Diagnosis not present

## 2018-12-26 DIAGNOSIS — B20 Human immunodeficiency virus [HIV] disease: Secondary | ICD-10-CM

## 2018-12-26 DIAGNOSIS — L237 Allergic contact dermatitis due to plants, except food: Secondary | ICD-10-CM | POA: Diagnosis not present

## 2018-12-26 DIAGNOSIS — I10 Essential (primary) hypertension: Secondary | ICD-10-CM

## 2018-12-26 DIAGNOSIS — Z79899 Other long term (current) drug therapy: Secondary | ICD-10-CM

## 2018-12-26 DIAGNOSIS — I2510 Atherosclerotic heart disease of native coronary artery without angina pectoris: Secondary | ICD-10-CM

## 2018-12-26 DIAGNOSIS — M8700 Idiopathic aseptic necrosis of unspecified bone: Secondary | ICD-10-CM

## 2018-12-26 HISTORY — DX: Allergic contact dermatitis due to plants, except food: L23.7

## 2018-12-26 MED ORDER — TRIAMCINOLONE ACETONIDE 0.5 % EX OINT: 1.0000 "application " | TOPICAL_OINTMENT | Freq: Two times a day (BID) | CUTANEOUS | 1 refills | Status: DC

## 2018-12-26 NOTE — Progress Notes (Signed)
Chief complaint: followup for HIV on medications  Subjective:    Patient ID: Wayne Santos, male    DOB: 1951-03-24, 68 y.o.   MRN: [id number]  HPI  Wayne Santos is a 68 y.o. male who is doing superbly well on his  antiviral regimen, SYMTUZA   Complains of a pruritic rash that he developed in the last few days which he attributes to poison ivy.  He has been trying topical agents for this.     Past Medical History:  Diagnosis Date  . Anemia   . AVN (avascular necrosis of bone) ([place])   . Benign paroxysmal positional vertigo 06/15/2015  . CAD (coronary artery disease), native coronary artery    a. 08/24/16 NSTEMI/PCI: LM nl, LAD 40p, D1 60ost, D2 80ost-small, LCX 16m(2.5x30 ONyx DES), OM1/2 min irregs, OM3 nl, RCA 30p/m, RPDA/RPAV/RPL1/RPL2/RPL3 nl, EF 55-65%.  . Childhood asthma   . GERD (gastroesophageal reflux disease)   . Glaucoma   . History of pneumonia    "4 times in my life" (08/24/2016)  . Hyperlipidemia   . Hypertension   . Memory impairment 06/15/2015  . Osteoporosis   . Primary localized osteoarthrosis of left hip 07/05/2016  . Seasonal allergies   . Tobacco abuse     Past Surgical History:  Procedure Laterality Date  . CARDIAC CATHETERIZATION    . CORONARY ANGIOPLASTY WITH STENT PLACEMENT  08/24/2016  . CORONARY STENT INTERVENTION N/A 08/24/2016   Procedure: Coronary Stent Intervention;  Surgeon: MWellington Hampshire MD;  [facility];  Service: Cardiovascular;  Laterality: N/A;  . HAND NERVE REPAIR Right 1990s?   "caught it in a machine; had to be reconstructed"  . JOINT REPLACEMENT    . LEFT HEART CATH AND CORONARY ANGIOGRAPHY N/A 08/24/2016   Procedure: Left Heart Cath and Coronary Angiography;  Surgeon: MWellington Hampshire MD;  [facility];  Service: Cardiovascular;  Laterality: N/A;  . TOTAL HIP ARTHROPLASTY Right   . TOTAL HIP ARTHROPLASTY Left 07/05/2016   Procedure: TOTAL HIP ARTHROPLASTY ANTERIOR APPROACH;  Surgeon:  PMelrose Nakayama MD;  Location: [facility]  Service: Orthopedics;  Laterality: Left;    No family history on file.    Social History   Socioeconomic History  . Marital status: Single    Spouse name: Not on file  . Number of children: Not on file  . Years of education: Not on file  . Highest education level: Not on file  Occupational History  . Not on file  Social Needs  . Financial resource strain: Not on file  . Food insecurity    Worry: Not on file    Inability: Not on file  . Transportation needs    Medical: Not on file    Non-medical: Not on file  Tobacco Use  . Smoking status: Former Smoker    Packs/day: 0.50    Years: 47.00    Pack years: 23.50    Types: Cigarettes  . Smokeless tobacco: Never Used  Substance and Sexual Activity  . Alcohol use: Yes    Alcohol/week: 0.0 standard drinks    Comment: 08/24/2016 "I'll have a couple drinks maybe 4 times/year"  . Drug use: Yes    Types: Marijuana    Comment: 08/24/2016 "nothing since my younger days"  . Sexual activity: Yes    Comment: declined condoms  Lifestyle  . Physical activity    Days per week: Not on file    Minutes per session: Not on  file  . Stress: Not on file  Relationships  . Social Herbalist on phone: Not on file    Gets together: Not on file    Attends religious service: Not on file    Active member of club or organization: Not on file    Attends meetings of clubs or organizations: Not on file    Relationship status: Not on file  Other Topics Concern  . Not on file  Social History Narrative  . Not on file    Allergies  Allergen Reactions  . Ace Inhibitors Cough     Current Outpatient Medications:  .  amLODipine (NORVASC) 5 MG tablet, TAKE 1 TABLET BY MOUTH EVERY DAY, Disp: 30 tablet, Rfl: 4 .  aspirin EC 81 MG EC tablet, Take 1 tablet (81 mg total) by mouth daily., Disp: , Rfl:  .  blood glucose meter kit and supplies KIT, Dispense based on patient and insurance preference. Use up  to four times daily as directed. (FOR ICD-9 250.00, 250.01)., Disp: 1 each, Rfl: 0 .  Insulin Glargine (LANTUS) 100 UNIT/ML Solostar Pen, Inject 10 Units into the skin daily at 10 pm. (Patient taking differently: Inject 20 Units into the skin at bedtime. ), Disp: 15 mL, Rfl: 0 .  metFORMIN (GLUCOPHAGE) 500 MG tablet, Take 1 tablet (500 mg total) by mouth 2 (two) times daily with a meal. (Patient taking differently: Take 1,000 mg by mouth 2 (two) times daily with a meal. ), Disp: 60 tablet, Rfl: 0 .  Multiple Vitamin (MULTIVITAMIN) tablet, Take 1 tablet by mouth daily., Disp: , Rfl:  .  nitroGLYCERIN (NITROSTAT) 0.4 MG SL tablet, Place 1 tablet (0.4 mg total) under the tongue every 5 (five) minutes x 3 doses as needed for chest pain., Disp: 25 tablet, Rfl: 3 .  oxyCODONE-acetaminophen (PERCOCET/ROXICET) 5-325 MG tablet, Take 1 tablet by mouth daily as needed., Disp: , Rfl:  .  ranitidine (ZANTAC) 150 MG tablet, Take 1 tablet (150 mg total) by mouth 2 (two) times daily., Disp: 60 tablet, Rfl: 0 .  rosuvastatin (CRESTOR) 20 MG tablet, TAKE 1 TABLET BY MOUTH EVERY DAY, Disp: 30 tablet, Rfl: 4 .  SYMTUZA 800-150-200-10 MG TABS, TAKE 1 TABLET BY MOUTH EVERY DAY WITH BREAKFAST, Disp: 30 tablet, Rfl: 4 .  Vitamin D, Ergocalciferol, (DRISDOL) 50000 UNITS CAPS capsule, Take 1 capsule (50,000 Units total) by mouth every 7 (seven) days., Disp: 8 capsule, Rfl: 0     Review of Systems  Constitutional: Negative for activity change, appetite change, chills, diaphoresis, fatigue, fever and unexpected weight change.  HENT: Negative for congestion, rhinorrhea, sinus pressure, sneezing, sore throat and trouble swallowing.   Eyes: Negative for photophobia and visual disturbance.  Respiratory: Negative for cough, chest tightness, shortness of breath, wheezing and stridor.   Cardiovascular: Negative for chest pain, palpitations and leg swelling.  Gastrointestinal: Negative for abdominal distention, abdominal pain, anal  bleeding, blood in stool, constipation, diarrhea, nausea and vomiting.  Genitourinary: Negative for difficulty urinating, dysuria, flank pain and hematuria.  Musculoskeletal: Negative for arthralgias, back pain, gait problem, joint swelling and myalgias.  Skin: Positive for rash. Negative for color change, pallor and wound.  Neurological: Negative for dizziness, tremors, weakness and light-headedness.  Hematological: Negative for adenopathy. Does not bruise/bleed easily.  Psychiatric/Behavioral: Negative for agitation, behavioral problems, confusion, decreased concentration, dysphoric mood and sleep disturbance.       Objective:   Physical Exam  Constitutional: He is oriented to person, place, and time.  He appears well-developed and well-nourished.  HENT:  Head: Normocephalic and atraumatic.  Eyes: Conjunctivae and EOM are normal.  Neck: Normal range of motion. Neck supple.  Cardiovascular: Normal rate and regular rhythm.  Pulmonary/Chest: Effort normal. No respiratory distress. He has no wheezes.  Abdominal: Soft. He exhibits no distension.  Musculoskeletal: Normal range of motion.        General: No tenderness or edema.  Neurological: He is alert and oriented to person, place, and time.  Skin: Skin is warm and dry. Rash noted. No erythema. No pallor.  Psychiatric: He has a normal mood and affect. His behavior is normal. Judgment and thought content normal.  Nursing note and vitals reviewed.    Pruritic rash on extensors surfaces but especially     Assessment & Plan:  HIV disease: Continue SYMTUZA.    Rash: Certainly could be poison ivy we will give him a triamcinolone prescription.  He is to call if this does not improve.   Diabetes mellitus being followed by primary sely.  CAD: ontinue plavix, asa, beta blocker and I will intensity his statin therapy with crestor 20 mg  HTN: continue norvasc  And beta blocker  AVN: sp bilateral THA  Hyperlipidemia: onj crestor   Warmer smoker: Commended him on quitting again.

## 2019-03-27 ENCOUNTER — Other Ambulatory Visit: Payer: Self-pay | Admitting: Infectious Disease

## 2019-03-27 DIAGNOSIS — B20 Human immunodeficiency virus [HIV] disease: Secondary | ICD-10-CM

## 2019-03-27 DIAGNOSIS — I10 Essential (primary) hypertension: Secondary | ICD-10-CM

## 2019-06-10 ENCOUNTER — Other Ambulatory Visit: Payer: Medicare Other

## 2019-06-10 ENCOUNTER — Other Ambulatory Visit: Payer: Self-pay

## 2019-06-10 DIAGNOSIS — E1065 Type 1 diabetes mellitus with hyperglycemia: Secondary | ICD-10-CM

## 2019-06-10 DIAGNOSIS — L237 Allergic contact dermatitis due to plants, except food: Secondary | ICD-10-CM

## 2019-06-10 DIAGNOSIS — B20 Human immunodeficiency virus [HIV] disease: Secondary | ICD-10-CM

## 2019-06-11 LAB — T-HELPER CELL (CD4) - (RCID CLINIC ONLY)
CD4 % Helper T Cell: 29 % — ABNORMAL LOW (ref 33–65)
CD4 T Cell Abs: 725 /uL (ref 400–1790)

## 2019-06-16 LAB — LIPID PANEL
Cholesterol: 153 mg/dL (ref <200)
HDL: 47 mg/dL (ref >=40)
LDL Cholesterol (Calc): 88 mg/dL (calc)
Non-HDL Cholesterol (Calc): 106 mg/dL (calc) (ref <130)
Total CHOL/HDL Ratio: 3.3 (calc) (ref <5.0)
Triglycerides: 89 mg/dL (ref <150)

## 2019-06-16 LAB — COMPLETE METABOLIC PANEL WITH GFR
AG Ratio: 1.5 (calc) (ref 1.0–2.5)
ALT: 14 U/L (ref 9–46)
AST: 14 U/L (ref 10–35)
Albumin: 4.6 g/dL (ref 3.6–5.1)
Alkaline phosphatase (APISO): 91 U/L (ref 35–144)
BUN: 9 mg/dL (ref 7–25)
CO2: 27 mmol/L (ref 20–32)
Calcium: 9.9 mg/dL (ref 8.6–10.3)
Chloride: 105 mmol/L (ref 98–110)
Creat: 0.92 mg/dL (ref 0.70–1.25)
GFR, Est African American: 99 mL/min/{1.73_m2} (ref >=60)
GFR, Est Non African American: 85 mL/min/{1.73_m2} (ref >=60)
Globulin: 3.1 g/dL (calc) (ref 1.9–3.7)
Glucose, Bld: 129 mg/dL — ABNORMAL HIGH (ref 65–99)
Potassium: 3.9 mmol/L (ref 3.5–5.3)
Sodium: 142 mmol/L (ref 135–146)
Total Bilirubin: 0.3 mg/dL (ref 0.2–1.2)
Total Protein: 7.7 g/dL (ref 6.1–8.1)

## 2019-06-16 LAB — CBC WITH DIFFERENTIAL/PLATELET
Absolute Monocytes: 475 cells/uL (ref 200–950)
Basophils Absolute: 88 cells/uL (ref 0–200)
Basophils Relative: 1.2 %
Eosinophils Absolute: 898 cells/uL — ABNORMAL HIGH (ref 15–500)
Eosinophils Relative: 12.3 %
HCT: 34.8 % — ABNORMAL LOW (ref 38.5–50.0)
Hemoglobin: 11.2 g/dL — ABNORMAL LOW (ref 13.2–17.1)
Lymphs Abs: 2577 cells/uL (ref 850–3900)
MCH: 26.4 pg — ABNORMAL LOW (ref 27.0–33.0)
MCHC: 32.2 g/dL (ref 32.0–36.0)
MCV: 82.1 fL (ref 80.0–100.0)
MPV: 10.3 fL (ref 7.5–12.5)
Monocytes Relative: 6.5 %
Neutro Abs: 3263 cells/uL (ref 1500–7800)
Neutrophils Relative %: 44.7 %
Platelets: 318 10*3/uL (ref 140–400)
RBC: 4.24 10*6/uL (ref 4.20–5.80)
RDW: 14.1 % (ref 11.0–15.0)
Total Lymphocyte: 35.3 %
WBC: 7.3 10*3/uL (ref 3.8–10.8)

## 2019-06-16 LAB — HIV RNA, RTPCR W/R GT (RTI, PI,INT)
HIV 1 RNA Quant: <20 copies/mL
HIV-1 RNA Quant, Log: <1.3 Log copies/mL

## 2019-06-16 LAB — RPR: RPR Ser Ql: NONREACTIVE

## 2019-06-26 ENCOUNTER — Encounter: Payer: Medicare Other | Admitting: Infectious Disease

## 2019-07-08 ENCOUNTER — Other Ambulatory Visit: Payer: Self-pay

## 2019-07-08 ENCOUNTER — Encounter: Payer: Self-pay | Admitting: Infectious Disease

## 2019-07-08 ENCOUNTER — Ambulatory Visit (INDEPENDENT_AMBULATORY_CARE_PROVIDER_SITE_OTHER): Payer: Medicare Other | Admitting: Infectious Disease

## 2019-07-08 ENCOUNTER — Ambulatory Visit: Payer: Medicare Other

## 2019-07-08 VITALS — Ht 69.0 in | Wt 181.0 lb

## 2019-07-08 DIAGNOSIS — Z79899 Other long term (current) drug therapy: Secondary | ICD-10-CM

## 2019-07-08 DIAGNOSIS — Z113 Encounter for screening for infections with a predominantly sexual mode of transmission: Secondary | ICD-10-CM

## 2019-07-08 DIAGNOSIS — M8700 Idiopathic aseptic necrosis of unspecified bone: Secondary | ICD-10-CM

## 2019-07-08 DIAGNOSIS — I10 Essential (primary) hypertension: Secondary | ICD-10-CM

## 2019-07-08 DIAGNOSIS — I2510 Atherosclerotic heart disease of native coronary artery without angina pectoris: Secondary | ICD-10-CM

## 2019-07-08 DIAGNOSIS — B20 Human immunodeficiency virus [HIV] disease: Secondary | ICD-10-CM | POA: Diagnosis not present

## 2019-07-08 DIAGNOSIS — E1065 Type 1 diabetes mellitus with hyperglycemia: Secondary | ICD-10-CM

## 2019-07-08 NOTE — Progress Notes (Signed)
Chief complaint: followup for HIV on medications  Subjective:    Patient ID: Wayne Santos, male    DOB: Dec 03, 1950, 69 y.o.   MRN: [id number]  HPI  Wayne Santos is a 69 y.o. male who is doing superbly well on his  antiviral regimen, SYMTUZA  Wayne Santos was diagnosed in 1987 he tells me and had a CD4 nadir then and then once he went on an ARB holiday per Dr. Ruffin Frederick instructions.  He is otherwise maintain strict adherence to his antivirals and remained undetectable  Wayne Santos continues to do quite well on Symtuza with undetectable viral load.  He has renewed his SPAP program.  He is registered to get COVID-19 vaccination and should be getting that soon I hope.       Past Medical History:  Diagnosis Date  . Anemia   . AVN (avascular necrosis of bone) ([place])   . Benign paroxysmal positional vertigo 06/15/2015  . CAD (coronary artery disease), native coronary artery    a. 08/24/16 NSTEMI/PCI: LM nl, LAD 40p, D1 60ost, D2 80ost-small, LCX 99m(2.5x30 ONyx DES), OM1/2 min irregs, OM3 nl, RCA 30p/m, RPDA/RPAV/RPL1/RPL2/RPL3 nl, EF 55-65%.  . Childhood asthma   . GERD (gastroesophageal reflux disease)   . Glaucoma   . History of pneumonia    "4 times in my life" (08/24/2016)  . Hyperlipidemia   . Hypertension   . Memory impairment 06/15/2015  . Osteoporosis   . Poison ivy 12/26/2018  . Primary localized osteoarthrosis of left hip 07/05/2016  . Seasonal allergies   . Tobacco abuse     Past Surgical History:  Procedure Laterality Date  . CARDIAC CATHETERIZATION    . CORONARY ANGIOPLASTY WITH STENT PLACEMENT  08/24/2016  . CORONARY STENT INTERVENTION N/A 08/24/2016   Procedure: Coronary Stent Intervention;  Surgeon: MWellington Hampshire MD;  [facility];  Service: Cardiovascular;  Laterality: N/A;  . HAND NERVE REPAIR Right 1990s?   "caught it in a machine; had to be reconstructed"  . JOINT REPLACEMENT    . LEFT HEART CATH AND CORONARY ANGIOGRAPHY N/A 08/24/2016    Procedure: Left Heart Cath and Coronary Angiography;  Surgeon: MWellington Hampshire MD;  [facility];  Service: Cardiovascular;  Laterality: N/A;  . TOTAL HIP ARTHROPLASTY Right   . TOTAL HIP ARTHROPLASTY Left 07/05/2016   Procedure: TOTAL HIP ARTHROPLASTY ANTERIOR APPROACH;  Surgeon: PMelrose Nakayama MD;  [facility]  Service: Orthopedics;  Laterality: Left;    No family history on file.    Social History   Socioeconomic History  . Marital status: Single    Spouse name: Not on file  . Number of children: Not on file  . Years of education: Not on file  . Highest education level: Not on file  Occupational History  . Not on file  Tobacco Use  . Smoking status: Former Smoker    Packs/day: 0.50    Years: 47.00    Pack years: 23.50    Types: Cigarettes    Quit date: 09/06/2016    Years since quitting: 2.8  . Smokeless tobacco: Never Used  Substance and Sexual Activity  . Alcohol use: Yes    Alcohol/week: 0.0 standard drinks    Comment: 08/24/2016 "I'll have a couple drinks maybe 4 times/year"  . Drug use: Yes    Types: Marijuana    Comment: 08/24/2016 "nothing since my younger days"  . Sexual activity: Yes    Comment: declined condoms  Other Topics  Concern  . Not on file  Social History Narrative  . Not on file   Social Determinants of Health   Financial Resource Strain:   . Difficulty of Paying Living Expenses: Not on file  Food Insecurity:   . Worried About Charity fundraiser in the Last Year: Not on file  . Ran Out of Food in the Last Year: Not on file  Transportation Needs:   . Lack of Transportation (Medical): Not on file  . Lack of Transportation (Non-Medical): Not on file  Physical Activity:   . Days of Exercise per Week: Not on file  . Minutes of Exercise per Session: Not on file  Stress:   . Feeling of Stress : Not on file  Social Connections:   . Frequency of Communication with Friends and Family: Not on file  . Frequency of Social  Gatherings with Friends and Family: Not on file  . Attends Religious Services: Not on file  . Active Member of Clubs or Organizations: Not on file  . Attends Archivist Meetings: Not on file  . Marital Status: Not on file    Allergies  Allergen Reactions  . Ace Inhibitors Cough     Current Outpatient Medications:  .  amLODipine (NORVASC) 5 MG tablet, TAKE 1 TABLET BY MOUTH EVERY DAY, Disp: 30 tablet, Rfl: 4 .  aspirin EC 81 MG EC tablet, Take 1 tablet (81 mg total) by mouth daily., Disp: , Rfl:  .  blood glucose meter kit and supplies KIT, Dispense based on patient and insurance preference. Use up to four times daily as directed. (FOR ICD-9 250.00, 250.01)., Disp: 1 each, Rfl: 0 .  cetirizine (ZYRTEC) 10 MG tablet, Take 10 mg by mouth daily., Disp: , Rfl:  .  Insulin Glargine (LANTUS) 100 UNIT/ML Solostar Pen, Inject 10 Units into the skin daily at 10 pm. (Patient taking differently: Inject 20 Units into the skin at bedtime. ), Disp: 15 mL, Rfl: 0 .  metFORMIN (GLUCOPHAGE) 1000 MG tablet, Take 1,000 mg by mouth 2 (two) times daily., Disp: , Rfl:  .  nitroGLYCERIN (NITROSTAT) 0.4 MG SL tablet, Place 1 tablet (0.4 mg total) under the tongue every 5 (five) minutes x 3 doses as needed for chest pain., Disp: 25 tablet, Rfl: 3 .  rosuvastatin (CRESTOR) 20 MG tablet, TAKE 1 TABLET BY MOUTH EVERY DAY, Disp: 30 tablet, Rfl: 4 .  SYMTUZA 800-150-200-10 MG TABS, TAKE 1 TABLET BY MOUTH EVERY DAY WITH BREAKFAST, Disp: 30 tablet, Rfl: 4 .  triamcinolone ointment (KENALOG) 0.5 %, Apply 1 application topically 2 (two) times daily. (Patient not taking: Reported on 07/08/2019), Disp: 30 g, Rfl: 1     Review of Systems  Constitutional: Negative for activity change, appetite change, chills, diaphoresis, fatigue, fever and unexpected weight change.  HENT: Negative for congestion, rhinorrhea, sinus pressure, sneezing, sore throat and trouble swallowing.   Eyes: Negative for photophobia and visual  disturbance.  Respiratory: Negative for cough, chest tightness, shortness of breath, wheezing and stridor.   Cardiovascular: Negative for chest pain, palpitations and leg swelling.  Gastrointestinal: Negative for abdominal distention, abdominal pain, anal bleeding, blood in stool, constipation, diarrhea, nausea and vomiting.  Genitourinary: Negative for difficulty urinating, dysuria, flank pain and hematuria.  Musculoskeletal: Negative for arthralgias, back pain, gait problem, joint swelling and myalgias.  Skin: Positive for rash. Negative for color change, pallor and wound.  Neurological: Negative for dizziness, tremors, weakness and light-headedness.  Hematological: Negative for adenopathy. Does not bruise/bleed  easily.  Psychiatric/Behavioral: Negative for agitation, behavioral problems, confusion, decreased concentration, dysphoric mood and sleep disturbance.       Objective:   Physical Exam  Constitutional: He is oriented to person, place, and time. He appears well-developed and well-nourished.  HENT:  Head: Normocephalic and atraumatic.  Eyes: Conjunctivae and EOM are normal.  Cardiovascular: Normal rate and regular rhythm.  Pulmonary/Chest: Effort normal. No respiratory distress. He has no wheezes.  Abdominal: Soft. He exhibits no distension.  Musculoskeletal:        General: No tenderness or edema. Normal range of motion.     Cervical back: Normal range of motion and neck supple.  Neurological: He is alert and oriented to person, place, and time.  Skin: Skin is warm and dry. Rash noted. No erythema. No pallor.  Psychiatric: He has a normal mood and affect. His behavior is normal. Judgment and thought content normal.  Nursing note and vitals reviewed.        Assessment & Plan:  HIV disease: Continue SYMTUZA.    Diabetes mellitus being followed by primary sely.  CAD: ontinue plavix, asa, beta blocker and I will intensity his statin therapy with crestor 20 mg  HTN:  continue norvasc  And beta blocker  AVN: sp bilateral THA  Hyperlipidemia: onj crestor  Warmer smoker: Commended him on quitting again.

## 2019-08-16 ENCOUNTER — Other Ambulatory Visit: Payer: Self-pay | Admitting: Infectious Disease

## 2019-08-16 DIAGNOSIS — I10 Essential (primary) hypertension: Secondary | ICD-10-CM

## 2019-08-16 DIAGNOSIS — B20 Human immunodeficiency virus [HIV] disease: Secondary | ICD-10-CM

## 2019-08-19 ENCOUNTER — Other Ambulatory Visit: Payer: Self-pay | Admitting: Infectious Disease

## 2019-08-19 DIAGNOSIS — B20 Human immunodeficiency virus [HIV] disease: Secondary | ICD-10-CM

## 2019-08-19 DIAGNOSIS — I10 Essential (primary) hypertension: Secondary | ICD-10-CM

## 2019-12-02 ENCOUNTER — Other Ambulatory Visit: Payer: Self-pay

## 2019-12-02 ENCOUNTER — Other Ambulatory Visit: Payer: Medicare Other

## 2019-12-02 DIAGNOSIS — I2510 Atherosclerotic heart disease of native coronary artery without angina pectoris: Secondary | ICD-10-CM

## 2019-12-02 DIAGNOSIS — B20 Human immunodeficiency virus [HIV] disease: Secondary | ICD-10-CM

## 2019-12-03 LAB — T-HELPER CELL (CD4) - (RCID CLINIC ONLY)
CD4 % Helper T Cell: 36 % (ref 33–65)
CD4 T Cell Abs: 810 /uL (ref 400–1790)

## 2019-12-06 LAB — COMPLETE METABOLIC PANEL WITH GFR
AG Ratio: 1.7 (calc) (ref 1.0–2.5)
ALT: 12 U/L (ref 9–46)
AST: 14 U/L (ref 10–35)
Albumin: 4.5 g/dL (ref 3.6–5.1)
Alkaline phosphatase (APISO): 72 U/L (ref 35–144)
BUN: 12 mg/dL (ref 7–25)
CO2: 25 mmol/L (ref 20–32)
Calcium: 9.7 mg/dL (ref 8.6–10.3)
Chloride: 104 mmol/L (ref 98–110)
Creat: 1.13 mg/dL (ref 0.70–1.25)
GFR, Est African American: 77 mL/min/{1.73_m2} (ref >=60)
GFR, Est Non African American: 66 mL/min/{1.73_m2} (ref >=60)
Globulin: 2.7 g/dL (calc) (ref 1.9–3.7)
Glucose, Bld: 97 mg/dL (ref 65–99)
Potassium: 3.7 mmol/L (ref 3.5–5.3)
Sodium: 141 mmol/L (ref 135–146)
Total Bilirubin: 0.3 mg/dL (ref 0.2–1.2)
Total Protein: 7.2 g/dL (ref 6.1–8.1)

## 2019-12-06 LAB — CBC WITH DIFFERENTIAL/PLATELET
Absolute Monocytes: 455 cells/uL (ref 200–950)
Basophils Absolute: 62 cells/uL (ref 0–200)
Basophils Relative: 0.9 %
Eosinophils Absolute: 393 cells/uL (ref 15–500)
Eosinophils Relative: 5.7 %
HCT: 30.4 % — ABNORMAL LOW (ref 38.5–50.0)
Hemoglobin: 9.4 g/dL — ABNORMAL LOW (ref 13.2–17.1)
Lymphs Abs: 2180 cells/uL (ref 850–3900)
MCH: 23.5 pg — ABNORMAL LOW (ref 27.0–33.0)
MCHC: 30.9 g/dL — ABNORMAL LOW (ref 32.0–36.0)
MCV: 76 fL — ABNORMAL LOW (ref 80.0–100.0)
MPV: 10.5 fL (ref 7.5–12.5)
Monocytes Relative: 6.6 %
Neutro Abs: 3809 cells/uL (ref 1500–7800)
Neutrophils Relative %: 55.2 %
Platelets: 337 10*3/uL (ref 140–400)
RBC: 4 10*6/uL — ABNORMAL LOW (ref 4.20–5.80)
RDW: 16.2 % — ABNORMAL HIGH (ref 11.0–15.0)
Total Lymphocyte: 31.6 %
WBC: 6.9 10*3/uL (ref 3.8–10.8)

## 2019-12-06 LAB — RPR: RPR Ser Ql: NONREACTIVE

## 2019-12-06 LAB — HIV-1 RNA QUANT-NO REFLEX-BLD
HIV 1 RNA Quant: <20 copies/mL
HIV-1 RNA Quant, Log: <1.3 Log copies/mL

## 2019-12-16 ENCOUNTER — Encounter: Payer: Self-pay | Admitting: Infectious Disease

## 2019-12-16 ENCOUNTER — Other Ambulatory Visit: Payer: Self-pay

## 2019-12-16 ENCOUNTER — Ambulatory Visit: Payer: Medicare Other | Admitting: Infectious Disease

## 2019-12-16 VITALS — BP 122/68 | HR 79 | Temp 98.3°F | Wt 179.0 lb

## 2019-12-16 DIAGNOSIS — D649 Anemia, unspecified: Secondary | ICD-10-CM

## 2019-12-16 DIAGNOSIS — I10 Essential (primary) hypertension: Secondary | ICD-10-CM | POA: Diagnosis not present

## 2019-12-16 DIAGNOSIS — I2510 Atherosclerotic heart disease of native coronary artery without angina pectoris: Secondary | ICD-10-CM

## 2019-12-16 DIAGNOSIS — B20 Human immunodeficiency virus [HIV] disease: Secondary | ICD-10-CM

## 2019-12-16 HISTORY — DX: Anemia, unspecified: D64.9

## 2019-12-16 NOTE — Progress Notes (Signed)
Chief complaint: followup for HIV on medications  Subjective:    Patient ID: Wayne Santos, male    DOB: July 10, 1950, 69 y.o.   MRN: [id number]  HPI  Wayne Santos is a 69 y.o. male who is doing superbly well on his  antiviral regimen, SYMTUZA  Wayne Santos was diagnosed in 1987 he tells me and had a CD4 nadir then and then once he went on an ARB holiday per Dr. Ruffin Frederick instructions.  He is otherwise maintain strict adherence to his antivirals and remained undetectable  Wayne Santos continues to do quite well on Symtuza with undetectable viral load.  He has renewed his SPAP program.  He has gotten COVID-19 vaccinated he is renewed his SPAP program      Past Medical History:  Diagnosis Date  . Anemia   . AVN (avascular necrosis of bone) ([place])   . Benign paroxysmal positional vertigo 06/15/2015  . CAD (coronary artery disease), native coronary artery    a. 08/24/16 NSTEMI/PCI: LM nl, Santos 40p, D1 60ost, D2 80ost-small, LCX 66m(2.5x30 ONyx DES), OM1/2 min irregs, OM3 nl, RCA 30p/m, RPDA/RPAV/RPL1/RPL2/RPL3 nl, EF 55-65%.  . Childhood asthma   . GERD (gastroesophageal reflux disease)   . Glaucoma   . History of pneumonia    "4 times in my life" (08/24/2016)  . Hyperlipidemia   . Hypertension   . Memory impairment 06/15/2015  . Osteoporosis   . Poison ivy 12/26/2018  . Primary localized osteoarthrosis of left hip 07/05/2016  . Seasonal allergies   . Tobacco abuse     Past Surgical History:  Procedure Laterality Date  . CARDIAC CATHETERIZATION    . CORONARY ANGIOPLASTY WITH STENT PLACEMENT  08/24/2016  . CORONARY STENT INTERVENTION N/A 08/24/2016   Procedure: Coronary Stent Intervention;  Surgeon: MWellington Hampshire MD;  [facility];  Service: Cardiovascular;  Laterality: N/A;  . HAND NERVE REPAIR Right 1990s?   "caught it in a machine; had to be reconstructed"  . JOINT REPLACEMENT    . LEFT HEART CATH AND CORONARY ANGIOGRAPHY N/A 08/24/2016   Procedure: Left Heart Cath  and Coronary Angiography;  Surgeon: MWellington Hampshire MD;  [facility];  Service: Cardiovascular;  Laterality: N/A;  . TOTAL HIP ARTHROPLASTY Right   . TOTAL HIP ARTHROPLASTY Left 07/05/2016   Procedure: TOTAL HIP ARTHROPLASTY ANTERIOR APPROACH;  Surgeon: PMelrose Nakayama MD;  [facility]  Service: Orthopedics;  Laterality: Left;    No family history on file.    Social History   Socioeconomic History  . Marital status: Single    Spouse name: Not on file  . Number of children: Not on file  . Years of education: Not on file  . Highest education level: Not on file  Occupational History  . Not on file  Tobacco Use  . Smoking status: Former Smoker    Packs/day: 0.50    Years: 47.00    Pack years: 23.50    Types: Cigarettes    Quit date: 09/06/2016    Years since quitting: 3.2  . Smokeless tobacco: Never Used  Substance and Sexual Activity  . Alcohol use: Yes    Alcohol/week: 0.0 standard drinks    Comment: 08/24/2016 "I'll have a couple drinks maybe 4 times/year"  . Drug use: Not Currently    Types: Marijuana    Comment: 08/24/2016 "nothing since my younger days"  . Sexual activity: Not Currently    Comment: declined condoms.   Other Topics Concern  .  Not on file  Social History Narrative  . Not on file   Social Determinants of Health   Financial Resource Strain:   . Difficulty of Paying Living Expenses:   Food Insecurity:   . Worried About Charity fundraiser in the Last Year:   . Arboriculturist in the Last Year:   Transportation Needs:   . Film/video editor (Medical):   Marland Kitchen Lack of Transportation (Non-Medical):   Physical Activity:   . Days of Exercise per Week:   . Minutes of Exercise per Session:   Stress:   . Feeling of Stress :   Social Connections:   . Frequency of Communication with Friends and Family:   . Frequency of Social Gatherings with Friends and Family:   . Attends Religious Services:   . Active Member of Clubs or  Organizations:   . Attends Archivist Meetings:   Marland Kitchen Marital Status:     Allergies  Allergen Reactions  . Ace Inhibitors Cough     Current Outpatient Medications:  .  amLODipine (NORVASC) 5 MG tablet, TAKE 1 TABLET BY MOUTH EVERY DAY, Disp: 30 tablet, Rfl: 4 .  aspirin EC 81 MG EC tablet, Take 1 tablet (81 mg total) by mouth daily., Disp: , Rfl:  .  blood glucose meter kit and supplies KIT, Dispense based on patient and insurance preference. Use up to four times daily as directed. (FOR ICD-9 250.00, 250.01)., Disp: 1 each, Rfl: 0 .  cetirizine (ZYRTEC) 10 MG tablet, Take 10 mg by mouth daily., Disp: , Rfl:  .  Insulin Glargine (LANTUS) 100 UNIT/ML Solostar Pen, Inject 10 Units into the skin daily at 10 pm. (Patient taking differently: Inject 20 Units into the skin at bedtime. ), Disp: 15 mL, Rfl: 0 .  metFORMIN (GLUCOPHAGE) 1000 MG tablet, Take 1,000 mg by mouth 2 (two) times daily., Disp: , Rfl:  .  nitroGLYCERIN (NITROSTAT) 0.4 MG SL tablet, Place 1 tablet (0.4 mg total) under the tongue every 5 (five) minutes x 3 doses as needed for chest pain., Disp: 25 tablet, Rfl: 3 .  rosuvastatin (CRESTOR) 20 MG tablet, TAKE 1 TABLET BY MOUTH EVERY DAY, Disp: 30 tablet, Rfl: 4 .  SYMTUZA 800-150-200-10 MG TABS, TAKE 1 TABLET BY MOUTH EVERY DAY WITH BREAKFAST, Disp: 30 tablet, Rfl: 4 .  triamcinolone ointment (KENALOG) 0.5 %, Apply 1 application topically 2 (two) times daily., Disp: 30 g, Rfl: 1     Review of Systems  Constitutional: Negative for activity change, appetite change, chills, diaphoresis, fatigue, fever and unexpected weight change.  HENT: Negative for congestion, rhinorrhea, sinus pressure, sneezing, sore throat and trouble swallowing.   Eyes: Negative for photophobia and visual disturbance.  Respiratory: Negative for cough, chest tightness, shortness of breath, wheezing and stridor.   Cardiovascular: Negative for chest pain, palpitations and leg swelling.   Gastrointestinal: Negative for abdominal distention, abdominal pain, anal bleeding, blood in stool, constipation, diarrhea, nausea and vomiting.  Genitourinary: Negative for difficulty urinating, dysuria, flank pain and hematuria.  Musculoskeletal: Negative for arthralgias, back pain, gait problem, joint swelling and myalgias.  Skin: Negative for color change, pallor and wound.  Neurological: Negative for dizziness, tremors, weakness and light-headedness.  Hematological: Negative for adenopathy. Does not bruise/bleed easily.  Psychiatric/Behavioral: Negative for agitation, behavioral problems, confusion, decreased concentration, dysphoric mood and sleep disturbance.       Objective:   Physical Exam Vitals and nursing note reviewed.  Constitutional:      Appearance:  He is well-developed.  HENT:     Head: Normocephalic and atraumatic.  Eyes:     Conjunctiva/sclera: Conjunctivae normal.  Cardiovascular:     Rate and Rhythm: Normal rate and regular rhythm.  Pulmonary:     Effort: Pulmonary effort is normal. No respiratory distress.     Breath sounds: No wheezing.  Abdominal:     General: There is no distension.     Palpations: Abdomen is soft.  Musculoskeletal:        General: No tenderness. Normal range of motion.     Cervical back: Normal range of motion and neck supple.  Skin:    General: Skin is warm and dry.     Coloration: Skin is not pale.     Findings: No erythema.  Neurological:     Mental Status: He is alert and oriented to person, place, and time.  Psychiatric:        Behavior: Behavior normal.        Thought Content: Thought content normal.        Judgment: Judgment normal.          Assessment & Plan:  HIV disease: Continue SYMTUZA.    Diabetes mellitus being followed by primary care  CAD: ontinue plavix, asa, beta blocker and I will intensity his statin therapy with crestor 20 mg  HTN: continue norvasc  And beta blocker  AVN: sp bilateral  THA  Hyperlipidemia: onj crestor

## 2019-12-25 ENCOUNTER — Encounter: Payer: Self-pay | Admitting: Infectious Disease

## 2020-01-23 ENCOUNTER — Other Ambulatory Visit: Payer: Self-pay | Admitting: Infectious Disease

## 2020-01-23 DIAGNOSIS — B20 Human immunodeficiency virus [HIV] disease: Secondary | ICD-10-CM

## 2020-01-23 DIAGNOSIS — I10 Essential (primary) hypertension: Secondary | ICD-10-CM

## 2020-03-05 ENCOUNTER — Ambulatory Visit: Payer: Medicare Other | Admitting: Cardiovascular Disease

## 2020-03-05 ENCOUNTER — Encounter: Payer: Self-pay | Admitting: Cardiovascular Disease

## 2020-03-05 ENCOUNTER — Other Ambulatory Visit: Payer: Self-pay

## 2020-03-05 VITALS — BP 124/72 | HR 68 | Ht 68.5 in | Wt 179.0 lb

## 2020-03-05 DIAGNOSIS — I214 Non-ST elevation (NSTEMI) myocardial infarction: Secondary | ICD-10-CM

## 2020-03-05 DIAGNOSIS — I10 Essential (primary) hypertension: Secondary | ICD-10-CM | POA: Diagnosis not present

## 2020-03-05 DIAGNOSIS — B20 Human immunodeficiency virus [HIV] disease: Secondary | ICD-10-CM | POA: Diagnosis not present

## 2020-03-05 DIAGNOSIS — E785 Hyperlipidemia, unspecified: Secondary | ICD-10-CM | POA: Diagnosis not present

## 2020-03-05 MED ORDER — ROSUVASTATIN CALCIUM 40 MG PO TABS: 40.0000 mg | ORAL_TABLET | Freq: Every day | ORAL | 3 refills | Status: DC

## 2020-03-05 NOTE — Patient Instructions (Addendum)
Medication Instructions:  INCREASE the Rosuvastatin to 40 mg once daily  *If you need a refill on your cardiac medications before your next appointment, please call your pharmacy*   Lab Work: Your provider would like for you to return in 3 months to have the following labs drawn: Fasting Lipid and CMET. You do not need an appointment for the lab. Once in our office lobby there is a podium where you can sign in and ring the doorbell to alert [place] that you are here. The lab is open from 8:00 am to 4:30 pm; closed for lunch from 12:45pm-1:45pm.  If you have labs (blood work) drawn today and your tests are completely normal, you will receive your results only by: Marland Kitchen MyChart Message (if you have MyChart) OR . A paper copy in the mail If you have any lab test that is abnormal or we need to change your treatment, we will call you to review the results.   Testing/Procedures: None ordered   Follow-Up: At [facility], you and your health needs are our priority.  As part of our continuing mission to provide you with exceptional heart care, we have created designated Provider Care Teams.  These Care Teams include your primary Cardiologist (physician) and Advanced Practice Providers (APPs -  Physician Assistants and Nurse Practitioners) who all work together to provide you with the care you need, when you need it.  We recommend signing up for the patient portal called "MyChart".  Sign up information is provided on this After Visit Summary.  MyChart is used to connect with patients for Virtual Visits (Telemedicine).  Patients are able to view lab/test results, encounter notes, upcoming appointments, etc.  Non-urgent messages can be sent to your provider as well.   To learn more about what you can do with MyChart, go to [email or web address].    Your next appointment:   12 month(s)  The format for your next appointment:   In Person  Provider:   You may see Dr. Tresa Endo or one of the following  Advanced Practice Providers on your designated Care Team:    Azalee Course, PA-C  Micah Flesher, PA-C or   Judy Pimple, [place]

## 2020-03-05 NOTE — Progress Notes (Signed)
Cardiology Office Note    Date:  03/05/2020   ID:  Wayne Santos, DOB [birth year], MRN [id number]  PCP:  Wayne Ebbs, MD  Cardiologist:  Wayne Majestic, MD   No chief complaint on file.   History of Present Illness:  Wayne Santos is a 69 y.o. male who presents to the office for a 2 1/2 year follow-up cardiology evaluation.  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  Troponins were mildly positive at 0.27.  He underwent cardiac catheterization by Dr. Velva Santos and was found to have a 95% mid circumflex stenosis which was successfully stented with a 2.530 mm Resolute onyx DES stent.  He had mild concomitant CAD with 30% proximal and mid RCA stenoses, 60% diagonal 1, 80% diagonal 2 stenoses and small vessels with mild 40% proximal LAD narrowing, irregularity in the circumflex marginal branches.     He quit smoking on August 14, 2006.  He initially participated in cardiac rehab.  When I saw him for initial evaluation with me in October 2018 he was without recurrent anginal symptoms.  He was on amlodipine 5 mg in addition to metoprolol 12.5 twice a day.  He was also on pravastatin for hyperlipidemia.  He underwent subsequent laboratory in January 2019.  LDL was 113.  He was taken off pravastatin and switch to rosuvastatin 20 mg.    I last saw him in April 2008 prior to his he had lost approximately 50 pounds of weight after developing diabetes mellitus.  Hemoglobin A1c had increased almost 13.  He was started on insulin in addition to metformin which has since been titrated up to 1000 mg twice a day.  He continues to see Dr. Drucilla Santos with reference to his HIV.  He was without recurrent anginal symptomatology began to exercise regularly.  Over the past several years, he has continued to be stable from a cardiovascular standpoint.  He specifically denies any chest pain or exertional  shortness of breath.  He is followed closely by infectious disease and has nondetectable virus on Symtuza I last saw Dr. Tommy Santos in August 2021.  When I had seen him in 2019 he had lost weight down to 160 pounds.  With the COVID-19 pandemic he has not been going back to the gym and admits to some associated weight gain.  He continues to be asymptomatic without chest pain PND orthopnea, palpitations presyncope or syncope.  He has continued to be on amlodipine 5 mg and rosuvastatin 20 mg daily.  He is on insulin and Metformin for his diabetes mellitus.  He presents for evaluation.  Past Medical History:  Diagnosis Date  . Anemia   . AVN (avascular necrosis of bone) (Wayne Santos)   . Benign paroxysmal positional vertigo 06/15/2015  . CAD (coronary artery disease), native coronary artery    a. 08/24/16 NSTEMI/PCI: LM nl, LAD 40p, D1 60ost, D2 80ost-small, LCX 34m(2.5x30 ONyx DES), OM1/2 min irregs, OM3 nl, RCA 30p/m, RPDA/RPAV/RPL1/RPL2/RPL3 nl, EF 55-65%.  . Childhood asthma   . GERD (gastroesophageal reflux disease)   . Glaucoma   . History of pneumonia    "4 times in my life" (08/24/2016)  . Hyperlipidemia   . Hypertension   . Memory impairment 06/15/2015  . Normochromic normocytic anemia 12/16/2019  . Osteoporosis   . Poison ivy 12/26/2018  . Primary localized osteoarthrosis of left hip 07/05/2016  . Seasonal allergies   .  Tobacco abuse     Past Surgical History:  Procedure Laterality Date  . CARDIAC CATHETERIZATION    . CORONARY ANGIOPLASTY WITH STENT PLACEMENT  08/24/2016  . CORONARY STENT INTERVENTION N/A 08/24/2016   Procedure: Coronary Stent Intervention;  Surgeon: Wayne Hampshire, MD;  Location: [facility];  Service: Cardiovascular;  Laterality: N/A;  . HAND NERVE REPAIR Right 1990s?   "caught it in a machine; had to be reconstructed"  . JOINT REPLACEMENT    . LEFT HEART CATH AND CORONARY ANGIOGRAPHY N/A 08/24/2016   Procedure: Left Heart Cath and Coronary Angiography;  Surgeon:  Wayne Hampshire, MD;  Location: [facility];  Service: Cardiovascular;  Laterality: N/A;  . TOTAL HIP ARTHROPLASTY Right   . TOTAL HIP ARTHROPLASTY Left 07/05/2016   Procedure: TOTAL HIP ARTHROPLASTY ANTERIOR APPROACH;  Surgeon: Wayne Nakayama, MD;  Location: [facility];  Service: Orthopedics;  Laterality: Left;    Current Medications: Outpatient Medications Prior to Visit  Medication Sig Dispense Refill  . amLODipine (NORVASC) 5 MG tablet TAKE 1 TABLET BY MOUTH EVERY DAY 30 tablet 4  . aspirin EC 81 MG EC tablet Take 1 tablet (81 mg total) by mouth daily.    . blood glucose meter kit and supplies KIT Dispense based on patient and insurance preference. Use up to four times daily as directed. (FOR ICD-9 250.00, 250.01). 1 each 0  . cetirizine (ZYRTEC) 10 MG tablet Take 10 mg by mouth daily.    . fluticasone (FLONASE) 50 MCG/ACT nasal spray SMARTSIG:2 Spray(s) Both Nares Daily PRN    . Insulin Glargine (LANTUS) 100 UNIT/ML Solostar Pen Inject 10 Units into the skin daily at 10 pm. (Patient taking differently: Inject 20 Units into the skin at bedtime. ) 15 mL 0  . metFORMIN (GLUCOPHAGE) 1000 MG tablet Take 1,000 mg by mouth 2 (two) times daily.    . nitroGLYCERIN (NITROSTAT) 0.4 MG SL tablet Place 1 tablet (0.4 mg total) under the tongue every 5 (five) minutes x 3 doses as needed for chest pain. 25 tablet 3  . SYMTUZA 800-150-200-10 MG TABS TAKE 1 TABLET BY MOUTH EVERY DAY WITH BREAKFAST 30 tablet 4  . Vitamin D, Ergocalciferol, (DRISDOL) 1.25 MG (50000 UNIT) CAPS capsule Take 50,000 Units by mouth once a week.    . rosuvastatin (CRESTOR) 20 MG tablet TAKE 1 TABLET BY MOUTH EVERY DAY 30 tablet 4  . triamcinolone ointment (KENALOG) 0.5 % Apply 1 application topically 2 (two) times daily. (Patient not taking: Reported on 03/05/2020) 30 g 1   No facility-administered medications prior to visit.     Allergies:   Ace inhibitors   Social History   Socioeconomic History  . Marital status:  Single    Spouse name: Not on file  . Number of children: Not on file  . Years of education: Not on file  . Highest education level: Not on file  Occupational History  . Not on file  Tobacco Use  . Smoking status: Former Smoker    Packs/day: 0.50    Years: 47.00    Pack years: 23.50    Types: Cigarettes    Quit date: 09/06/2016    Years since quitting: 3.4  . Smokeless tobacco: Never Used  Substance and Sexual Activity  . Alcohol use: Yes    Alcohol/week: 0.0 standard drinks    Comment: 08/24/2016 "I'll have a couple drinks maybe 4 times/year"  . Drug use: Not Currently    Types: Marijuana  Comment: 08/24/2016 "nothing since my younger days"  . Sexual activity: Not Currently    Comment: declined condoms.   Other Topics Concern  . Not on file  Social History Narrative  . Not on file   Social Determinants of Health   Financial Resource Strain:   . Difficulty of Paying Living Expenses: Not on file  Food Insecurity:   . Worried About Charity fundraiser in the Last Year: Not on file  . Ran Out of Food in the Last Year: Not on file  Transportation Needs:   . Lack of Transportation (Medical): Not on file  . Lack of Transportation (Non-Medical): Not on file  Physical Activity:   . Days of Exercise per Week: Not on file  . Minutes of Exercise per Session: Not on file  Stress:   . Feeling of Stress : Not on file  Social Connections:   . Frequency of Communication with Friends and Family: Not on file  . Frequency of Social Gatherings with Friends and Family: Not on file  . Attends Religious Services: Not on file  . Active Member of Clubs or Organizations: Not on file  . Attends Archivist Meetings: Not on file  . Marital Status: Not on file     Family History:  The patient's family history is not on file.   He admits to some family history of CVD.  ROS General: Negative; No fevers, chills, or night sweats;  HEENT: Negative; No changes in vision or hearing,  sinus congestion, difficulty swallowing Pulmonary: Negative; No cough, wheezing, shortness of breath, hemoptysis Cardiovascular:  GI: Negative; No nausea, vomiting, diarrhea, or abdominal pain GU: Negative; No dysuria, hematuria, or difficulty voiding Musculoskeletal: Negative; no myalgias, joint pain, or weakness Hematologic/Oncology: Negative; no easy bruising, bleeding Infectious disease: Positive for HIV since the late 1980s or early 1990s Endocrine: Negative; no heat/cold intolerance; no diabetes Neuro: Negative; no changes in balance, headaches Skin: Negative; No rashes or skin lesions Psychiatric: Negative; No behavioral problems, depression Sleep: Negative; No snoring, daytime sleepiness, hypersomnolence, bruxism, restless legs, hypnogognic hallucinations, no cataplexy Other comprehensive 14 point system review is negative.   PHYSICAL EXAM:   VS:  BP 124/72   Pulse 68   Ht 5' 8.5" (1.74 m)   Wt 179 lb (81.2 kg)   SpO2 98%   BMI 26.82 kg/m     Repeat blood pressure by me was 126/66  Wt Readings from Last 3 Encounters:  03/05/20 179 lb (81.2 kg)  12/16/19 179 lb (81.2 kg)  07/08/19 181 lb (82.1 kg)    General: Alert, oriented, no distress.  Skin: normal turgor, no rashes, warm and dry HEENT: Normocephalic, atraumatic. Pupils equal round and reactive to light; sclera anicteric; extraocular muscles intact;  Nose without nasal septal hypertrophy Mouth/Parynx benign; Mallinpatti scale 3 Neck: No JVD, no carotid bruits; normal carotid upstroke Lungs: clear to ausculatation and percussion; no wheezing or rales Chest wall: without tenderness to palpitation Heart: PMI not displaced, RRR, s1 s2 normal, 1/6 systolic murmur, no diastolic murmur, no rubs, gallops, thrills, or heaves Abdomen: soft, nontender; no hepatosplenomehaly, BS+; abdominal aorta nontender and not dilated by palpation. Back: no CVA tenderness Pulses 2+ Musculoskeletal: full range of motion, normal strength,  no joint deformities Extremities: no clubbing cyanosis or edema, Homan's sign negative  Neurologic: grossly nonfocal; Cranial nerves grossly wnl Psychologic: Normal mood and affect   Studies/Labs Reviewed:   EKG:  EKG is ordered today.  ECG (independently read by me): Normal  sinus rhythm at 68 bpm.  No ectopy.  Normal intervals.  No significant ST segment changes.  April 2019 ECG (independently read by me): Normal sinus rhythm at 73 bpm.  No ectopy.  Normal intervals.  August 2018 ECG (independently read by me): Sinus bradycardia 53 bpm.  Nonspecific T-wave abnormality.  Normal intervals.  No ectopy.  Recent Labs: BMP Latest Ref Rng & Units 12/02/2019 06/10/2019 12/05/2018  Glucose 65 - 99 mg/dL 97 129(H) 163(H)  BUN 7 - 25 mg/dL 12 9 14   Creatinine 0.70 - 1.25 mg/dL 1.13 0.92 1.03  BUN/Creat Ratio 6 - 22 (calc) NOT APPLICABLE NOT APPLICABLE NOT APPLICABLE  Sodium 287 - 146 mmol/L 141 142 140  Potassium 3.5 - 5.3 mmol/L 3.7 3.9 3.8  Chloride 98 - 110 mmol/L 104 105 103  CO2 20 - 32 mmol/L 25 27 25   Calcium 8.6 - 10.3 mg/dL 9.7 9.9 10.4(H)     Hepatic Function Latest Ref Rng & Units 12/02/2019 06/10/2019 12/05/2018  Total Protein 6.1 - 8.1 g/dL 7.2 7.7 7.5  Albumin 3.5 - 5.0 g/dL - - -  AST 10 - 35 U/L 14 14 14   ALT 9 - 46 U/L 12 14 11   Alk Phosphatase 38 - 126 U/L - - -  Total Bilirubin 0.2 - 1.2 mg/dL 0.3 0.3 0.3  Bilirubin, Direct - - - -    CBC Latest Ref Rng & Units 12/02/2019 06/10/2019 12/05/2018  WBC 3.8 - 10.8 Thousand/uL 6.9 7.3 6.2  Hemoglobin 13.2 - 17.1 g/dL 9.4(L) 11.2(L) 12.1(L)  Hematocrit 38 - 50 % 30.4(L) 34.8(L) 36.0(L)  Platelets 140 - 400 Thousand/uL 337 318 284   Lab Results  Component Value Date   MCV 76.0 (L) 12/02/2019   MCV 82.1 06/10/2019   MCV 86.3 12/05/2018   Lab Results  Component Value Date   TSH 0.788 12/21/2009   Lab Results  Component Value Date   HGBA1C 6.1 (H) 08/24/2016     BNP No results found for: BNP  ProBNP No results found  for: PROBNP   Lipid Panel     Component Value Date/Time   CHOL 153 06/10/2019 0956   TRIG 89 06/10/2019 0956   HDL 47 06/10/2019 0956   CHOLHDL 3.3 06/10/2019 0956   VLDL 25 08/24/2016 0648   LDLCALC 88 06/10/2019 0956   LDLDIRECT 113 (H) 06/05/2017 1624     RADIOLOGY: No results found.   Additional studies/ records that were reviewed today include:  I reviewed his hospital records from his 2018 admission.  I reviewed the catheterization report and subsequent office visit. Recent laboratory was reviewed.  Records of Dr. Tommy Santos reviewed.  ASSESSMENT:    1. NSTEMI (non-ST elevated myocardial infarction) ([place]): 08/2016, DES stent to LCX.   2. HIV disease ([place])   3. Essential hypertension   4. Hyperlipidemia with target LDL less than 70      PLAN:  [name] is a 69 year old American male who has a ~34 year history of HIV which has been well treated with undetectable virus.  He has a history of hypertension, hyperlipidemia, and remote tobacco and fortunately quit smoking following his non-ST segment elevation myocardial infarction in April 2018.   He underwent successful stenting of a 95% mid circumflex stenosis with a Resolute DES stent.  He had mild concomitant CAD involving the RCA, diagonal and proximal LAD.  Over the last several years he has continued to be asymptomatic and has been without recurrent anginal symptoms following his intervention.  His blood pressure today is stable and well-controlled on amlodipine 5 mg.  His resting pulse is 68 and ECG does not show any ischemic changes or evidence for prior MI.  He has been on rosuvastatin 20 mg for hyperlipidemia.  He sees Dr. Nolene Santos for primary care.  LDL cholesterol in February 2021 was 88.  I have suggested a target LDL of less than 70 and have recommended further titration of rosuvastatin to 40 mg.  His HIV is stable and he has undetectable virus on combination therapy with Symtuza.  He is diabetic on Metformin  and insulin and he had had recent laboratory by his primary physician.  He is planning to increase his activity level since he has gained weight by not exercising at the gym as he had had previously.  I am recommending follow-up comprehensive metabolic panel lipid studies in 3 months.  I will contact him regarding the results and we will see him in 1 year for follow-up evaluation or sooner as needed.   Medication Adjustments/Labs and Tests Ordered: Current medicines are reviewed at length with the patient today.  Concerns regarding medicines are outlined above.  Medication changes, Labs and Tests ordered today are listed in the Patient Instructions below. Patient Instructions  Medication Instructions:  INCREASE the Rosuvastatin to 40 mg once daily  *If you need a refill on your cardiac medications before your next appointment, please call your pharmacy*   Lab Work: Your provider would like for you to return in 3 months to have the following labs drawn: Fasting Lipid and CMET. You do not need an appointment for the lab. Once in our office lobby there is a podium where you can sign in and ring the doorbell to alert [place] that you are here. The lab is open from 8:00 am to 4:30 pm; closed for lunch from 12:45pm-1:45pm.  If you have labs (blood work) drawn today and your tests are completely normal, you will receive your results only by: Marland Kitchen MyChart Message (if you have MyChart) OR . A paper copy in the mail If you have any lab test that is abnormal or we need to change your treatment, we will call you to review the results.   Testing/Procedures: None ordered   Follow-Up: At [facility], you and your health needs are our priority.  As part of our continuing mission to provide you with exceptional heart care, we have created designated Provider Care Teams.  These Care Teams include your primary Cardiologist (physician) and Advanced Practice Providers (APPs -  Physician Assistants and Nurse  Practitioners) who all work together to provide you with the care you need, when you need it.  We recommend signing up for the patient portal called "MyChart".  Sign up information is provided on this After Visit Summary.  MyChart is used to connect with patients for Virtual Visits (Telemedicine).  Patients are able to view lab/test results, encounter notes, upcoming appointments, etc.  Non-urgent messages can be sent to your provider as well.   To learn more about what you can do with MyChart, go to [email or web address].    Your next appointment:   12 month(s)  The format for your next appointment:   In Person  Provider:   You may see Dr. Claiborne Billings or one of the following Advanced Practice Providers on your designated Care Team:    Almyra Deforest, PA-C  Fabian Sharp, PA-C or   Roby Lofts, PA-C      Signed, Wayne Majestic, MD, [facility]  03/05/2020 10:10 AM    [facility] [address] [place], [place], [place]  [address] Phone: [phone]

## 2020-05-04 ENCOUNTER — Other Ambulatory Visit: Payer: Medicare Other

## 2020-05-04 ENCOUNTER — Other Ambulatory Visit: Payer: Self-pay

## 2020-05-04 DIAGNOSIS — B20 Human immunodeficiency virus [HIV] disease: Secondary | ICD-10-CM

## 2020-05-05 LAB — T-HELPER CELL (CD4) - (RCID CLINIC ONLY)
CD4 % Helper T Cell: 33 % (ref 33–65)
CD4 T Cell Abs: 682 /uL (ref 400–1790)

## 2020-05-15 LAB — CBC WITH DIFFERENTIAL/PLATELET
Absolute Monocytes: 384 cells/uL (ref 200–950)
Basophils Absolute: 89 cells/uL (ref 0–200)
Basophils Relative: 1.5 %
Eosinophils Absolute: 271 cells/uL (ref 15–500)
Eosinophils Relative: 4.6 %
HCT: 30.8 % — ABNORMAL LOW (ref 38.5–50.0)
Hemoglobin: 9.1 g/dL — ABNORMAL LOW (ref 13.2–17.1)
Lymphs Abs: 2106 cells/uL (ref 850–3900)
MCH: 20.4 pg — ABNORMAL LOW (ref 27.0–33.0)
MCHC: 29.5 g/dL — ABNORMAL LOW (ref 32.0–36.0)
MCV: 69.1 fL — ABNORMAL LOW (ref 80.0–100.0)
MPV: 9.9 fL (ref 7.5–12.5)
Monocytes Relative: 6.5 %
Neutro Abs: 3050 cells/uL (ref 1500–7800)
Neutrophils Relative %: 51.7 %
Platelets: 343 10*3/uL (ref 140–400)
RBC: 4.46 10*6/uL (ref 4.20–5.80)
RDW: 17 % — ABNORMAL HIGH (ref 11.0–15.0)
Total Lymphocyte: 35.7 %
WBC: 5.9 10*3/uL (ref 3.8–10.8)

## 2020-05-15 LAB — LIPID PANEL
Cholesterol: 120 mg/dL (ref <200)
HDL: 44 mg/dL (ref >=40)
LDL Cholesterol (Calc): 55 mg/dL (calc)
Non-HDL Cholesterol (Calc): 76 mg/dL (calc) (ref <130)
Total CHOL/HDL Ratio: 2.7 (calc) (ref <5.0)
Triglycerides: 127 mg/dL (ref <150)

## 2020-05-15 LAB — COMPLETE METABOLIC PANEL WITH GFR
AG Ratio: 1.6 (calc) (ref 1.0–2.5)
ALT: 13 U/L (ref 9–46)
AST: 17 U/L (ref 10–35)
Albumin: 4.5 g/dL (ref 3.6–5.1)
Alkaline phosphatase (APISO): 71 U/L (ref 35–144)
BUN: 9 mg/dL (ref 7–25)
CO2: 26 mmol/L (ref 20–32)
Calcium: 9.7 mg/dL (ref 8.6–10.3)
Chloride: 106 mmol/L (ref 98–110)
Creat: 0.82 mg/dL (ref 0.70–1.25)
GFR, Est African American: 105 mL/min/{1.73_m2} (ref >=60)
GFR, Est Non African American: 91 mL/min/{1.73_m2} (ref >=60)
Globulin: 2.8 g/dL (calc) (ref 1.9–3.7)
Glucose, Bld: 134 mg/dL — ABNORMAL HIGH (ref 65–99)
Potassium: 3.6 mmol/L (ref 3.5–5.3)
Sodium: 142 mmol/L (ref 135–146)
Total Bilirubin: 0.2 mg/dL (ref 0.2–1.2)
Total Protein: 7.3 g/dL (ref 6.1–8.1)

## 2020-05-15 LAB — HIV-1 RNA QUANT-NO REFLEX-BLD
HIV 1 RNA Quant: <20 Copies/mL
HIV-1 RNA Quant, Log: <1.3 Log cps/mL

## 2020-05-15 LAB — CBC MORPHOLOGY

## 2020-05-20 ENCOUNTER — Other Ambulatory Visit: Payer: Self-pay

## 2020-05-20 ENCOUNTER — Ambulatory Visit (INDEPENDENT_AMBULATORY_CARE_PROVIDER_SITE_OTHER): Payer: Medicare Other | Admitting: Infectious Disease

## 2020-05-20 ENCOUNTER — Encounter: Payer: Self-pay | Admitting: Infectious Disease

## 2020-05-20 VITALS — BP 147/82 | HR 102 | Temp 98.9°F | Wt 161.0 lb

## 2020-05-20 DIAGNOSIS — B20 Human immunodeficiency virus [HIV] disease: Secondary | ICD-10-CM | POA: Diagnosis not present

## 2020-05-20 DIAGNOSIS — E7800 Pure hypercholesterolemia, unspecified: Secondary | ICD-10-CM

## 2020-05-20 DIAGNOSIS — M8700 Idiopathic aseptic necrosis of unspecified bone: Secondary | ICD-10-CM | POA: Diagnosis not present

## 2020-05-20 DIAGNOSIS — R42 Dizziness and giddiness: Secondary | ICD-10-CM

## 2020-05-20 DIAGNOSIS — I2510 Atherosclerotic heart disease of native coronary artery without angina pectoris: Secondary | ICD-10-CM

## 2020-05-20 HISTORY — DX: Dizziness and giddiness: R42

## 2020-05-20 MED ORDER — BIKTARVY 50-200-25 MG PO TABS: 1.0000 | ORAL_TABLET | Freq: Every day | ORAL | 11 refills | Status: DC

## 2020-05-20 NOTE — Progress Notes (Signed)
Chief complaint: followup for HIV on medications  Subjective:    Patient ID: Wayne Santos, male    DOB: [birth year], 70 y.o.   MRN: [id number]  HPI  Wayne Santos is a 70 y.o. male who is doing superbly well on his  antiviral regimen, SYMTUZA  Wayne Santos was diagnosed in 1987 he tells me and had a CD4 nadir then and then once he went on an ARV holiday per Dr. Ruffin Frederick instructions.  He is otherwise maintain strict adherence to his antivirals and remained undetectable  Wayne Santos continues to do quite well on Symtuza with undetectable viral load.  He will renewed his SPAP program.  He has gotten COVID-19 vaccinated but needs booster of Moderna.  Been seen by cardiology who increased his Crestor dose to 40 mg note this is higher than the recommended dose with the booster of cobicistat that is in his [place].  Therefore I would like to switch him off this regimen to a different 1.  I have no documentation of urological failure with resistance though he has been on Proteus inhibitor therapy for many decades.  I think we could switch him to [place] 1 pill once a day with success and we will make that change today.        Past Medical History:  Diagnosis Date  . Anemia   . AVN (avascular necrosis of bone) ([place])   . Benign paroxysmal positional vertigo 06/15/2015  . CAD (coronary artery disease), native coronary artery    a. 08/24/16 NSTEMI/PCI: LM nl, LAD 40p, D1 60ost, D2 80ost-small, LCX 21m(2.5x30 ONyx DES), OM1/2 min irregs, OM3 nl, RCA 30p/m, RPDA/RPAV/RPL1/RPL2/RPL3 nl, EF 55-65%.  . Childhood asthma   . GERD (gastroesophageal reflux disease)   . Glaucoma   . History of pneumonia    "4 times in my life" (08/24/2016)  . Hyperlipidemia   . Hypertension   . Memory impairment 06/15/2015  . Normochromic normocytic anemia 12/16/2019  . Osteoporosis   . Poison ivy 12/26/2018  . Primary localized osteoarthrosis of left hip 07/05/2016  . Seasonal allergies   . Tobacco abuse     Past  Surgical History:  Procedure Laterality Date  . CARDIAC CATHETERIZATION    . CORONARY ANGIOPLASTY WITH STENT PLACEMENT  08/24/2016  . CORONARY STENT INTERVENTION N/A 08/24/2016   Procedure: Coronary Stent Intervention;  Surgeon: MWellington Hampshire MD;  [facility];  Service: Cardiovascular;  Laterality: N/A;  . HAND NERVE REPAIR Right 1990s?   "caught it in a machine; had to be reconstructed"  . JOINT REPLACEMENT    . LEFT HEART CATH AND CORONARY ANGIOGRAPHY N/A 08/24/2016   Procedure: Left Heart Cath and Coronary Angiography;  Surgeon: MWellington Hampshire MD;  [facility];  Service: Cardiovascular;  Laterality: N/A;  . TOTAL HIP ARTHROPLASTY Right   . TOTAL HIP ARTHROPLASTY Left 07/05/2016   Procedure: TOTAL HIP ARTHROPLASTY ANTERIOR APPROACH;  Surgeon: PMelrose Nakayama MD;  [facility]  Service: Orthopedics;  Laterality: Left;    No family history on file.    Social History   Socioeconomic History  . Marital status: Single    Spouse name: Not on file  . Number of children: Not on file  . Years of education: Not on file  . Highest education level: Not on file  Occupational History  . Not on file  Tobacco Use  . Smoking status: Former Smoker    Packs/day: 0.50    Years: 47.00  Pack years: 23.50    Types: Cigarettes    Quit date: 09/06/2016    Years since quitting: 3.7  . Smokeless tobacco: Never Used  Substance and Sexual Activity  . Alcohol use: Yes    Alcohol/week: 0.0 standard drinks    Comment: 08/24/2016 "I'll have a couple drinks maybe 4 times/year"  . Drug use: Not Currently    Types: Marijuana    Comment: 08/24/2016 "nothing since my younger days"  . Sexual activity: Not Currently    Comment: declined condoms.   Other Topics Concern  . Not on file  Social History Narrative  . Not on file   Social Determinants of Health   Financial Resource Strain: Not on file  Food Insecurity: Not on file  Transportation Needs: Not on file   Physical Activity: Not on file  Stress: Not on file  Social Connections: Not on file    Allergies  Allergen Reactions  . Ace Inhibitors Cough     Current Outpatient Medications:  .  amLODipine (NORVASC) 5 MG tablet, TAKE 1 TABLET BY MOUTH EVERY DAY, Disp: 30 tablet, Rfl: 4 .  aspirin EC 81 MG EC tablet, Take 1 tablet (81 mg total) by mouth daily., Disp: , Rfl:  .  blood glucose meter kit and supplies KIT, Dispense based on patient and insurance preference. Use up to four times daily as directed. (FOR ICD-9 250.00, 250.01)., Disp: 1 each, Rfl: 0 .  cetirizine (ZYRTEC) 10 MG tablet, Take 10 mg by mouth daily., Disp: , Rfl:  .  fluticasone (FLONASE) 50 MCG/ACT nasal spray, SMARTSIG:2 Spray(s) Both Nares Daily PRN, Disp: , Rfl:  .  Insulin Glargine (LANTUS) 100 UNIT/ML Solostar Pen, Inject 10 Units into the skin daily at 10 pm. (Patient taking differently: Inject 20 Units into the skin at bedtime. ), Disp: 15 mL, Rfl: 0 .  metFORMIN (GLUCOPHAGE) 1000 MG tablet, Take 1,000 mg by mouth 2 (two) times daily., Disp: , Rfl:  .  nitroGLYCERIN (NITROSTAT) 0.4 MG SL tablet, Place 1 tablet (0.4 mg total) under the tongue every 5 (five) minutes x 3 doses as needed for chest pain., Disp: 25 tablet, Rfl: 3 .  rosuvastatin (CRESTOR) 40 MG tablet, Take 1 tablet (40 mg total) by mouth daily., Disp: 90 tablet, Rfl: 3 .  SYMTUZA 800-150-200-10 MG TABS, TAKE 1 TABLET BY MOUTH EVERY DAY WITH BREAKFAST, Disp: 30 tablet, Rfl: 4 .  Vitamin D, Ergocalciferol, (DRISDOL) 1.25 MG (50000 UNIT) CAPS capsule, Take 50,000 Units by mouth once a week., Disp: , Rfl:      Review of Systems  Constitutional: Negative for activity change, appetite change, chills, diaphoresis, fatigue, fever and unexpected weight change.  HENT: Negative for congestion, rhinorrhea, sinus pressure, sneezing, sore throat and trouble swallowing.   Eyes: Negative for photophobia and visual disturbance.  Respiratory: Negative for cough, chest  tightness, shortness of breath, wheezing and stridor.   Cardiovascular: Negative for chest pain, palpitations and leg swelling.  Gastrointestinal: Negative for abdominal distention, abdominal pain, anal bleeding, blood in stool, constipation, diarrhea, nausea and vomiting.  Genitourinary: Negative for difficulty urinating, dysuria, flank pain and hematuria.  Musculoskeletal: Negative for arthralgias, back pain, gait problem, joint swelling and myalgias.  Skin: Negative for color change, pallor and wound.  Neurological: Positive for headaches. Negative for dizziness, tremors, weakness and light-headedness.  Hematological: Negative for adenopathy. Does not bruise/bleed easily.  Psychiatric/Behavioral: Negative for agitation, behavioral problems, confusion, decreased concentration, dysphoric mood and sleep disturbance.  Objective:   Physical Exam Vitals and nursing note reviewed.  Constitutional:      Appearance: He is well-developed.  HENT:     Head: Normocephalic and atraumatic.  Eyes:     Conjunctiva/sclera: Conjunctivae normal.  Cardiovascular:     Rate and Rhythm: Normal rate and regular rhythm.  Pulmonary:     Effort: Pulmonary effort is normal. No respiratory distress.     Breath sounds: No wheezing.  Abdominal:     General: There is no distension.     Palpations: Abdomen is soft.  Musculoskeletal:        General: No tenderness. Normal range of motion.     Cervical back: Normal range of motion and neck supple.  Skin:    General: Skin is warm and dry.     Coloration: Skin is not pale.     Findings: No erythema.  Neurological:     Mental Status: He is alert and oriented to person, place, and time.  Psychiatric:        Behavior: Behavior normal.        Thought Content: Thought content normal.        Judgment: Judgment normal.          Assessment & Plan:  HIV disease: Switch to [place] recheck labs in a month and have him see me shortly thereafter  Diabetes  mellitus being followed by primary care, if he has problems with metformin toxicity in the context of him being on BIKTARVY and metformin will need to drop the metformin dose.  He is on insulin already as it is.  CAD: ontinue plavix, asa, beta blocker , accommodate his higher dose of Crestor we will take him off SYMTUZA.  HTN: continue norvasc  And beta blocker  AVN: sp bilateral THA  Hyperlipidemia: onj crestor  Headaches and some lightheadedness this occurred in the context of him being upset and likely having elevated blood pressure.  Is improved now  I spent greater than 40 minutes with the patient including greater than 50% of time in face to face counsel of the patient and his past and current antiretroviral regimen the nature of HIV resistance mutations and of different regimens and barrier to resistance need for medication of drug drug interaction and coordination of his care.

## 2020-06-24 ENCOUNTER — Other Ambulatory Visit: Payer: Self-pay | Admitting: Infectious Disease

## 2020-06-24 DIAGNOSIS — I10 Essential (primary) hypertension: Secondary | ICD-10-CM

## 2020-07-02 ENCOUNTER — Ambulatory Visit (INDEPENDENT_AMBULATORY_CARE_PROVIDER_SITE_OTHER): Payer: Medicare Other | Admitting: Infectious Disease

## 2020-07-02 ENCOUNTER — Encounter: Payer: Self-pay | Admitting: Infectious Disease

## 2020-07-02 ENCOUNTER — Other Ambulatory Visit: Payer: Self-pay

## 2020-07-02 VITALS — BP 154/83 | HR 96 | Temp 98.1°F | Ht 68.0 in | Wt 178.0 lb

## 2020-07-02 DIAGNOSIS — B20 Human immunodeficiency virus [HIV] disease: Secondary | ICD-10-CM

## 2020-07-02 DIAGNOSIS — E7800 Pure hypercholesterolemia, unspecified: Secondary | ICD-10-CM

## 2020-07-02 DIAGNOSIS — I10 Essential (primary) hypertension: Secondary | ICD-10-CM

## 2020-07-02 DIAGNOSIS — M8700 Idiopathic aseptic necrosis of unspecified bone: Secondary | ICD-10-CM | POA: Diagnosis not present

## 2020-07-02 DIAGNOSIS — I2510 Atherosclerotic heart disease of native coronary artery without angina pectoris: Secondary | ICD-10-CM | POA: Diagnosis not present

## 2020-07-02 NOTE — Progress Notes (Signed)
Chief complaint: followup for HIV on medications  Subjective:    Patient ID: Wayne Santos, male    DOB: 06-18-1950, 70 y.o.   MRN: [id number]  HPI  Wayne Santos is a 70 y.o. male who is doing superbly well on his  antiviral regimen, SYMTUZA  [name] was diagnosed in 1987 he tells me and had a CD4 nadir then and then once he went on an ARV holiday per Dr. Ruffin Frederick instructions.  He is otherwise maintain strict adherence to his antivirals and remained undetectable  Aldon continues to do quite well on Symtuza with undetectable viral load.  He will renewed his SPAP program.  He has gotten COVID-19 vaccinated and boosted  Been seen by cardiology who increased his Crestor dose to 40 mg note this is higher than the recommended dose with the booster of cobicistat that is in his [place].  I therefore switched him off of [place] and onto Biktarvy to allow him to be on this high dose of Crestor.  He is here and believes that he had labs done 2 weeks ago but we cannot locate them in the computer or in talking to the lab.          Past Medical History:  Diagnosis Date  . Anemia   . AVN (avascular necrosis of bone) ([place])   . Benign paroxysmal positional vertigo 06/15/2015  . CAD (coronary artery disease), native coronary artery    a. 08/24/16 NSTEMI/PCI: LM nl, LAD 40p, D1 60ost, D2 80ost-small, LCX 46m(2.5x30 ONyx DES), OM1/2 min irregs, OM3 nl, RCA 30p/m, RPDA/RPAV/RPL1/RPL2/RPL3 nl, EF 55-65%.  . Childhood asthma   . Dizziness 05/20/2020  . GERD (gastroesophageal reflux disease)   . Glaucoma   . History of pneumonia    "4 times in my life" (08/24/2016)  . Hyperlipidemia   . Hypertension   . Memory impairment 06/15/2015  . Normochromic normocytic anemia 12/16/2019  . Osteoporosis   . Poison ivy 12/26/2018  . Primary localized osteoarthrosis of left hip 07/05/2016  . Seasonal allergies   . Tobacco abuse     Past Surgical History:  Procedure Laterality Date  . CARDIAC  CATHETERIZATION    . CORONARY ANGIOPLASTY WITH STENT PLACEMENT  08/24/2016  . CORONARY STENT INTERVENTION N/A 08/24/2016   Procedure: Coronary Stent Intervention;  Surgeon: MWellington Hampshire MD;  [facility];  Service: Cardiovascular;  Laterality: N/A;  . HAND NERVE REPAIR Right 1990s?   "caught it in a machine; had to be reconstructed"  . JOINT REPLACEMENT    . LEFT HEART CATH AND CORONARY ANGIOGRAPHY N/A 08/24/2016   Procedure: Left Heart Cath and Coronary Angiography;  Surgeon: MWellington Hampshire MD;  [facility];  Service: Cardiovascular;  Laterality: N/A;  . TOTAL HIP ARTHROPLASTY Right   . TOTAL HIP ARTHROPLASTY Left 07/05/2016   Procedure: TOTAL HIP ARTHROPLASTY ANTERIOR APPROACH;  Surgeon: PMelrose Nakayama MD;  [facility]  Service: Orthopedics;  Laterality: Left;    No family history on file.    Social History   Socioeconomic History  . Marital status: Single    Spouse name: Not on file  . Number of children: Not on file  . Years of education: Not on file  . Highest education level: Not on file  Occupational History  . Not on file  Tobacco Use  . Smoking status: Former Smoker    Packs/day: 0.50    Years: 47.00    Pack years: 23.50  Types: Cigarettes    Quit date: 09/06/2016    Years since quitting: 3.8  . Smokeless tobacco: Never Used  Substance and Sexual Activity  . Alcohol use: Yes    Alcohol/week: 0.0 standard drinks    Comment: 08/24/2016 "I'll have a couple drinks maybe 4 times/year"  . Drug use: Not Currently    Types: Marijuana    Comment: 08/24/2016 "nothing since my younger days"  . Sexual activity: Not Currently    Comment: declined condoms.   Other Topics Concern  . Not on file  Social History Narrative  . Not on file   Social Determinants of Health   Financial Resource Strain: Not on file  Food Insecurity: Not on file  Transportation Needs: Not on file  Physical Activity: Not on file  Stress: Not on file   Social Connections: Not on file    Allergies  Allergen Reactions  . Ace Inhibitors Cough     Current Outpatient Medications:  .  albuterol (VENTOLIN HFA) 108 (90 Base) MCG/ACT inhaler, SMARTSIG:2 Puff(s) By Mouth 4 Times Daily PRN, Disp: , Rfl:  .  amLODipine (NORVASC) 5 MG tablet, TAKE 1 TABLET BY MOUTH EVERY DAY, Disp: 30 tablet, Rfl: 4 .  aspirin EC 81 MG EC tablet, Take 1 tablet (81 mg total) by mouth daily., Disp: , Rfl:  .  bictegravir-emtricitabine-tenofovir AF (BIKTARVY) 50-200-25 MG TABS tablet, Take 1 tablet by mouth daily., Disp: 30 tablet, Rfl: 11 .  blood glucose meter kit and supplies KIT, Dispense based on patient and insurance preference. Use up to four times daily as directed. (FOR ICD-9 250.00, 250.01)., Disp: 1 each, Rfl: 0 .  cetirizine (ZYRTEC) 10 MG tablet, Take 10 mg by mouth daily., Disp: , Rfl:  .  fluticasone (FLONASE) 50 MCG/ACT nasal spray, , Disp: , Rfl:  .  Insulin Glargine (LANTUS) 100 UNIT/ML Solostar Pen, Inject 10 Units into the skin daily at 10 pm. (Patient taking differently: Inject 20 Units into the skin at bedtime.), Disp: 15 mL, Rfl: 0 .  metFORMIN (GLUCOPHAGE) 1000 MG tablet, Take 1,000 mg by mouth 2 (two) times daily., Disp: , Rfl:  .  Vitamin D, Ergocalciferol, (DRISDOL) 1.25 MG (50000 UNIT) CAPS capsule, Take 50,000 Units by mouth once a week., Disp: , Rfl:  .  nitroGLYCERIN (NITROSTAT) 0.4 MG SL tablet, Place 1 tablet (0.4 mg total) under the tongue every 5 (five) minutes x 3 doses as needed for chest pain. (Patient not taking: Reported on 07/02/2020), Disp: 25 tablet, Rfl: 3 .  rosuvastatin (CRESTOR) 40 MG tablet, Take 1 tablet (40 mg total) by mouth daily. (Patient not taking: Reported on 07/02/2020), Disp: 90 tablet, Rfl: 3     Review of Systems  Constitutional: Negative for activity change, appetite change, chills, diaphoresis, fatigue, fever and unexpected weight change.  HENT: Negative for congestion, rhinorrhea, sinus pressure, sneezing,  sore throat and trouble swallowing.   Eyes: Negative for photophobia and visual disturbance.  Respiratory: Negative for cough, chest tightness, shortness of breath, wheezing and stridor.   Cardiovascular: Negative for chest pain, palpitations and leg swelling.  Gastrointestinal: Negative for abdominal distention, abdominal pain, anal bleeding, blood in stool, constipation, diarrhea, nausea and vomiting.  Genitourinary: Negative for difficulty urinating, dysuria, flank pain and hematuria.  Musculoskeletal: Negative for arthralgias, back pain, gait problem, joint swelling and myalgias.  Skin: Negative for color change, pallor and wound.  Neurological: Negative for dizziness, tremors, facial asymmetry, weakness and light-headedness.  Hematological: Negative for adenopathy. Does not bruise/bleed easily.  Psychiatric/Behavioral: Negative for agitation, behavioral problems, confusion, decreased concentration, dysphoric mood and sleep disturbance.       Objective:   Physical Exam Vitals and nursing note reviewed.  Constitutional:      Appearance: He is well-developed.  HENT:     Head: Normocephalic and atraumatic.  Eyes:     Conjunctiva/sclera: Conjunctivae normal.  Cardiovascular:     Rate and Rhythm: Normal rate and regular rhythm.  Pulmonary:     Effort: Pulmonary effort is normal. No respiratory distress.     Breath sounds: No wheezing.  Abdominal:     General: There is no distension.     Palpations: Abdomen is soft.  Musculoskeletal:        General: No tenderness. Normal range of motion.     Cervical back: Normal range of motion and neck supple.  Skin:    General: Skin is warm and dry.     Coloration: Skin is not pale.     Findings: No erythema.  Neurological:     General: No focal deficit present.     Mental Status: He is alert and oriented to person, place, and time.  Psychiatric:        Mood and Affect: Mood normal.        Behavior: Behavior normal.        Thought  Content: Thought content normal.        Judgment: Judgment normal.          Assessment & Plan:  HIV disease: Check labs today and if he is still suppressed continue on Biktarvy  Diabetes mellitus being followed by primary care, if he has problems with metformin toxicity in the context of him being on BIKTARVY and metformin will need to drop the metformin dose.  He is on insulin already as it is.  CAD: ontinue plavix, asa, beta blocker , accommodate his higher dose of Crestor we we have taken him off SYMTUZA  HTN: continue norvasc  And beta blocker  AVN: sp bilateral THA  Hyperlipidemia: onj crestor

## 2020-07-03 LAB — T-HELPER CELL (CD4) - (RCID CLINIC ONLY)
CD4 % Helper T Cell: 31 % — ABNORMAL LOW (ref 33–65)
CD4 T Cell Abs: 581 /uL (ref 400–1790)

## 2020-07-04 LAB — CBC WITH DIFFERENTIAL/PLATELET
Absolute Monocytes: 359 cells/uL (ref 200–950)
Basophils Absolute: 80 cells/uL (ref 0–200)
Basophils Relative: 1.4 %
Eosinophils Absolute: 331 cells/uL (ref 15–500)
Eosinophils Relative: 5.8 %
HCT: 29.1 % — ABNORMAL LOW (ref 38.5–50.0)
Hemoglobin: 8.8 g/dL — ABNORMAL LOW (ref 13.2–17.1)
Lymphs Abs: 1744 cells/uL (ref 850–3900)
MCH: 20.7 pg — ABNORMAL LOW (ref 27.0–33.0)
MCHC: 30.2 g/dL — ABNORMAL LOW (ref 32.0–36.0)
MCV: 68.3 fL — ABNORMAL LOW (ref 80.0–100.0)
MPV: 10.1 fL (ref 7.5–12.5)
Monocytes Relative: 6.3 %
Neutro Abs: 3186 cells/uL (ref 1500–7800)
Neutrophils Relative %: 55.9 %
Platelets: 290 10*3/uL (ref 140–400)
RBC: 4.26 10*6/uL (ref 4.20–5.80)
RDW: 17.7 % — ABNORMAL HIGH (ref 11.0–15.0)
Total Lymphocyte: 30.6 %
WBC: 5.7 10*3/uL (ref 3.8–10.8)

## 2020-07-04 LAB — COMPLETE METABOLIC PANEL WITH GFR
AG Ratio: 1.7 (calc) (ref 1.0–2.5)
ALT: 10 U/L (ref 9–46)
AST: 13 U/L (ref 10–35)
Albumin: 4.4 g/dL (ref 3.6–5.1)
Alkaline phosphatase (APISO): 69 U/L (ref 35–144)
BUN: 13 mg/dL (ref 7–25)
CO2: 27 mmol/L (ref 20–32)
Calcium: 9.6 mg/dL (ref 8.6–10.3)
Chloride: 106 mmol/L (ref 98–110)
Creat: 1.14 mg/dL (ref 0.70–1.25)
GFR, Est African American: 76 mL/min/{1.73_m2} (ref >=60)
GFR, Est Non African American: 65 mL/min/{1.73_m2} (ref >=60)
Globulin: 2.6 g/dL (calc) (ref 1.9–3.7)
Glucose, Bld: 135 mg/dL — ABNORMAL HIGH (ref 65–99)
Potassium: 3.9 mmol/L (ref 3.5–5.3)
Sodium: 142 mmol/L (ref 135–146)
Total Bilirubin: 0.2 mg/dL (ref 0.2–1.2)
Total Protein: 7 g/dL (ref 6.1–8.1)

## 2020-07-04 LAB — HIV-1 RNA QUANT-NO REFLEX-BLD
HIV 1 RNA Quant: <20 Copies/mL
HIV-1 RNA Quant, Log: <1.3 Log cps/mL

## 2020-07-04 LAB — CBC MORPHOLOGY

## 2020-07-07 ENCOUNTER — Other Ambulatory Visit: Payer: Self-pay | Admitting: Internal Medicine

## 2020-07-09 LAB — CBC
HCT: 29.1 % — ABNORMAL LOW (ref 38.5–50.0)
Hemoglobin: 8.7 g/dL — ABNORMAL LOW (ref 13.2–17.1)
MCH: 20.4 pg — ABNORMAL LOW (ref 27.0–33.0)
MCHC: 29.9 g/dL — ABNORMAL LOW (ref 32.0–36.0)
MCV: 68.3 fL — ABNORMAL LOW (ref 80.0–100.0)
MPV: 10.1 fL (ref 7.5–12.5)
Platelets: 310 10*3/uL (ref 140–400)
RBC: 4.26 10*6/uL (ref 4.20–5.80)
RDW: 17.7 % — ABNORMAL HIGH (ref 11.0–15.0)
WBC: 5.7 10*3/uL (ref 3.8–10.8)

## 2020-07-09 LAB — COMPLETE METABOLIC PANEL WITH GFR
AG Ratio: 1.5 (calc) (ref 1.0–2.5)
ALT: 13 U/L (ref 9–46)
AST: 16 U/L (ref 10–35)
Albumin: 4.2 g/dL (ref 3.6–5.1)
Alkaline phosphatase (APISO): 74 U/L (ref 35–144)
BUN: 11 mg/dL (ref 7–25)
CO2: 25 mmol/L (ref 20–32)
Calcium: 9.2 mg/dL (ref 8.6–10.3)
Chloride: 106 mmol/L (ref 98–110)
Creat: 1.05 mg/dL (ref 0.70–1.25)
GFR, Est African American: 84 mL/min/{1.73_m2} (ref >=60)
GFR, Est Non African American: 72 mL/min/{1.73_m2} (ref >=60)
Globulin: 2.8 g/dL (calc) (ref 1.9–3.7)
Glucose, Bld: 169 mg/dL — ABNORMAL HIGH (ref 65–99)
Potassium: 4 mmol/L (ref 3.5–5.3)
Sodium: 141 mmol/L (ref 135–146)
Total Bilirubin: 0.2 mg/dL (ref 0.2–1.2)
Total Protein: 7 g/dL (ref 6.1–8.1)

## 2020-07-09 LAB — LIPID PANEL
Cholesterol: 107 mg/dL (ref <200)
HDL: 39 mg/dL — ABNORMAL LOW (ref >=40)
LDL Cholesterol (Calc): 52 mg/dL (calc)
Non-HDL Cholesterol (Calc): 68 mg/dL (calc) (ref <130)
Total CHOL/HDL Ratio: 2.7 (calc) (ref <5.0)
Triglycerides: 78 mg/dL (ref <150)

## 2020-07-09 LAB — TSH: TSH: 0.66 mIU/L (ref 0.40–4.50)

## 2020-07-09 LAB — B12 AND FOLATE PANEL
Folate: 11.9 ng/mL
Vitamin B-12: 251 pg/mL (ref 200–1100)

## 2020-07-09 LAB — VITAMIN D 25 HYDROXY (VIT D DEFICIENCY, FRACTURES): Vit D, 25-Hydroxy: 76 ng/mL (ref 30–100)

## 2020-07-09 LAB — FERRITIN: Ferritin: 5 ng/mL — ABNORMAL LOW (ref 24–380)

## 2020-07-09 LAB — IRON, TOTAL/TOTAL IRON BINDING CAP
%SAT: 4 % (calc) — ABNORMAL LOW (ref 20–48)
Iron: 21 ug/dL — ABNORMAL LOW (ref 50–180)
TIBC: 515 mcg/dL (calc) — ABNORMAL HIGH (ref 250–425)

## 2020-07-09 LAB — PSA: PSA: 1.08 ng/mL (ref <=4.0)

## 2020-11-18 ENCOUNTER — Other Ambulatory Visit: Payer: Self-pay

## 2020-11-18 DIAGNOSIS — I10 Essential (primary) hypertension: Secondary | ICD-10-CM

## 2020-11-18 MED ORDER — AMLODIPINE BESYLATE 5 MG PO TABS: 5.0000 mg | ORAL_TABLET | Freq: Every day | ORAL | 1 refills | Status: AC

## 2020-12-07 ENCOUNTER — Telehealth: Payer: Self-pay | Admitting: Cardiovascular Disease

## 2020-12-07 NOTE — Telephone Encounter (Signed)
Pt states that Dr. Tresa Endo prescribed an rx without his consent that conflicted with his HIV medication causing an issue. Pt also states that he was told his cholestrol was high and had his meds increased due to this and doesnt actually have high cholestrol.. states that his primary care shouldve been consulted and as a result of these happenings would no longer like to be seen by Dr. Tresa Endo.

## 2020-12-10 ENCOUNTER — Telehealth: Payer: Self-pay

## 2020-12-10 ENCOUNTER — Other Ambulatory Visit: Payer: Self-pay

## 2020-12-10 ENCOUNTER — Other Ambulatory Visit: Payer: Medicare Other

## 2020-12-10 DIAGNOSIS — B20 Human immunodeficiency virus [HIV] disease: Secondary | ICD-10-CM

## 2020-12-10 NOTE — Telephone Encounter (Signed)
Patient here for labs, asking to talk to a nurse about Monkeypox. RN spoke with patient and discussed transmission, prevention and risk factors. Provided him with resource to [facility] website and asked him to please call with any further questions.   Sandie Ano, RN

## 2020-12-11 LAB — T-HELPER CELL (CD4) - (RCID CLINIC ONLY)
CD4 % Helper T Cell: 36 % (ref 33–65)
CD4 T Cell Abs: 700 /uL (ref 400–1790)

## 2020-12-14 LAB — COMPLETE METABOLIC PANEL WITH GFR
AG Ratio: 1.6 (calc) (ref 1.0–2.5)
ALT: 19 U/L (ref 9–46)
AST: 19 U/L (ref 10–35)
Albumin: 4.3 g/dL (ref 3.6–5.1)
Alkaline phosphatase (APISO): 69 U/L (ref 35–144)
BUN: 12 mg/dL (ref 7–25)
CO2: 27 mmol/L (ref 20–32)
Calcium: 9.6 mg/dL (ref 8.6–10.3)
Chloride: 107 mmol/L (ref 98–110)
Creat: 1.17 mg/dL (ref 0.70–1.35)
Globulin: 2.7 g/dL (calc) (ref 1.9–3.7)
Glucose, Bld: 235 mg/dL — ABNORMAL HIGH (ref 65–99)
Potassium: 3.7 mmol/L (ref 3.5–5.3)
Sodium: 143 mmol/L (ref 135–146)
Total Bilirubin: 0.3 mg/dL (ref 0.2–1.2)
Total Protein: 7 g/dL (ref 6.1–8.1)
eGFR: 67 mL/min/{1.73_m2} (ref >=60)

## 2020-12-14 LAB — CBC WITH DIFFERENTIAL/PLATELET
Absolute Monocytes: 410 cells/uL (ref 200–950)
Basophils Absolute: 58 cells/uL (ref 0–200)
Basophils Relative: 0.9 %
Eosinophils Absolute: 301 cells/uL (ref 15–500)
Eosinophils Relative: 4.7 %
HCT: 28.8 % — ABNORMAL LOW (ref 38.5–50.0)
Hemoglobin: 8.2 g/dL — ABNORMAL LOW (ref 13.2–17.1)
Lymphs Abs: 1901 cells/uL (ref 850–3900)
MCH: 19.7 pg — ABNORMAL LOW (ref 27.0–33.0)
MCHC: 28.5 g/dL — ABNORMAL LOW (ref 32.0–36.0)
MCV: 69.1 fL — ABNORMAL LOW (ref 80.0–100.0)
MPV: 10.1 fL (ref 7.5–12.5)
Monocytes Relative: 6.4 %
Neutro Abs: 3731 cells/uL (ref 1500–7800)
Neutrophils Relative %: 58.3 %
Platelets: 276 10*3/uL (ref 140–400)
RBC: 4.17 10*6/uL — ABNORMAL LOW (ref 4.20–5.80)
RDW: 18.1 % — ABNORMAL HIGH (ref 11.0–15.0)
Total Lymphocyte: 29.7 %
WBC: 6.4 10*3/uL (ref 3.8–10.8)

## 2020-12-14 LAB — RPR TITER: RPR Titer: 1:1 {titer} — ABNORMAL HIGH

## 2020-12-14 LAB — LIPID PANEL
Cholesterol: 98 mg/dL (ref <200)
HDL: 38 mg/dL — ABNORMAL LOW (ref >=40)
LDL Cholesterol (Calc): 38 mg/dL (calc)
Non-HDL Cholesterol (Calc): 60 mg/dL (calc) (ref <130)
Total CHOL/HDL Ratio: 2.6 (calc) (ref <5.0)
Triglycerides: 135 mg/dL (ref <150)

## 2020-12-14 LAB — CBC MORPHOLOGY

## 2020-12-14 LAB — RPR: RPR Ser Ql: REACTIVE — AB

## 2020-12-14 LAB — FLUORESCENT TREPONEMAL AB(FTA)-IGG-BLD: Fluorescent Treponemal ABS: REACTIVE — AB

## 2020-12-14 LAB — HIV-1 RNA QUANT-NO REFLEX-BLD
HIV 1 RNA Quant: NOT DETECTED Copies/mL
HIV-1 RNA Quant, Log: NOT DETECTED Log cps/mL

## 2020-12-22 ENCOUNTER — Ambulatory Visit: Payer: Medicare Other | Admitting: Podiatry

## 2020-12-22 ENCOUNTER — Encounter: Payer: Self-pay | Admitting: Podiatry

## 2020-12-22 ENCOUNTER — Other Ambulatory Visit: Payer: Self-pay

## 2020-12-22 VITALS — BP 149/80 | HR 70 | Temp 97.8°F

## 2020-12-22 DIAGNOSIS — E1065 Type 1 diabetes mellitus with hyperglycemia: Secondary | ICD-10-CM

## 2020-12-22 DIAGNOSIS — M79674 Pain in right toe(s): Secondary | ICD-10-CM

## 2020-12-22 DIAGNOSIS — M79675 Pain in left toe(s): Secondary | ICD-10-CM | POA: Diagnosis not present

## 2020-12-22 DIAGNOSIS — B351 Tinea unguium: Secondary | ICD-10-CM

## 2020-12-22 DIAGNOSIS — L853 Xerosis cutis: Secondary | ICD-10-CM

## 2020-12-22 DIAGNOSIS — E119 Type 2 diabetes mellitus without complications: Secondary | ICD-10-CM

## 2020-12-22 DIAGNOSIS — L84 Corns and callosities: Secondary | ICD-10-CM | POA: Diagnosis not present

## 2020-12-22 NOTE — Patient Instructions (Signed)
Diabetes Mellitus and Foot Care Foot care is an important part of your health, especially when you have diabetes. Diabetes may cause you to have problems because of poor blood flow (circulation) to your feet and legs, which can cause your skin to: Become thinner and drier. Break more easily. Heal more slowly. Peel and crack. You may also have nerve damage (neuropathy) in your legs and feet, causing decreased feeling in them. This means that you may not notice minor injuries to your feet that could lead to more serious problems. Noticing and addressing any potential problems early is the best wayto prevent future foot problems. How to care for your feet Foot hygiene  Wash your feet daily with warm water and mild soap. Do not use hot water. Then, pat your feet and the areas between your toes until they are completely dry. Do not soak your feet as this can dry your skin. Trim your toenails straight across. Do not dig under them or around the cuticle. File the edges of your nails with an emery board or nail file. Apply a moisturizing lotion or petroleum jelly to the skin on your feet and to dry, brittle toenails. Use lotion that does not contain alcohol and is unscented. Do not apply lotion between your toes.  Shoes and socks Wear clean socks or stockings every day. Make sure they are not too tight. Do not wear knee-high stockings since they may decrease blood flow to your legs. Wear shoes that fit properly and have enough cushioning. Always look in your shoes before you put them on to be sure there are no objects inside. To break in new shoes, wear them for just a few hours a day. This prevents injuries on your feet. Wounds, scrapes, corns, and calluses  Check your feet daily for blisters, cuts, bruises, sores, and redness. If you cannot see the bottom of your feet, use a mirror or ask someone for help. Do not cut corns or calluses or try to remove them with medicine. If you find a minor scrape,  cut, or break in the skin on your feet, keep it and the skin around it clean and dry. You may clean these areas with mild soap and water. Do not clean the area with peroxide, alcohol, or iodine. If you have a wound, scrape, corn, or callus on your foot, look at it several times a day to make sure it is healing and not infected. Check for: Redness, swelling, or pain. Fluid or blood. Warmth. Pus or a bad smell.  General tips Do not cross your legs. This may decrease blood flow to your feet. Do not use heating pads or hot water bottles on your feet. They may burn your skin. If you have lost feeling in your feet or legs, you may not know this is happening until it is too late. Protect your feet from hot and cold by wearing shoes, such as at the beach or on hot pavement. Schedule a complete foot exam at least once a year (annually) or more often if you have foot problems. Report any cuts, sores, or bruises to your health care provider immediately. Where to find more information American Diabetes Association: www.diabetes.org Association of Diabetes Care & Education Specialists: www.diabeteseducator.org Contact a health care provider if: You have a medical condition that increases your risk of infection and you have any cuts, sores, or bruises on your feet. You have an injury that is not healing. You have redness on your legs or feet.  You feel burning or tingling in your legs or feet. You have pain or cramps in your legs and feet. Your legs or feet are numb. Your feet always feel cold. You have pain around any toenails. Get help right away if: You have a wound, scrape, corn, or callus on your foot and: You have pain, swelling, or redness that gets worse. You have fluid or blood coming from the wound, scrape, corn, or callus. Your wound, scrape, corn, or callus feels warm to the touch. You have pus or a bad smell coming from the wound, scrape, corn, or callus. You have a fever. You have a red  line going up your leg. Summary Check your feet every day for blisters, cuts, bruises, sores, and redness. Apply a moisturizing lotion or petroleum jelly to the skin on your feet and to dry, brittle toenails. Wear shoes that fit properly and have enough cushioning. If you have foot problems, report any cuts, sores, or bruises to your health care provider immediately. Schedule a complete foot exam at least once a year (annually) or more often if you have foot problems. This information is not intended to replace advice given to you by your health care provider. Make sure you discuss any questions you have with your healthcare provider. Document Revised: 11/14/2019 Document Reviewed: 11/14/2019 Elsevier Patient Education  2022 [employer]. Onychomycosis/Fungal Toenails  WHAT IS IT? An infection that lies within the keratin of your nail plate that is caused by a fungus.  WHY ME? Fungal infections affect all ages, sexes, races, and creeds.  There may be many factors that predispose you to a fungal infection such as age, coexisting medical conditions such as diabetes, or an autoimmune disease; stress, medications, fatigue, genetics, etc.  Bottom line: fungus thrives in a warm, moist environment and your shoes offer such a location.  IS IT CONTAGIOUS? Theoretically, yes.  You do not want to share shoes, nail clippers or files with someone who has fungal toenails.  Walking around barefoot in the same room or sleeping in the same bed is unlikely to transfer the organism.  It is important to realize, however, that fungus can spread easily from one nail to the next on the same foot.  HOW DO WE TREAT THIS?  There are several ways to treat this condition.  Treatment may depend on many factors such as age, medications, pregnancy, liver and kidney conditions, etc.  It is best to ask your doctor which options are available to you.  No treatment.   Unlike many other medical concerns, you can live with this  condition.  However for many people this can be a painful condition and may lead to ingrown toenails or a bacterial infection.  It is recommended that you keep the nails cut short to help reduce the amount of fungal nail. Topical treatment.  These range from herbal remedies to prescription strength nail lacquers.  About 40-50% effective, topicals require twice daily application for approximately 9 to 12 months or until an entirely new nail has grown out.  The most effective topicals are medical grade medications available through physicians offices. Oral antifungal medications.  With an 80-90% cure rate, the most common oral medication requires 3 to 4 months of therapy and stays in your system for a year as the new nail grows out.  Oral antifungal medications do require blood work to make sure it is a safe drug for you.  A liver function panel will be performed prior to starting the  medication and after the first month of treatment.  It is important to have the blood work performed to avoid any harmful side effects.  In general, this medication safe but blood work is required. Laser Therapy.  This treatment is performed by applying a specialized laser to the affected nail plate.  This therapy is noninvasive, fast, and non-painful.  It is not covered by insurance and is therefore, out of pocket.  The results have been very good with a 80-95% cure rate.  [facility] is the only practice in the area to offer this therapy. Permanent Nail Avulsion.  Removing the entire nail so that a new nail will not grow back.  For normal skin: Moisturize feet once daily; do not apply between toes A.  CeraVe Daily Moisturizing Lotion B.  Vaseline Intensive Care Lotion C.  Lubriderm Lotion D.  Gold Bond Diabetic Foot Lotion E.  Eucerin Intensive Repair Moisturizing Lotion  For extremely dry, cracked feet: moisturize feet once daily; do not apply between toes A. CeraVe Healing Ointment B. Aquaphor Healing  Ointment C. Vaseline Petroleum Healing Jelly   If you have problems reaching your feet: apply to feet once daily; do not apply between toes A.  Aquaphor Advanced Therapy Ointment Body Spray B.  Vaseline Intensive Care Spray Lotion Advanced Repair

## 2020-12-26 ENCOUNTER — Encounter: Payer: Self-pay | Admitting: Podiatry

## 2020-12-26 NOTE — Progress Notes (Signed)
Subjective: Wayne Santos presents today referred by Wayne Ebbs, MD for diabetic foot evaluation.  Patient relates one year history of diabetes.  Patient denies any history of foot wounds.  Patient denies any symptoms of numbness, tingling or burning in feet.  Patient denies any symptoms of pins/needles sensations in feet.  Patient relates blood glucose was 128 mg/dl this morning.   PCP is Wayne Ebbs, MD , and last visit was 3 weeks ago..  Today, patient c/o of painful, discolored, thick toenails which interfere with daily activities.  Pain is aggravated when wearing enclosed shoe gear.   Past Medical History:  Diagnosis Date   Anemia    AVN (avascular necrosis of bone) (HCC)    Benign paroxysmal positional vertigo 06/15/2015   CAD (coronary artery disease), native coronary artery    a. 08/24/16 NSTEMI/PCI: LM nl, LAD 40p, D1 60ost, D2 80ost-small, LCX 52m(2.5x30 ONyx DES), OM1/2 min irregs, OM3 nl, RCA 30p/m, RPDA/RPAV/RPL1/RPL2/RPL3 nl, EF 55-65%.   Childhood asthma    Dizziness 05/20/2020   GERD (gastroesophageal reflux disease)    Glaucoma    History of pneumonia    "4 times in my life" (08/24/2016)   Hyperlipidemia    Hypertension    Memory impairment 06/15/2015   Normochromic normocytic anemia 12/16/2019   Osteoporosis    Poison ivy 12/26/2018   Primary localized osteoarthrosis of left hip 07/05/2016   Seasonal allergies    Tobacco abuse     Patient Active Problem List   Diagnosis Date Noted   Dizziness 05/20/2020   Normochromic normocytic anemia 12/16/2019   Poison ivy 12/26/2018   CAD (coronary artery disease), native coronary artery 01/11/2017   NSTEMI (non-ST elevated myocardial infarction) ([place] 08/24/2016   Primary localized osteoarthrosis of left hip 07/05/2016   Primary osteoarthritis of left hip 07/05/2016   Encounter for long-term (current) use of medications 05/18/2016   Routine screening for STI (sexually transmitted infection) 05/18/2016    Benign paroxysmal positional vertigo 06/15/2015   Memory impairment 06/15/2015   Benign essential HTN 06/02/2014   Type 1 diabetes mellitus with hyperglycemia ([place] 02/04/2013   AV[place] of bone) [place]    Glaucoma    Hyperlipidemia 03/16/2011   ONYCHOMYCOSIS, TOENAILS 03/24/2010   MEMORY LOSS 12/21/2009   POSTOPERATIVE INFECTION 07/10/2008   Sinusitis, chronic 12/13/2007   URINARY TRACT INFECTION SITE NOT SPECIFIED 04/04/2007   BOILS, RECURRENT 11/14/2006   HIV disease ([facility] 09/12/2006   PANIC ATTACK 09/12/2006   Essential hypertension 09/12/2006   Aseptic necrosis of bone ([facility] 09/12/2006    Past Surgical History:  Procedure Laterality Date   CARDIAC CATHETERIZATION     CORONARY ANGIOPLASTY WITH STENT PLACEMENT  08/24/2016   CORONARY STENT INTERVENTION N/A 08/24/2016   Procedure: Coronary Stent Intervention;  Surgeon: MWellington Hampshire MD;  [facility];  Service: Cardiovascular;  Laterality: N/A;   HAND NERVE REPAIR Right 1990s?   "caught it in a machine; had to be reconstructed"   JOINT REPLACEMENT     LEFT HEART CATH AND CORONARY ANGIOGRAPHY N/A 08/24/2016   Procedure: Left Heart Cath and Coronary Angiography;  Surgeon: MWellington Hampshire MD;  [facility];  Service: Cardiovascular;  Laterality: N/A;   TOTAL HIP ARTHROPLASTY Right    TOTAL HIP ARTHROPLASTY Left 07/05/2016   Procedure: TOTAL HIP ARTHROPLASTY ANTERIOR APPROACH;  Surgeon: PMelrose Nakayama MD;  [facility]  Service: Orthopedics;  Laterality: Left;    Current Outpatient Medications on File Prior to  Visit  Medication Sig Dispense Refill   ACCU-CHEK GUIDE test strip FOR THREE TIMES DAILY GLUCOSE TESTING     albuterol (VENTOLIN HFA) 108 (90 Base) MCG/ACT inhaler SMARTSIG:2 Puff(s) By Mouth 4 Times Daily PRN     amLODipine (NORVASC) 5 MG tablet Take 1 tablet (5 mg total) by mouth daily. 30 tablet 1   aspirin EC 81 MG EC tablet Take 1 tablet (81 mg total) by mouth  daily.     B-D UF III MINI PEN NEEDLES 31G X 5 MM MISC Inject into the skin as directed.     bictegravir-emtricitabine-tenofovir AF (BIKTARVY) 50-200-25 MG TABS tablet Take 1 tablet by mouth daily. 30 tablet 11   blood glucose meter kit and supplies KIT Dispense based on patient and insurance preference. Use up to four times daily as directed. (FOR ICD-9 250.00, 250.01). 1 each 0   cetirizine (ZYRTEC) 10 MG tablet Take 10 mg by mouth daily.     fluticasone (FLONASE) 50 MCG/ACT nasal spray      Insulin Glargine (LANTUS) 100 UNIT/ML Solostar Pen Inject 10 Units into the skin daily at 10 pm. (Patient taking differently: Inject 20 Units into the skin at bedtime.) 15 mL 0   metFORMIN (GLUCOPHAGE) 1000 MG tablet Take 1,000 mg by mouth 2 (two) times daily.     nitroGLYCERIN (NITROSTAT) 0.4 MG SL tablet Place 1 tablet (0.4 mg total) under the tongue every 5 (five) minutes x 3 doses as needed for chest pain. (Patient not taking: Reported on 07/02/2020) 25 tablet 3   rosuvastatin (CRESTOR) 40 MG tablet Take 1 tablet (40 mg total) by mouth daily. (Patient not taking: Reported on 07/02/2020) 90 tablet 3   Vitamin D, Ergocalciferol, (DRISDOL) 1.25 MG (50000 UNIT) CAPS capsule Take 50,000 Units by mouth once a week.     No current facility-administered medications on file prior to visit.     Allergies  Allergen Reactions   Ace Inhibitors Cough    Social History   Occupational History   Not on file  Tobacco Use   Smoking status: Former    Packs/day: 0.50    Years: 47.00    Pack years: 23.50    Types: Cigarettes    Quit date: 09/06/2016    Years since quitting: 4.3   Smokeless tobacco: Never  Substance and Sexual Activity   Alcohol use: Yes    Alcohol/week: 0.0 standard drinks    Comment: 08/24/2016 "I'll have a couple drinks maybe 4 times/year"   Drug use: Not Currently    Types: Marijuana    Comment: 08/24/2016 "nothing since my younger days"   Sexual activity: Not Currently    Comment: declined  condoms.     No family history on file.  Immunization History  Administered Date(s) Administered   Hepatitis A 07/10/2008, 12/21/2009   Hepatitis B 07/10/2008, 05/26/2009, 03/24/2010   Influenza Split 03/16/2011, 02/02/2012   Influenza Whole 04/11/2006, 04/04/2007, 05/26/2009, 03/12/2010   Influenza, High Dose Seasonal PF 01/26/2019   Influenza,inj,Quad PF,6+ Mos 02/04/2013, 01/01/2014, 02/09/2015, 05/18/2016, 06/05/2017   Influenza-Unspecified 05/14/2018, 02/07/2020   Pneumococcal Conjugate-13 08/31/2017   Pneumococcal Polysaccharide-23 04/02/2001, 11/06/2006   Tdap 06/07/2018    Objective: Vitals:   12/22/20 0952  BP: (!) 149/80  Pulse: 70  Temp: 97.8 F (36.6 C)  SpO2: 98%    Wayne Santos is a pleasant 70 y.o. male WD, WN in NAD. AAO X 3.  Vascular Examination: Capillary fill time to digits <3 seconds b/l lower extremities. Palpable DP  pulse(s) b/l lower extremities Faintly palpable PT pulse(s) b/l lower extremities. Pedal hair absent. Lower extremity skin temperature gradient within normal limits. No pain with calf compression b/l. No edema noted b/l lower extremities.  Dermatological Examination: Pedal skin with normal turgor, texture and tone b/l lower extremities. No open wounds b/l lower extremities. No interdigital macerations b/l lower extremities. Toenails L hallux, L 5th toe, R hallux, and R 5th toe elongated, discolored, dystrophic, thickened, and crumbly with subungual debris and tenderness to dorsal palpation. Hyperkeratotic lesion(s) L hallux and R hallux.  No erythema, no edema, no drainage, no fluctuance. Pedal skin noted to be dry and flaky b/l lower extremities.  Musculoskeletal Examination: Normal muscle strength 5/5 to all lower extremity muscle groups bilaterally. No pain crepitus or joint limitation noted with ROM b/l lower extremities. No gross bony deformities b/l lower extremities. Patient ambulates independent of any assistive aids.  Footwear  Assessment: Does the patient wear appropriate shoes? Yes. Does the patient need inserts/orthotics? No.  Neurological Examination: Protective sensation intact 5/5 intact bilaterally with 10g monofilament b/l. Vibratory sensation intact b/l.  Assessment: 1. Pain due to onychomycosis of toenails of both feet   2. Callus   3. Xerosis cutis   4. Type 1 diabetes mellitus with hyperglycemia (HCC)   5. Encounter for diabetic foot exam ([place])      ADA Risk Categorization: Low Risk:  Patient has all of the following: Intact protective sensation No prior foot ulcer  No severe deformity Pedal pulses present  Plan: -Examined patient. -Discussed diabetic foot care principles. Literature dispensed on today. -Continue diabetic foot care principles: inspect feet daily, monitor glucose as recommended by PCP and/or Endocrinologist, and follow prescribed diet per PCP, Endocrinologist and/or dietician. -Patient to continue soft, supportive shoe gear daily. -Toenails L hallux, L 5th toe, R hallux, and R 5th toe debrided in length and girth without iatrogenic bleeding with sterile nail nipper and dremel.  -Nondystrophic toenails trimmed L 2nd toe, L 3rd toe, L 4th toe, R 2nd toe, R 3rd toe, and R 4th toe. -Callus(es) L hallux and R hallux pared utilizing sterile scalpel blade without complication or incident. Total number debrided =2. -Patient to report any pedal injuries to medical professional immediately. -For dry skin, patient was given written list of OTC moisturizers. Patient/POA instructed to apply to foot/feet once daily avoiding application between toes.  -Patient/POA to call should there be question/concern in the interim.  Return in about 3 months (around 03/24/2021).  Marzetta Board, DPM

## 2020-12-28 ENCOUNTER — Other Ambulatory Visit: Payer: Self-pay

## 2020-12-28 ENCOUNTER — Encounter: Payer: Self-pay | Admitting: Infectious Disease

## 2020-12-28 ENCOUNTER — Ambulatory Visit: Payer: Medicare Other | Admitting: Infectious Disease

## 2020-12-28 VITALS — BP 147/68 | HR 91 | Temp 98.1°F | Wt 184.0 lb

## 2020-12-28 DIAGNOSIS — Z87891 Personal history of nicotine dependence: Secondary | ICD-10-CM | POA: Insufficient documentation

## 2020-12-28 DIAGNOSIS — Z7185 Encounter for immunization safety counseling: Secondary | ICD-10-CM

## 2020-12-28 DIAGNOSIS — I2510 Atherosclerotic heart disease of native coronary artery without angina pectoris: Secondary | ICD-10-CM

## 2020-12-28 DIAGNOSIS — Z79899 Other long term (current) drug therapy: Secondary | ICD-10-CM | POA: Diagnosis not present

## 2020-12-28 DIAGNOSIS — Z113 Encounter for screening for infections with a predominantly sexual mode of transmission: Secondary | ICD-10-CM

## 2020-12-28 DIAGNOSIS — E7800 Pure hypercholesterolemia, unspecified: Secondary | ICD-10-CM

## 2020-12-28 DIAGNOSIS — I10 Essential (primary) hypertension: Secondary | ICD-10-CM | POA: Diagnosis not present

## 2020-12-28 DIAGNOSIS — E1065 Type 1 diabetes mellitus with hyperglycemia: Secondary | ICD-10-CM

## 2020-12-28 DIAGNOSIS — B20 Human immunodeficiency virus [HIV] disease: Secondary | ICD-10-CM | POA: Diagnosis not present

## 2020-12-28 HISTORY — DX: Personal history of nicotine dependence: Z87.891

## 2020-12-28 NOTE — Progress Notes (Signed)
Chief complaint follow-up for HIV disease on medications he is been unhappy about having been on a higher dose of statin than he should have been in the context of his [place] therapy he was previously on.  Subjective:    Patient ID: Wayne Santos, male    DOB: [birth year], 70 y.o.   MRN: [id number]  HPI  Wayne Santos is a 70 y.o. male who is doing superbly well on his  antiviral regimen, SYMTUZA  Wayne Santos was diagnosed in 1987 he tells me and had a CD4 nadir then and then once he went on an ARV holiday per Dr. Ruffin Frederick instructions.  He is otherwise maintain strict adherence to his antivirals and remained undetectable  Wayne Santos continued to do quite well on Symtuza with undetectable viral load.  He will renewed his SPAP program.  He has gotten COVID-19 vaccinated and boosted  Prior to my last visit he had been seen by cardiology who increased his Crestor dose to 40 mg note this was higher than the recommended dose with the booster of cobicistat that is in his [place] which will cause the levels to be much higher than at 56m due to inhibition of cytochrome P4 350 enzymes.  I therefore switched him off of SElkoand onto Biktarvy to allow him to be on this high dose of Crestor.  After switch from SCombeshas maintained virological suppression    Past Medical History:  Diagnosis Date   Anemia    AVN (avascular necrosis of bone) (HCC)    Benign paroxysmal positional vertigo 06/15/2015   CAD (coronary artery disease), native coronary artery    a. 08/24/16 NSTEMI/PCI: LM nl, LAD 40p, D1 60ost, D2 80ost-small, LCX 945m2.5x30 ONyx DES), OM1/2 min irregs, OM3 nl, RCA 30p/m, RPDA/RPAV/RPL1/RPL2/RPL3 nl, EF 55-65%.   Childhood asthma    Dizziness 05/20/2020   GERD (gastroesophageal reflux disease)    Glaucoma    History of pneumonia    "4 times in my life" (08/24/2016)   Hyperlipidemia    Hypertension    Memory impairment 06/15/2015   Normochromic normocytic anemia 12/16/2019    Osteoporosis    Poison ivy 12/26/2018   Primary localized osteoarthrosis of left hip 07/05/2016   Seasonal allergies    Tobacco abuse     Past Surgical History:  Procedure Laterality Date   CARDIAC CATHETERIZATION     CORONARY ANGIOPLASTY WITH STENT PLACEMENT  08/24/2016   CORONARY STENT INTERVENTION N/A 08/24/2016   Procedure: Coronary Stent Intervention;  Surgeon: MuWellington HampshireMD;  [facility];  Service: Cardiovascular;  Laterality: N/A;   HAND NERVE REPAIR Right 1990s?   "caught it in a machine; had to be reconstructed"   JOINT REPLACEMENT     LEFT HEART CATH AND CORONARY ANGIOGRAPHY N/A 08/24/2016   Procedure: Left Heart Cath and Coronary Angiography;  Surgeon: MuWellington HampshireMD;  [facility];  Service: Cardiovascular;  Laterality: N/A;   TOTAL HIP ARTHROPLASTY Right    TOTAL HIP ARTHROPLASTY Left 07/05/2016   Procedure: TOTAL HIP ARTHROPLASTY ANTERIOR APPROACH;  Surgeon: PeMelrose NakayamaMD;  [facility] Service: [facility]ics;  Laterality: Left;    No family history on file.    Social History   Socioeconomic History   Marital status: Single    Spouse name: Not on file   Number of children: Not on file   Years of education: Not on file   Highest education level: Not on file  Occupational  History   Not on file  Tobacco Use   Smoking status: Former    Packs/day: 0.50    Years: 47.00    Pack years: 23.50    Types: Cigarettes    Quit date: 09/06/2016    Years since quitting: 4.3   Smokeless tobacco: Never  Substance and Sexual Activity   Alcohol use: Yes    Alcohol/week: 0.0 standard drinks    Comment: 08/24/2016 "I'll have a couple drinks maybe 4 times/year"   Drug use: Not Currently    Types: Marijuana    Comment: 08/24/2016 "nothing since my younger days"   Sexual activity: Not Currently    Comment: declined condoms.   Other Topics Concern   Not on file  Social History Narrative   Not on file   Social Determinants of  Health   Financial Resource Strain: Not on file  Food Insecurity: Not on file  Transportation Needs: Not on file  Physical Activity: Not on file  Stress: Not on file  Social Connections: Not on file    Allergies  Allergen Reactions   Ace Inhibitors Cough     Current Outpatient Medications:    ACCU-CHEK GUIDE test strip, FOR THREE TIMES DAILY GLUCOSE TESTING, Disp: , Rfl:    albuterol (VENTOLIN HFA) 108 (90 Base) MCG/ACT inhaler, SMARTSIG:2 Puff(s) By Mouth 4 Times Daily PRN, Disp: , Rfl:    amLODipine (NORVASC) 5 MG tablet, Take 1 tablet (5 mg total) by mouth daily., Disp: 30 tablet, Rfl: 1   aspirin EC 81 MG EC tablet, Take 1 tablet (81 mg total) by mouth daily., Disp: , Rfl:    B-D UF III MINI PEN NEEDLES 31G X 5 MM MISC, Inject into the skin as directed., Disp: , Rfl:    bictegravir-emtricitabine-tenofovir AF (BIKTARVY) 50-200-25 MG TABS tablet, Take 1 tablet by mouth daily., Disp: 30 tablet, Rfl: 11   blood glucose meter kit and supplies KIT, Dispense based on patient and insurance preference. Use up to four times daily as directed. (FOR ICD-9 250.00, 250.01)., Disp: 1 each, Rfl: 0   cetirizine (ZYRTEC) 10 MG tablet, Take 10 mg by mouth daily., Disp: , Rfl:    fluticasone (FLONASE) 50 MCG/ACT nasal spray, , Disp: , Rfl:    Insulin Glargine (LANTUS) 100 UNIT/ML Solostar Pen, Inject 10 Units into the skin daily at 10 pm. (Patient taking differently: Inject 20 Units into the skin at bedtime.), Disp: 15 mL, Rfl: 0   metFORMIN (GLUCOPHAGE) 1000 MG tablet, Take 1,000 mg by mouth 2 (two) times daily., Disp: , Rfl:    nitroGLYCERIN (NITROSTAT) 0.4 MG SL tablet, Place 1 tablet (0.4 mg total) under the tongue every 5 (five) minutes x 3 doses as needed for chest pain., Disp: 25 tablet, Rfl: 3   rosuvastatin (CRESTOR) 40 MG tablet, Take 1 tablet (40 mg total) by mouth daily., Disp: 90 tablet, Rfl: 3   Vitamin D, Ergocalciferol, (DRISDOL) 1.25 MG (50000 UNIT) CAPS capsule, Take 50,000 Units by  mouth once a week., Disp: , Rfl:      Review of Systems  Constitutional:  Negative for activity change, appetite change, chills, diaphoresis, fatigue, fever and unexpected weight change.  HENT:  Negative for congestion, rhinorrhea, sinus pressure, sneezing, sore throat and trouble swallowing.   Eyes:  Negative for photophobia and visual disturbance.  Respiratory:  Negative for cough, chest tightness, shortness of breath, wheezing and stridor.   Cardiovascular:  Negative for chest pain, palpitations and leg swelling.  Gastrointestinal:  Negative for  abdominal distention, abdominal pain, anal bleeding, blood in stool, constipation, diarrhea, nausea and vomiting.  Genitourinary:  Negative for difficulty urinating, dysuria, flank pain and hematuria.  Musculoskeletal:  Negative for arthralgias, back pain, gait problem, joint swelling and myalgias.  Skin:  Negative for color change, pallor, rash and wound.  Neurological:  Negative for dizziness, tremors, weakness and light-headedness.  Hematological:  Negative for adenopathy. Does not bruise/bleed easily.  Psychiatric/Behavioral:  Negative for agitation, behavioral problems, confusion, decreased concentration, dysphoric mood and sleep disturbance.       Objective:   Physical Exam Constitutional:      Appearance: Normal appearance. He is well-developed.  HENT:     Head: Normocephalic and atraumatic.  Eyes:     General: No scleral icterus.       Right eye: No discharge.        Left eye: No discharge.     Conjunctiva/sclera: Conjunctivae normal.  Cardiovascular:     Rate and Rhythm: Normal rate and regular rhythm.  Pulmonary:     Effort: Pulmonary effort is normal. No respiratory distress.     Breath sounds: No wheezing.  Abdominal:     General: There is no distension.     Palpations: Abdomen is soft.  Musculoskeletal:        General: No tenderness. Normal range of motion.     Cervical back: Normal range of motion and neck supple.   Skin:    General: Skin is warm and dry.     Coloration: Skin is not pale.     Findings: No erythema or rash.  Neurological:     General: No focal deficit present.     Mental Status: He is alert and oriented to person, place, and time.  Psychiatric:        Mood and Affect: Mood normal.        Behavior: Behavior normal.        Thought Content: Thought content normal.        Judgment: Judgment normal.         Assessment & Plan:  HIV disease:  I am continuing his BIKTARVY  He is renewing his SPAP program  I have reviewed his labs with him including his HIV viral load on 10 December 2020 which was not detected and CD4 count on the same day 700  Diabetes mellitus: He is being followed by Dr. Amil Amen now and on insulin and metformin as well as Crestor  Coronary artery disease he will continue on his Plavix aspirin beta-blocker and Crestor at 40 mg  HTN: BP needs to be better than measurement today. Will defer to PCP consideratio increase or add medications.  He is continuing now on amlodipine and I believe metoprolol though it is not on his profile here  Vitals:   12/28/20 1611  BP: (!) 147/68  Pulse: 91  Temp: 98.1 F (36.7 C)  SpO2: 99%     Avascular necrosis of the hip status post total hip arthroplasty  Hyperlipidemia continue with Crestor 40 mg, I will recheck his lipid panel with next labs   Former smoker: he has been off of cigarettes x 3 years   Vaccine counseling: He inquired about protection against monkey box and we will give him a first dose of vaccine tomorrow followed by another 128 days later

## 2020-12-30 ENCOUNTER — Other Ambulatory Visit: Payer: Self-pay

## 2020-12-30 ENCOUNTER — Ambulatory Visit (INDEPENDENT_AMBULATORY_CARE_PROVIDER_SITE_OTHER): Payer: Medicare Other

## 2020-12-30 DIAGNOSIS — Z23 Encounter for immunization: Secondary | ICD-10-CM

## 2020-12-30 NOTE — Progress Notes (Signed)
    [facility]  Name:  Wayne Santos    MRN: [id number] DOB: November 05, 1950   12/30/2020  [name] was observed post JYNNEOS immunization for 15 minutes without incident. He was provided with Vaccine Information Sheet and instruction to access the V-Safe system.   [name] was instructed to call 911 with any severe reactions post vaccine: Difficulty breathing  Swelling of face and throat  A fast heartbeat  A bad rash all over body  Dizziness and weakness    Makeyla Govan T Pricilla Loveless

## 2021-01-27 ENCOUNTER — Ambulatory Visit (INDEPENDENT_AMBULATORY_CARE_PROVIDER_SITE_OTHER): Payer: Medicare Other

## 2021-01-27 ENCOUNTER — Other Ambulatory Visit: Payer: Self-pay

## 2021-01-27 DIAGNOSIS — Z23 Encounter for immunization: Secondary | ICD-10-CM

## 2021-01-27 NOTE — Progress Notes (Signed)
    [facility]  Name:  Wayne Santos    MRN: [id number] DOB: 1951-01-30   01/27/2021  [name] was observed post JYNNEOS immunization for 15 minutes without incident. He was provided with Vaccine Information Sheet and instruction to access the V-Safe system.   [name] was instructed to call 911 with any severe reactions post vaccine: Difficulty breathing  Swelling of face and throat  A fast heartbeat  A bad rash all over body  Dizziness and weakness    Rosanna Randy, RN

## 2021-03-10 ENCOUNTER — Emergency Department (HOSPITAL_COMMUNITY): Payer: Medicare Other

## 2021-03-10 ENCOUNTER — Emergency Department (HOSPITAL_COMMUNITY)
Admission: EM | Admit: 2021-03-10 | Discharge: 2021-03-10 | Disposition: A | Discharge status: Home / Self Care | Payer: Medicare Other | Attending: Emergency Medicine | Admitting: Emergency Medicine

## 2021-03-10 ENCOUNTER — Other Ambulatory Visit: Payer: Self-pay

## 2021-03-10 ENCOUNTER — Encounter (HOSPITAL_COMMUNITY): Payer: Self-pay

## 2021-03-10 ENCOUNTER — Ambulatory Visit (HOSPITAL_COMMUNITY)
Admission: EM | Admit: 2021-03-10 | Discharge: 2021-03-10 | Disposition: A | Discharge status: Home / Self Care | Payer: Medicare Other | Attending: Internal Medicine | Admitting: Internal Medicine

## 2021-03-10 DIAGNOSIS — Z955 Presence of coronary angioplasty implant and graft: Secondary | ICD-10-CM | POA: Insufficient documentation

## 2021-03-10 DIAGNOSIS — R079 Chest pain, unspecified: Secondary | ICD-10-CM

## 2021-03-10 DIAGNOSIS — J45909 Unspecified asthma, uncomplicated: Secondary | ICD-10-CM | POA: Insufficient documentation

## 2021-03-10 DIAGNOSIS — E876 Hypokalemia: Secondary | ICD-10-CM | POA: Insufficient documentation

## 2021-03-10 DIAGNOSIS — Z21 Asymptomatic human immunodeficiency virus [HIV] infection status: Secondary | ICD-10-CM | POA: Diagnosis not present

## 2021-03-10 DIAGNOSIS — Z96643 Presence of artificial hip joint, bilateral: Secondary | ICD-10-CM | POA: Diagnosis not present

## 2021-03-10 DIAGNOSIS — R0602 Shortness of breath: Secondary | ICD-10-CM | POA: Insufficient documentation

## 2021-03-10 DIAGNOSIS — Z87891 Personal history of nicotine dependence: Secondary | ICD-10-CM | POA: Diagnosis not present

## 2021-03-10 DIAGNOSIS — R0789 Other chest pain: Secondary | ICD-10-CM | POA: Insufficient documentation

## 2021-03-10 DIAGNOSIS — Z79899 Other long term (current) drug therapy: Secondary | ICD-10-CM | POA: Insufficient documentation

## 2021-03-10 DIAGNOSIS — Z7984 Long term (current) use of oral hypoglycemic drugs: Secondary | ICD-10-CM | POA: Insufficient documentation

## 2021-03-10 DIAGNOSIS — I2510 Atherosclerotic heart disease of native coronary artery without angina pectoris: Secondary | ICD-10-CM | POA: Insufficient documentation

## 2021-03-10 DIAGNOSIS — Z7982 Long term (current) use of aspirin: Secondary | ICD-10-CM | POA: Diagnosis not present

## 2021-03-10 DIAGNOSIS — I10 Essential (primary) hypertension: Secondary | ICD-10-CM | POA: Diagnosis not present

## 2021-03-10 DIAGNOSIS — E1069 Type 1 diabetes mellitus with other specified complication: Secondary | ICD-10-CM | POA: Insufficient documentation

## 2021-03-10 DIAGNOSIS — Z794 Long term (current) use of insulin: Secondary | ICD-10-CM | POA: Diagnosis not present

## 2021-03-10 DIAGNOSIS — Z751 Person awaiting admission to adequate facility elsewhere: Secondary | ICD-10-CM

## 2021-03-10 LAB — BASIC METABOLIC PANEL
Anion gap: 10 (ref 5–15)
BUN: 13 mg/dL (ref 8–23)
CO2: 26 mmol/L (ref 22–32)
Calcium: 9.4 mg/dL (ref 8.9–10.3)
Chloride: 105 mmol/L (ref 98–111)
Creatinine, Ser: 0.9 mg/dL (ref 0.61–1.24)
GFR, Estimated: >60 mL/min (ref >60)
Glucose, Bld: 124 mg/dL — ABNORMAL HIGH (ref 70–99)
Potassium: 3.1 mmol/L — ABNORMAL LOW (ref 3.5–5.1)
Sodium: 141 mmol/L (ref 135–145)

## 2021-03-10 LAB — CBC
HCT: 31.4 % — ABNORMAL LOW (ref 39.0–52.0)
Hemoglobin: 9.1 g/dL — ABNORMAL LOW (ref 13.0–17.0)
MCH: 20.3 pg — ABNORMAL LOW (ref 26.0–34.0)
MCHC: 29 g/dL — ABNORMAL LOW (ref 30.0–36.0)
MCV: 69.9 fL — ABNORMAL LOW (ref 80.0–100.0)
Platelets: 295 10*3/uL (ref 150–400)
RBC: 4.49 MIL/uL (ref 4.22–5.81)
RDW: 20.1 % — ABNORMAL HIGH (ref 11.5–15.5)
WBC: 6.6 10*3/uL (ref 4.0–10.5)
nRBC: 0 % (ref 0.0–0.2)

## 2021-03-10 LAB — TROPONIN I (HIGH SENSITIVITY)
Troponin I (High Sensitivity): 6 ng/L (ref <18)
Troponin I (High Sensitivity): 7 ng/L (ref <18)

## 2021-03-10 MED ORDER — ASPIRIN 81 MG PO CHEW
324.0000 mg | CHEWABLE_TABLET | Freq: Once | ORAL | Status: AC
  Administered 2021-03-10: 324 mg via ORAL
  Filled 2021-03-10: qty 4

## 2021-03-10 MED ORDER — PANTOPRAZOLE SODIUM 20 MG PO TBEC: 20.0000 mg | DELAYED_RELEASE_TABLET | Freq: Every day | ORAL | 0 refills | Status: DC

## 2021-03-10 MED ORDER — NITROGLYCERIN 0.4 MG SL SUBL: 0.4000 mg | SUBLINGUAL_TABLET | SUBLINGUAL | 3 refills | Status: AC | PRN

## 2021-03-10 MED ORDER — NITROGLYCERIN 2 % TD OINT
1.0000 [in_us] | TOPICAL_OINTMENT | Freq: Once | TRANSDERMAL | Status: AC
  Administered 2021-03-10: 1 [in_us] via TOPICAL
  Filled 2021-03-10: qty 1

## 2021-03-10 MED ORDER — POTASSIUM CHLORIDE CRYS ER 20 MEQ PO TBCR
40.0000 meq | EXTENDED_RELEASE_TABLET | Freq: Once | ORAL | Status: AC
  Administered 2021-03-10: 40 meq via ORAL
  Filled 2021-03-10: qty 2

## 2021-03-10 NOTE — ED Notes (Signed)
MD to triage to eval.  Pt states he will go to ED for further eval.    Patient is being discharged from the Urgent Care and sent to the Emergency Department via private vehicle . Per Dr Tracie Harrier, patient is in need of higher level of care due to add'l eval. Patient is aware and verbalizes understanding of plan of care.  Vitals:   03/10/21 1635  BP: (!) 173/89  Pulse: 82  Resp: 18  Temp: 98.7 F (37.1 C)  SpO2: 100%

## 2021-03-10 NOTE — Discharge Instructions (Signed)
Follow-up with your primary care doctor to discuss following up with a cardiologist.  Continue your current medications.  Return as needed for worsening symptoms

## 2021-03-10 NOTE — ED Triage Notes (Signed)
Pt c/o chest pain intermittently x1 week. Pt c/o SOB with exertion today. Pt has hx of MI. Pt states he was feeling the chest pain today and took Nitroglycerin and afterwards felt like bricks were on his chest.

## 2021-03-10 NOTE — ED Triage Notes (Signed)
Pt reports worsening SOB with exertion x 10 days.  In mornings feels like he's strangling with fluids in lungs.  No cough, fevers, chills.  Also reports episodes of exertional CP "pressure" in L chest.  Denies dizziness, nausea, or pain radiating.  H/o stents approx 2 yrs ago.  Has not used his NTG tablets.

## 2021-03-10 NOTE — ED Provider Notes (Addendum)
[facility] DEPT Provider Note   CSN: [id number] Arrival date & time: 03/10/21  1800     History Chief Complaint  Patient presents with   Chest Pain   Shortness of Breath    Wayne Santos is a 70 y.o. male.   Chest Pain Associated symptoms: shortness of breath   Shortness of Breath Associated symptoms: chest pain    HPI: A 70 year old patient with a history of treated diabetes, hypertension and hypercholesterolemia presents for evaluation of chest pain. Initial onset of pain was more than 6 hours ago. The patient's chest pain is described as heaviness/pressure/tightness and is worse with exertion. The patient's chest pain is middle- or left-sided, is not well-localized, is not sharp and does not radiate to the arms/jaw/neck. The patient does not complain of nausea and denies diaphoresis. The patient has no history of stroke, has no history of peripheral artery disease, has not smoked in the past 90 days, has no relevant family history of coronary artery disease (first degree relative at less than age 8) and does not have an elevated BMI (>=30).  Patient states he has had intermittent episodes of the past week however symptoms have been progressing in severity.  Today he noticed he was getting very short of breath with any exertion.  He felt severe intense pressure today and tried taking nitroglycerin but his symptoms persisted so he came to the ED Past Medical History:  Diagnosis Date   Anemia    AVN (avascular necrosis of bone) (HCC)    Benign paroxysmal positional vertigo 06/15/2015   CAD (coronary artery disease), native coronary artery    a. 08/24/16 NSTEMI/PCI: LM nl, LAD 40p, D1 60ost, D2 80ost-small, LCX 35m(2.5x30 ONyx DES), OM1/2 min irregs, OM3 nl, RCA 30p/m, RPDA/RPAV/RPL1/RPL2/RPL3 nl, EF 55-65%.   Childhood asthma    Dizziness 05/20/2020   Former smoker 12/28/2020   GERD (gastroesophageal reflux disease)    Glaucoma    History of  pneumonia    "4 times in my life" (08/24/2016)   Hyperlipidemia    Hypertension    Memory impairment 06/15/2015   Normochromic normocytic anemia 12/16/2019   Osteoporosis    Poison ivy 12/26/2018   Primary localized osteoarthrosis of left hip 07/05/2016   Seasonal allergies    Tobacco abuse     Patient Active Problem List   Diagnosis Date Noted   Former smoker 12/28/2020   Dizziness 05/20/2020   Normochromic normocytic anemia 12/16/2019   Poison ivy 12/26/2018   CAD (coronary artery disease), native coronary artery 01/11/2017   NSTEMI (non-ST elevated myocardial infarction) ([facility] 08/24/2016   Primary localized osteoarthrosis of left hip 07/05/2016   Primary osteoarthritis of left hip 07/05/2016   Encounter for long-term (current) use of medications 05/18/2016   Routine screening for STI (sexually transmitted infection) 05/18/2016   Benign paroxysmal positional vertigo 06/15/2015   Memory impairment 06/15/2015   Benign essential HTN 06/02/2014   Type 1 diabetes mellitus with hyperglycemia ([facility] 02/04/2013   AVN (avascular necrosis of bone) ([facility]    Glaucoma    Hyperlipidemia 03/16/2011   ONYCHOMYCOSIS, TOENAILS 03/24/2010   MEMORY LOSS 12/21/2009   POSTOPERATIVE INFECTION 07/10/2008   Sinusitis, chronic 12/13/2007   URINARY TRACT INFECTION SITE NOT SPECIFIED 04/04/2007   BOILS, RECURRENT 11/14/2006   HIV disease ([facility] 09/12/2006   PANIC ATTACK 09/12/2006   Essential hypertension 09/12/2006   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   "caught it in a machine; had to be reconstructed"   JOINT REPLACEMENT     LEFT  HEART CATH AND CORONARY ANGIOGRAPHY N/A 08/24/2016   Procedure: Left Heart Cath and Coronary Angiography;  Surgeon: Wellington Hampshire, MD;  Location: [place] [facility];  Service: Cardiovascular;  Laterality: N/A;   TOTAL HIP ARTHROPLASTY Right    TOTAL HIP ARTHROPLASTY Left 07/05/2016   Procedure: TOTAL HIP ARTHROPLASTY ANTERIOR APPROACH;  Surgeon: Melrose Nakayama, MD;  Location: [place];  Service: Orthopedics;  Laterality: Left;       History reviewed. No pertinent family history.  Social History   Tobacco Use   Smoking status: Former    Packs/day: 0.50    Years: 47.00    Pack years: 23.50    Types: Cigarettes    Quit date: 09/06/2016    Years since quitting: 4.5   Smokeless tobacco: Never  Vaping Use   Vaping Use: Never used  Substance Use Topics   Alcohol use: Yes    Alcohol/week: 0.0 standard drinks    Comment: 08/24/2016 "I'll have a couple drinks maybe 4 times/year"   Drug use: Not Currently    Types: Marijuana    Comment: 08/24/2016 "nothing since my younger days"    Home Medications Prior to Admission medications   Medication Sig Start Date End Date Taking? Authorizing Provider  albuterol (VENTOLIN HFA) 108 (90 Base) MCG/ACT inhaler Inhale 2 puffs into the lungs 4 (four) times daily as needed for wheezing or shortness of breath. 05/30/20  Yes [provider]  aspirin EC 81 MG EC tablet Take 1 tablet (81 mg total) by mouth daily. 08/25/16  Yes Cheryln Manly, NP  bictegravir-emtricitabine-tenofovir AF (BIKTARVY) 50-200-25 MG TABS tablet Take 1 tablet by mouth daily. 05/20/20  Yes Tommy Medal, Lavell Islam, MD  cetirizine (ZYRTEC) 10 MG tablet Take 10 mg by mouth daily as needed for allergies or rhinitis.   Yes [provider]  fluticasone (FLONASE) 50 MCG/ACT nasal spray Place 2 sprays into both nostrils daily as needed for allergies or rhinitis. 12/04/19  Yes [provider]  nitroGLYCERIN (NITROSTAT) 0.4 MG SL tablet Place 1 tablet (0.4 mg total) under  the tongue every 5 (five) minutes x 3 doses as needed for chest pain. 08/25/16  Yes Reino Bellis B, NP  pantoprazole (PROTONIX) 20 MG tablet Take 1 tablet (20 mg total) by mouth daily. 03/10/21  Yes Dorie Rank, MD  ACCU-CHEK GUIDE test strip 1 each by Other route 3 (three) times daily. 11/16/20   [provider]  amLODipine (NORVASC) 5 MG tablet Take 1 tablet (5 mg total) by mouth daily. 11/18/20   Truman Hayward, MD  B-D UF III MINI PEN NEEDLES 31G X 5 MM MISC Inject into the skin as directed. 09/25/20   [provider]  blood glucose meter kit and supplies KIT Dispense based on patient and insurance preference. Use up to four times daily as directed. (FOR ICD-9 250.00, 250.01). 07/27/17   Vanessa Kick, MD  Insulin Glargine (LANTUS) 100 UNIT/ML Solostar Pen Inject 10 Units into the skin daily at 10 pm. Patient taking differently: Inject 20 Units into the skin at bedtime. 07/27/17  Vanessa Kick, MD  metFORMIN (GLUCOPHAGE) 1000 MG tablet Take 1,000 mg by mouth 2 (two) times daily. 06/25/19   [provider]  rosuvastatin (CRESTOR) 40 MG tablet Take 1 tablet (40 mg total) by mouth daily. 03/05/20   Troy Sine, MD  Vitamin D, Ergocalciferol, (DRISDOL) 1.25 MG (50000 UNIT) CAPS capsule Take 50,000 Units by mouth once a week. 12/20/19   [provider]    Allergies    Ace inhibitors  Review of Systems   Review of Systems  Respiratory:  Positive for shortness of breath.   Cardiovascular:  Positive for chest pain.  All other systems reviewed and are negative.  Physical Exam Updated Vital Signs BP (!) 146/75   Pulse 69   Temp 98.3 F (36.8 C) (Oral)   Resp 19   SpO2 95%   Physical Exam Vitals and nursing note reviewed.  Constitutional:      General: He is not in acute distress.    Appearance: He is well-developed.  HENT:     Head: Normocephalic and atraumatic.     Right Ear: External ear normal.     Left Ear: External ear normal.  Eyes:      General: No scleral icterus.       Right eye: No discharge.        Left eye: No discharge.     Conjunctiva/sclera: Conjunctivae normal.  Neck:     Trachea: No tracheal deviation.  Cardiovascular:     Rate and Rhythm: Normal rate and regular rhythm.  Pulmonary:     Effort: Pulmonary effort is normal. No respiratory distress.     Breath sounds: Normal breath sounds. No stridor. No wheezing or rales.  Abdominal:     General: Bowel sounds are normal. There is no distension.     Palpations: Abdomen is soft.     Tenderness: There is no abdominal tenderness. There is no guarding or rebound.  Musculoskeletal:        General: No tenderness or deformity.     Cervical back: Neck supple.  Skin:    General: Skin is warm and dry.     Findings: No rash.  Neurological:     General: No focal deficit present.     Mental Status: He is alert.     Cranial Nerves: No cranial nerve deficit (no facial droop, extraocular movements intact, no slurred speech).     Sensory: No sensory deficit.     Motor: No abnormal muscle tone or seizure activity.     Coordination: Coordination normal.  Psychiatric:        Mood and Affect: Mood normal.    ED Results / Procedures / Treatments   Labs (all labs ordered are listed, but only abnormal results are displayed) Labs Reviewed  BASIC METABOLIC PANEL - Abnormal; Notable for the following components:      Result Value   Potassium 3.1 (*)    Glucose, Bld 124 (*)    All other components within normal limits  CBC - Abnormal; Notable for the following components:   Hemoglobin 9.1 (*)    HCT 31.4 (*)    MCV 69.9 (*)    MCH 20.3 (*)    MCHC 29.0 (*)    RDW 20.1 (*)    All other components within normal limits  TROPONIN I (HIGH SENSITIVITY)  TROPONIN I (HIGH SENSITIVITY)    EKG EKG Interpretation  Date/Time:  Wednesday March 10 2021 18:20:24 EDT Ventricular Rate:  85 PR Interval:  180 QRS Duration: 92 QT Interval:  363 QTC Calculation: 432 R  Axis:   51 Text Interpretation: Sinus rhythm Borderline repol abnormality, lateral leads No significant change since last tracing Confirmed by Dorie Rank [phone]) on 03/10/2021 6:34:21 PM  Radiology DG Chest Portable 1 View  Result Date: 03/10/2021 CLINICAL DATA:  Chest pain EXAM: PORTABLE CHEST 1 VIEW COMPARISON:  03/22/2017 FINDINGS: Cardiac enlargement. Normal vascularity. Lungs clear without infiltrate effusion or edema. IMPRESSION: No active disease. Electronically Signed   By: Franchot Gallo M.D.   On: 03/10/2021 19:28    Procedures Procedures   Medications Ordered in ED Medications  potassium chloride SA (KLOR-CON) CR tablet 40 mEq (has no administration in time range)  aspirin chewable tablet 324 mg (324 mg Oral Given 03/10/21 1916)  nitroGLYCERIN (NITROGLYN) 2 % ointment 1 inch (1 inch Topical Given 03/10/21 1917)    ED Course  I have reviewed the triage vital signs and the nursing notes.  Pertinent labs & imaging results that were available during my care of the patient were reviewed by me and considered in my medical decision making (see chart for details).  Clinical Course as of 03/10/21 2217  Wed Mar 10, 2021  2123 Initial troponin is 6 [JK]  2124 CBC(!) Hemoglobin stable at 9.1 [JK]  4536 Basic metabolic panel(!) Metabolic panel notable for hypokalemia [JK]  2134 Serial troponins are normal. [JK]    Clinical Course User Index [JK] Dorie Rank, MD   MDM Rules/Calculators/A&P HEAR Score: 6                         Patient presented to ED with complaints of chest pain.  He has been having intermittent episodes and some occurring with exertion.  Patient states he has had a lot of stress recently.  He is not sure if that is a factor.  He also was noncompliant with his diet.  Does have history of coronary artery disease but has been taking his medications.  ED work-up is reassuring.  No signs of pneumonia.  EKG is unchanged and serial troponins are normal.  Mild hypokalemia  was noted with patient was given a dose of potassium.  Discussed findings with patient.  I do think he would benefit from f following up with a cardiologist considering his history of heart disease and his symptoms..  Patient is no longer seeing his cardiologist at this time.  He will plan to follow-up with his primary care doctor and his infectious disease doctor and discussed follow-up with a cardiologist with him.  Warning signs and precautions discussed. Final Clinical Impression(s) / ED Diagnoses Final diagnoses:  Chest pain, unspecified type    Rx / DC Orders ED Discharge Orders          Ordered    pantoprazole (PROTONIX) 20 MG tablet  Daily        03/10/21 2213             Dorie Rank, MD 03/10/21 2218    Dorie Rank, MD 03/10/21 2219

## 2021-03-30 ENCOUNTER — Ambulatory Visit (INDEPENDENT_AMBULATORY_CARE_PROVIDER_SITE_OTHER): Payer: Self-pay | Admitting: Podiatry

## 2021-03-30 DIAGNOSIS — Z91199 Patient's noncompliance with other medical treatment and regimen due to unspecified reason: Secondary | ICD-10-CM

## 2021-04-02 ENCOUNTER — Other Ambulatory Visit: Payer: Self-pay | Admitting: Cardiovascular Disease

## 2021-04-02 DIAGNOSIS — B20 Human immunodeficiency virus [HIV] disease: Secondary | ICD-10-CM

## 2021-04-03 NOTE — Progress Notes (Signed)
   Complete physical exam  Patient: Wayne Santos   DOB: 02/26/1998   70 y.o. Male  MRN: [id number]  Subjective:    No chief complaint on file.   Wayne Santos is a 70 y.o. male who presents today for a complete physical exam. She reports consuming a {diet types:17450} diet. {types:19826} She generally feels {DESC; WELL/FAIRLY WELL/POORLY:18703}. She reports sleeping {DESC; WELL/FAIRLY WELL/POORLY:18703}. She {does/does not:200015} have additional problems to discuss today.    Most recent fall risk assessment:    11/03/2021   10:42 AM  Fall Risk   Falls in the past year? 0  Number falls in past yr: 0  Injury with Fall? 0  Risk for fall due to : No Fall Risks  Follow up Falls evaluation completed     Most recent depression screenings:    11/03/2021   10:42 AM 09/24/2020   10:46 AM  PHQ 2/9 Scores  PHQ - 2 Score 0 0  PHQ- 9 Score 5     {VISON DENTAL STD PSA (Optional):27386}  {History (Optional):23778}  Patient Care Team: Jessup, Joy, NP as PCP - General (Nurse Practitioner)   Outpatient Medications Prior to Visit  Medication Sig   fluticasone (FLONASE) 50 MCG/ACT nasal spray Place 2 sprays into both nostrils in the morning and at bedtime. After 7 days, reduce to once daily.   norgestimate-ethinyl estradiol (SPRINTEC 28) 0.25-35 MG-MCG tablet Take 1 tablet by mouth daily.   Nystatin POWD Apply liberally to affected area 2 times per day   spironolactone (ALDACTONE) 100 MG tablet Take 1 tablet (100 mg total) by mouth daily.   No facility-administered medications prior to visit.    ROS        Objective:     There were no vitals taken for this visit. {Vitals History (Optional):23777}  Physical Exam   No results found for any visits on 12/09/21. {Show previous labs (optional):23779}    Assessment & Plan:    Routine Health Maintenance and Physical Exam  Immunization History  Administered Date(s) Administered   DTaP 05/12/1999, 07/08/1999,  09/16/1999, 06/01/2000, 12/16/2003   Hepatitis A 10/12/2007, 10/17/2008   Hepatitis B 02/27/1999, 04/06/1999, 09/16/1999   HiB (PRP-OMP) 05/12/1999, 07/08/1999, 09/16/1999, 06/01/2000   IPV 05/12/1999, 07/08/1999, 03/06/2000, 12/16/2003   Influenza,inj,Quad PF,6+ Mos 01/17/2014   Influenza-Unspecified 04/18/2012   MMR 03/06/2001, 12/16/2003   Meningococcal Polysaccharide 10/17/2011   Pneumococcal Conjugate-13 06/01/2000   Pneumococcal-Unspecified 09/16/1999, 11/30/1999   Tdap 10/17/2011   Varicella 03/06/2000, 10/12/2007    Health Maintenance  Topic Date Due   HIV Screening  Never done   Hepatitis C Screening  Never done   INFLUENZA VACCINE  12/07/2021   PAP-Cervical Cytology Screening  12/09/2021 (Originally 02/26/2020)   PAP SMEAR-Modifier  12/09/2021 (Originally 02/26/2020)   TETANUS/TDAP  12/09/2021 (Originally 10/16/2021)   HPV VACCINES  Discontinued   COVID-19 Vaccine  Discontinued    Discussed health benefits of physical activity, and encouraged her to engage in regular exercise appropriate for her age and condition.  Problem List Items Addressed This Visit   None Visit Diagnoses     Annual physical exam    -  Primary   Cervical cancer screening       Need for Tdap vaccination          No follow-ups on file.     Joy Jessup, NP   

## 2021-04-06 ENCOUNTER — Other Ambulatory Visit: Payer: Self-pay

## 2021-06-09 ENCOUNTER — Other Ambulatory Visit: Payer: Self-pay

## 2021-06-09 MED ORDER — BIKTARVY 50-200-25 MG PO TABS: 1.0000 | ORAL_TABLET | Freq: Every day | ORAL | 0 refills | Status: DC

## 2021-06-24 ENCOUNTER — Ambulatory Visit (INDEPENDENT_AMBULATORY_CARE_PROVIDER_SITE_OTHER): Payer: Medicare Other | Admitting: Infectious Disease

## 2021-06-24 ENCOUNTER — Ambulatory Visit (INDEPENDENT_AMBULATORY_CARE_PROVIDER_SITE_OTHER): Payer: Medicare Other

## 2021-06-24 ENCOUNTER — Other Ambulatory Visit: Payer: Self-pay

## 2021-06-24 ENCOUNTER — Encounter: Payer: Self-pay | Admitting: Infectious Disease

## 2021-06-24 VITALS — BP 182/75 | HR 99 | Temp 97.6°F | Ht 69.0 in | Wt 181.0 lb

## 2021-06-24 DIAGNOSIS — M8700 Idiopathic aseptic necrosis of unspecified bone: Secondary | ICD-10-CM | POA: Diagnosis not present

## 2021-06-24 DIAGNOSIS — I10 Essential (primary) hypertension: Secondary | ICD-10-CM

## 2021-06-24 DIAGNOSIS — I2510 Atherosclerotic heart disease of native coronary artery without angina pectoris: Secondary | ICD-10-CM

## 2021-06-24 DIAGNOSIS — H8110 Benign paroxysmal vertigo, unspecified ear: Secondary | ICD-10-CM

## 2021-06-24 DIAGNOSIS — Z23 Encounter for immunization: Secondary | ICD-10-CM

## 2021-06-24 DIAGNOSIS — B20 Human immunodeficiency virus [HIV] disease: Secondary | ICD-10-CM | POA: Diagnosis not present

## 2021-06-24 MED ORDER — BIKTARVY 50-200-25 MG PO TABS: 1.0000 | ORAL_TABLET | Freq: Every day | ORAL | 11 refills | Status: DC

## 2021-06-24 NOTE — Progress Notes (Signed)
Chief complaint followup HIV, now out of medications which he is upset about because he claims that staff failed to respond to his calls to them and with Pharmacy Subjective:    Patient ID: Wayne Santos, male    DOB: [birth year], 71 y.o.   MRN: [id number]  HPI  Wayne Santos is a 71 y.o. male who is doing superbly well on his  antiviral regimen, SYMTUZA  [name] was diagnosed in 1987 he tells me and had a CD4 nadir then and then once he went on an ARV holiday per Dr. Ruffin Frederick instructions.  He is otherwise maintain strict adherence to his antivirals and remained undetectable  [name] was upset about not having received his meds and he came her in person he says asking for this and to speak to me. Shaquenia met with him durign our visit and things seem resolved at present.  I gave him a months worth of Biktarvy samples and I sent new prescription of BIKTARVY into the mail order for pharmacy of [facility] in [place].       Past Medical History:  Diagnosis Date   Anemia    AVN (avascular necrosis of bone) (HCC)    Benign paroxysmal positional vertigo 06/15/2015   CAD (coronary artery disease), native coronary artery    a. 08/24/16 NSTEMI/PCI: LM nl, LAD 40p, D1 60ost, D2 80ost-small, LCX 87m(2.5x30 ONyx DES), OM1/2 min irregs, OM3 nl, RCA 30p/m, RPDA/RPAV/RPL1/RPL2/RPL3 nl, EF 55-65%.   Childhood asthma    Dizziness 05/20/2020   Former smoker 12/28/2020   GERD (gastroesophageal reflux disease)    Glaucoma    History of pneumonia    "4 times in my life" (08/24/2016)   Hyperlipidemia    Hypertension    Memory impairment 06/15/2015   Normochromic normocytic anemia 12/16/2019   Osteoporosis    Poison ivy 12/26/2018   Primary localized osteoarthrosis of left hip 07/05/2016   Seasonal allergies    Tobacco abuse     Past Surgical History:  Procedure Laterality Date   CARDIAC CATHETERIZATION     CORONARY ANGIOPLASTY WITH STENT PLACEMENT  08/24/2016   CORONARY STENT INTERVENTION N/A  08/24/2016   Procedure: Coronary Stent Intervention;  Surgeon: MWellington Hampshire MD;  [facility];  Service: Cardiovascular;  Laterality: N/A;   HAND NERVE REPAIR Right 1990s?   "caught it in a machine; had to be reconstructed"   JOINT REPLACEMENT     LEFT HEART CATH AND CORONARY ANGIOGRAPHY N/A 08/24/2016   Procedure: Left Heart Cath and Coronary Angiography;  Surgeon: MWellington Hampshire MD;  [facility];  Service: Cardiovascular;  Laterality: N/A;   TOTAL HIP ARTHROPLASTY Right    TOTAL HIP ARTHROPLASTY Left 07/05/2016   Procedure: TOTAL HIP ARTHROPLASTY ANTERIOR APPROACH;  Surgeon: PMelrose Nakayama MD;  [facility]  Service: Orthopedics;  Laterality: Left;    No family history on file.    Social History   Socioeconomic History   Marital status: Single    Spouse name: Not on file   Number of children: Not on file   Years of education: Not on file   Highest education level: Not on file  Occupational History   Not on file  Tobacco Use   Smoking status: Former    Packs/day: 0.50    Years: 47.00    Pack years: 23.50    Types: Cigarettes    Quit date: 09/06/2016    Years since quitting: 4.8   Smokeless tobacco: Never  Vaping Use   Vaping Use: Never used  Substance and Sexual Activity   Alcohol use: Yes    Alcohol/week: 0.0 standard drinks    Comment: 08/24/2016 "I'll have a couple drinks maybe 4 times/year"   Drug use: Not Currently    Types: Marijuana    Comment: 08/24/2016 "nothing since my younger days"   Sexual activity: Not Currently    Comment: declined condoms.   Other Topics Concern   Not on file  Social History Narrative   Not on file   Social Determinants of Health   Financial Resource Strain: Not on file  Food Insecurity: Not on file  Transportation Needs: Not on file  Physical Activity: Not on file  Stress: Not on file  Social Connections: Not on file    Allergies  Allergen Reactions   Lisinopril Other (See Comments)  and Cough    Caused a terrible, persistent cough   Ace Inhibitors Cough     Current Outpatient Medications:    ACCU-CHEK GUIDE test strip, 1 each by Other route 3 (three) times daily., Disp: , Rfl:    albuterol (VENTOLIN HFA) 108 (90 Base) MCG/ACT inhaler, Inhale 2 puffs into the lungs 4 (four) times daily as needed for wheezing or shortness of breath., Disp: , Rfl:    amLODipine (NORVASC) 5 MG tablet, Take 1 tablet (5 mg total) by mouth daily., Disp: 30 tablet, Rfl: 1   aspirin EC 81 MG EC tablet, Take 1 tablet (81 mg total) by mouth daily., Disp: , Rfl:    B-D UF III MINI PEN NEEDLES 31G X 5 MM MISC, Inject into the skin as directed., Disp: , Rfl:    bictegravir-emtricitabine-tenofovir AF (BIKTARVY) 50-200-25 MG TABS tablet, Take 1 tablet by mouth daily., Disp: 30 tablet, Rfl: 0   blood glucose meter kit and supplies KIT, Dispense based on patient and insurance preference. Use up to four times daily as directed. (FOR ICD-9 250.00, 250.01)., Disp: 1 each, Rfl: 0   cetirizine (ZYRTEC) 10 MG tablet, Take 10 mg by mouth daily as needed for allergies or rhinitis., Disp: , Rfl:    fluticasone (FLONASE) 50 MCG/ACT nasal spray, Place 2 sprays into both nostrils daily as needed for allergies or rhinitis., Disp: , Rfl:    Insulin Glargine (LANTUS) 100 UNIT/ML Solostar Pen, Inject 10 Units into the skin daily at 10 pm. (Patient taking differently: Inject 20 Units into the skin at bedtime.), Disp: 15 mL, Rfl: 0   metFORMIN (GLUCOPHAGE) 1000 MG tablet, Take 1,000 mg by mouth 2 (two) times daily., Disp: , Rfl:    nitroGLYCERIN (NITROSTAT) 0.4 MG SL tablet, Place 1 tablet (0.4 mg total) under the tongue every 5 (five) minutes x 3 doses as needed for chest pain., Disp: 25 tablet, Rfl: 3   pantoprazole (PROTONIX) 20 MG tablet, Take 1 tablet (20 mg total) by mouth daily., Disp: 14 tablet, Rfl: 0   rosuvastatin (CRESTOR) 40 MG tablet, TAKE 1 TABLET(40 MG) BY MOUTH DAILY, Disp: 90 tablet, Rfl: 0   Vitamin D,  Ergocalciferol, (DRISDOL) 1.25 MG (50000 UNIT) CAPS capsule, Take 50,000 Units by mouth every Monday., Disp: , Rfl:      Review of Systems  Constitutional:  Negative for activity change, appetite change, chills, diaphoresis, fatigue, fever and unexpected weight change.  HENT:  Negative for congestion, rhinorrhea, sinus pressure, sneezing, sore throat and trouble swallowing.   Eyes:  Negative for photophobia and visual disturbance.  Respiratory:  Negative for cough, chest tightness, shortness of breath,  wheezing and stridor.   Cardiovascular:  Negative for chest pain, palpitations and leg swelling.  Gastrointestinal:  Negative for abdominal distention, abdominal pain, anal bleeding, blood in stool, constipation, diarrhea, nausea and vomiting.  Genitourinary:  Negative for difficulty urinating, dysuria, flank pain and hematuria.  Musculoskeletal:  Negative for arthralgias, back pain, gait problem, joint swelling and myalgias.  Skin:  Negative for color change, pallor, rash and wound.  Neurological:  Negative for dizziness, tremors, weakness, light-headedness and headaches.  Hematological:  Negative for adenopathy. Does not bruise/bleed easily.  Psychiatric/Behavioral:  Negative for agitation, behavioral problems, confusion, decreased concentration, dysphoric mood, sleep disturbance and suicidal ideas.       Objective:   Physical Exam Constitutional:      Appearance: Normal appearance. He is well-developed.  HENT:     Head: Normocephalic and atraumatic.  Eyes:     General: No scleral icterus.       Right eye: No discharge.        Left eye: No discharge.     Conjunctiva/sclera: Conjunctivae normal.  Cardiovascular:     Rate and Rhythm: Normal rate and regular rhythm.  Pulmonary:     Effort: Pulmonary effort is normal. No respiratory distress.     Breath sounds: No wheezing.  Abdominal:     General: There is no distension.     Palpations: Abdomen is soft.  Musculoskeletal:         General: No tenderness. Normal range of motion.     Cervical back: Normal range of motion and neck supple.  Skin:    General: Skin is warm and dry.     Coloration: Skin is not pale.     Findings: No erythema or rash.  Neurological:     General: No focal deficit present.     Mental Status: He is alert and oriented to person, place, and time.  Psychiatric:        Mood and Affect: Mood normal.        Behavior: Behavior normal.        Thought Content: Thought content normal.        Judgment: Judgment normal.         Assessment & Plan:  HIV disease:  Check viral load CD4 count CBC CMP.  I am continue his Biktarvy prescription which is sent to the mail order pharmacy out of [place] gave him 28 days worth of tablets.  Diabetes mellitus on insulin and metformin  Hypertension: He is continued on amlodipine  Coronary disease continue on metoprolol Plavix Crestor.  Hyperlipidemia: Continuing on Crestor t  Vaccine counseling recommended updated bivalent  COVID 19 vaccine

## 2021-06-24 NOTE — Progress Notes (Signed)
° °  Covid-19 Vaccination Clinic  Name:  Wayne Santos    MRN: [id number] DOB: Sep 27, 1950  06/24/2021  [name] was observed post Covid-19 immunization for 15 minutes without incident. He was provided with Vaccine Information Sheet and instruction to access the V-Safe system.   [name] was instructed to call 911 with any severe reactions post vaccine: Difficulty breathing  Swelling of face and throat  A fast heartbeat  A bad rash all over body  Dizziness and weakness   Immunizations Administered     Name Date Dose VIS Date Route   Pfizer Covid-19 Vaccine Bivalent Booster 06/24/2021 11:55 AM 0.3 mL 01/06/2021 Intramuscular   Manufacturer: [employer], [employer]   Lot: [id number]   NDC: [address]-2      Sandie Ano, RN

## 2021-06-28 LAB — CBC WITH DIFFERENTIAL/PLATELET
Absolute Monocytes: 392 cells/uL (ref 200–950)
Basophils Absolute: 63 cells/uL (ref 0–200)
Basophils Relative: 0.9 %
Eosinophils Absolute: 434 cells/uL (ref 15–500)
Eosinophils Relative: 6.2 %
HCT: 32.1 % — ABNORMAL LOW (ref 38.5–50.0)
Hemoglobin: 9.3 g/dL — ABNORMAL LOW (ref 13.2–17.1)
Lymphs Abs: 2044 cells/uL (ref 850–3900)
MCH: 20.1 pg — ABNORMAL LOW (ref 27.0–33.0)
MCHC: 29 g/dL — ABNORMAL LOW (ref 32.0–36.0)
MCV: 69.3 fL — ABNORMAL LOW (ref 80.0–100.0)
MPV: 10.4 fL (ref 7.5–12.5)
Monocytes Relative: 5.6 %
Neutro Abs: 4067 cells/uL (ref 1500–7800)
Neutrophils Relative %: 58.1 %
Platelets: 367 10*3/uL (ref 140–400)
RBC: 4.63 10*6/uL (ref 4.20–5.80)
RDW: 17.2 % — ABNORMAL HIGH (ref 11.0–15.0)
Total Lymphocyte: 29.2 %
WBC: 7 10*3/uL (ref 3.8–10.8)

## 2021-06-28 LAB — T-HELPER CELLS (CD4) COUNT (NOT AT ARMC)
Absolute CD4: 619 cells/uL (ref 490–1740)
CD4 T Helper %: 32 % (ref 30–61)
Total lymphocyte count: 1921 cells/uL (ref 850–3900)

## 2021-06-28 LAB — COMPLETE METABOLIC PANEL WITH GFR
AG Ratio: 1.4 (calc) (ref 1.0–2.5)
ALT: 16 U/L (ref 9–46)
AST: 15 U/L (ref 10–35)
Albumin: 4.6 g/dL (ref 3.6–5.1)
Alkaline phosphatase (APISO): 81 U/L (ref 35–144)
BUN: 12 mg/dL (ref 7–25)
CO2: 28 mmol/L (ref 20–32)
Calcium: 10 mg/dL (ref 8.6–10.3)
Chloride: 107 mmol/L (ref 98–110)
Creat: 0.97 mg/dL (ref 0.70–1.28)
Globulin: 3.2 g/dL (calc) (ref 1.9–3.7)
Glucose, Bld: 140 mg/dL — ABNORMAL HIGH (ref 65–99)
Potassium: 3.8 mmol/L (ref 3.5–5.3)
Sodium: 145 mmol/L (ref 135–146)
Total Bilirubin: 0.4 mg/dL (ref 0.2–1.2)
Total Protein: 7.8 g/dL (ref 6.1–8.1)
eGFR: 84 mL/min/{1.73_m2} (ref >=60)

## 2021-06-28 LAB — LIPID PANEL
Cholesterol: 98 mg/dL (ref <200)
HDL: 41 mg/dL (ref >=40)
LDL Cholesterol (Calc): 38 mg/dL (calc)
Non-HDL Cholesterol (Calc): 57 mg/dL (calc) (ref <130)
Total CHOL/HDL Ratio: 2.4 (calc) (ref <5.0)
Triglycerides: 102 mg/dL (ref <150)

## 2021-06-28 LAB — HIV-1 RNA QUANT-NO REFLEX-BLD
HIV 1 RNA Quant: NOT DETECTED Copies/mL
HIV-1 RNA Quant, Log: NOT DETECTED Log cps/mL

## 2021-06-28 LAB — FLUORESCENT TREPONEMAL AB(FTA)-IGG-BLD: Fluorescent Treponemal ABS: REACTIVE — AB

## 2021-06-28 LAB — C. TRACHOMATIS/N. GONORRHOEAE RNA
C. trachomatis RNA, TMA: NOT DETECTED
N. gonorrhoeae RNA, TMA: NOT DETECTED

## 2021-06-28 LAB — RPR TITER: RPR Titer: 1:2 {titer} — ABNORMAL HIGH

## 2021-06-28 LAB — RPR: RPR Ser Ql: REACTIVE — AB

## 2021-06-28 LAB — CBC MORPHOLOGY

## 2021-07-01 ENCOUNTER — Encounter (HOSPITAL_COMMUNITY): Payer: Self-pay

## 2021-07-01 ENCOUNTER — Ambulatory Visit (HOSPITAL_COMMUNITY)
Admission: EM | Admit: 2021-07-01 | Discharge: 2021-07-01 | Disposition: A | Discharge status: Home / Self Care | Payer: Medicare Other | Attending: Internal Medicine | Admitting: Internal Medicine

## 2021-07-01 DIAGNOSIS — A084 Viral intestinal infection, unspecified: Secondary | ICD-10-CM

## 2021-07-01 MED ORDER — ONDANSETRON 4 MG PO TBDP: 4.0000 mg | ORAL_TABLET | Freq: Three times a day (TID) | ORAL | 0 refills | Status: DC | PRN

## 2021-07-01 MED ORDER — BENZONATATE 100 MG PO CAPS: 100.0000 mg | ORAL_CAPSULE | Freq: Three times a day (TID) | ORAL | 0 refills | Status: DC | PRN

## 2021-07-01 MED ORDER — EYE WASH OPHTH SOLN
OPHTHALMIC | Status: AC
  Filled 2021-07-01: qty 118

## 2021-07-01 NOTE — ED Triage Notes (Signed)
Pt presents to office for cough and congestion. He reports some rib pain after coughing.  Pt would like assistance getting his contact out.

## 2021-07-01 NOTE — Discharge Instructions (Addendum)
Increase oral fluid intake Take medications as prescribed If you have worsening symptoms please return to urgent care to be reevaluated If you have pain in the eye, redness, blurry vision, light sensitivity please return to urgent care to be reevaluated.

## 2021-07-02 ENCOUNTER — Other Ambulatory Visit: Payer: Self-pay | Admitting: Pharmacist

## 2021-07-02 DIAGNOSIS — B20 Human immunodeficiency virus [HIV] disease: Secondary | ICD-10-CM

## 2021-07-02 MED ORDER — BICTEGRAVIR-EMTRICITAB-TENOFOV 50-200-25 MG PO TABS: 1.0000 | ORAL_TABLET | Freq: Every day | ORAL | 0 refills | Status: AC

## 2021-07-02 NOTE — Progress Notes (Signed)
Medication Samples have been provided to the patient.  Drug name: Biktarvy        Strength: 50/200/25 mg       Qty: 28 tablets (4 bottles) LOT: [id number]   Exp.Date: 10/24  Dosing instructions: Take one tablet by mouth once daily  The patient has been instructed regarding the correct time, dose, and frequency of taking this medication, including desired effects and most common side effects.   Tejal Monroy, PharmD, CPP Clinical Pharmacist Practitioner Infectious Diseases Clinical Pharmacist [facility]  

## 2021-07-03 NOTE — ED Provider Notes (Signed)
[place]    CSN: [id number] Arrival date & time: 07/01/21  1647      History   Chief Complaint Chief Complaint  Patient presents with   Diarrhea   Cough   Generalized Body Aches    HPI Wayne Santos is a 71 y.o. male with history of chest congestion, nasal congestion and a nonproductive cough associated with chest pain.  Patient's symptoms started fairly abruptly and has been persistent.  He complains of a few episodes of diarrhea with nausea but no vomiting.  Diarrhea is nonmucoid or nonbloody.  No abdominal pain or distention.  No fever or chills.  No sick contacts.  No changes in dietary habits.  No recent travel.Marland Kitchen   HPI  Past Medical History:  Diagnosis Date   Anemia    AVN (avascular necrosis of bone) (HCC)    Benign paroxysmal positional vertigo 06/15/2015   CAD (coronary artery disease), native coronary artery    a. 08/24/16 NSTEMI/PCI: LM nl, LAD 40p, D1 60ost, D2 80ost-small, LCX 49m(2.5x30 ONyx DES), OM1/2 min irregs, OM3 nl, RCA 30p/m, RPDA/RPAV/RPL1/RPL2/RPL3 nl, EF 55-65%.   Childhood asthma    Dizziness 05/20/2020   Former smoker 12/28/2020   GERD (gastroesophageal reflux disease)    Glaucoma    History of pneumonia    "4 times in my life" (08/24/2016)   Hyperlipidemia    Hypertension    Memory impairment 06/15/2015   Normochromic normocytic anemia 12/16/2019   Osteoporosis    Poison ivy 12/26/2018   Primary localized osteoarthrosis of left hip 07/05/2016   Seasonal allergies    Tobacco abuse     Patient Active Problem List   Diagnosis Date Noted   Former smoker 12/28/2020   Dizziness 05/20/2020   Normochromic normocytic anemia 12/16/2019   Poison ivy 12/26/2018   CAD (coronary artery disease), native coronary artery 01/11/2017   NSTEMI (non-ST elevated myocardial infarction) ([facility] 08/24/2016   Primary localized osteoarthrosis of left hip 07/05/2016   Primary osteoarthritis of left hip 07/05/2016   Encounter for long-term (current) use of  medications 05/18/2016   Routine screening for STI (sexually transmitted infection) 05/18/2016   Benign paroxysmal positional vertigo 06/15/2015   Memory impairment 06/15/2015   Benign essential HTN 06/02/2014   Type 1 diabetes mellitus with hyperglycemia ([facility] 02/04/2013   AVN (avascular necrosis of bone) ([facility]    Glaucoma    Hyperlipidemia 03/16/2011   ONYCHOMYCOSIS, TOENAILS 03/24/2010   MEMORY LOSS 12/21/2009   POSTOPERATIVE INFECTION 07/10/2008   Sinusitis, chronic 12/13/2007   URINARY TRACT INFECTION SITE NOT SPECIFIED 04/04/2007   BOILS, RECURRENT 11/14/2006   HIV disease ([facility] 09/12/2006   PANIC ATTACK 09/12/2006   Essential hypertension 09/12/2006   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   "caught it in a machine; had to be reconstructed"   JOINT REPLACEMENT     LEFT HEART CATH AND CORONARY ANGIOGRAPHY N/A 08/24/2016   Procedure: Left Heart Cath and Coronary Angiography;  Surgeon: MWellington Hampshire MD;  [facility];  Service: Cardiovascular;  Laterality: N/A;   TOTAL HIP ARTHROPLASTY Right    TOTAL HIP ARTHROPLASTY  Left 07/05/2016   Procedure: TOTAL HIP ARTHROPLASTY ANTERIOR APPROACH;  Surgeon: Melrose Nakayama, MD;  Location: [facility];  Service: Orthopedics;  Laterality: Left;       Home Medications    Prior to Admission medications   Medication Sig Start Date End Date Taking? Authorizing Provider  benzonatate (TESSALON) 100 MG capsule Take 1 capsule (100 mg total) by mouth 3 (three) times daily as needed for cough. 07/01/21  Yes Nuriya Stuck, Myrene Galas, MD  ondansetron (ZOFRAN-ODT) 4 MG  disintegrating tablet Take 1 tablet (4 mg total) by mouth every 8 (eight) hours as needed for nausea or vomiting. 07/01/21  Yes Katja Blue, Myrene Galas, MD  ACCU-CHEK GUIDE test strip 1 each by Other route 3 (three) times daily. 11/16/20   [provider]  albuterol (VENTOLIN HFA) 108 (90 Base) MCG/ACT inhaler Inhale 2 puffs into the lungs 4 (four) times daily as needed for wheezing or shortness of breath. 05/30/20   [provider]  amLODipine (NORVASC) 5 MG tablet Take 1 tablet (5 mg total) by mouth daily. 11/18/20   Truman Hayward, MD  aspirin EC 81 MG EC tablet Take 1 tablet (81 mg total) by mouth daily. 08/25/16   Cheryln Manly, NP  B-D UF III MINI PEN NEEDLES 31G X 5 MM MISC Inject into the skin as directed. 09/25/20   [provider]  bictegravir-emtricitabine-tenofovir AF (BIKTARVY) 50-200-25 MG TABS tablet Take 1 tablet by mouth daily. 06/24/21   Truman Hayward, MD  bictegravir-emtricitabine-tenofovir AF (BIKTARVY) 50-200-25 MG TABS tablet Take 1 tablet by mouth daily for 28 days. 06/24/21 07/22/21  Esmond Plants, RPH-CPP  blood glucose meter kit and supplies KIT Dispense based on patient and insurance preference. Use up to four times daily as directed. (FOR ICD-9 250.00, 250.01). 07/27/17   Vanessa Kick, MD  cetirizine (ZYRTEC) 10 MG tablet Take 10 mg by mouth daily as needed for allergies or rhinitis.    [provider]  fluticasone (FLONASE) 50 MCG/ACT nasal spray Place 2 sprays into both nostrils daily as needed for allergies or rhinitis. 12/04/19   [provider]  Insulin Glargine (LANTUS) 100 UNIT/ML Solostar Pen Inject 10 Units into the skin daily at 10 pm. Patient taking differently: Inject 20 Units into the skin at bedtime. 07/27/17   Vanessa Kick, MD  metFORMIN (GLUCOPHAGE) 1000 MG tablet Take 1,000 mg by mouth 2 (two) times daily. 06/25/19   [provider]  nitroGLYCERIN (NITROSTAT) 0.4 MG SL tablet Place 1 tablet (0.4 mg  total) under the tongue every 5 (five) minutes x 3 doses as needed for chest pain. 03/10/21   Dorie Rank, MD  pantoprazole (PROTONIX) 20 MG tablet Take 1 tablet (20 mg total) by mouth daily. 03/10/21   Dorie Rank, MD  rosuvastatin (CRESTOR) 40 MG tablet TAKE 1 TABLET(40 MG) BY MOUTH DAILY 04/05/21   Troy Sine, MD  Vitamin D, Ergocalciferol, (DRISDOL) 1.25 MG (50000 UNIT) CAPS capsule Take 50,000 Units by mouth every Monday. 12/20/19   [provider]    Family History History reviewed. No pertinent family history.  Social History Social History   Tobacco Use   Smoking status: Former    Packs/day: 0.50    Years: 47.00    Pack years: 23.50    Types: Cigarettes    Quit date: 09/06/2016    Years since quitting: 4.8   Smokeless tobacco: Never  Vaping Use   Vaping Use: Never used  Substance Use Topics  Alcohol use: Not Currently    Comment: 08/24/2016 "I'll have a couple drinks maybe 4 times/year"   Drug use: Not Currently    Types: Marijuana    Comment: 08/24/2016 "nothing since my younger days"     Allergies   Lisinopril and Ace inhibitors   Review of Systems Review of Systems  HENT:  Positive for congestion and rhinorrhea. Negative for sinus pressure, sinus pain and sore throat.   Respiratory:  Positive for cough. Negative for shortness of breath.   Cardiovascular:  Positive for chest pain.  Gastrointestinal:  Positive for diarrhea. Negative for abdominal pain, rectal pain and vomiting.  Genitourinary: Negative.   Musculoskeletal: Negative.     Physical Exam Triage Vital Signs ED Triage Vitals  Enc Vitals Group     BP 07/01/21 1739 (!) 166/78     Pulse Rate 07/01/21 1739 89     Resp 07/01/21 1739 16     Temp 07/01/21 1739 99 F (37.2 C)     Temp Source 07/01/21 1739 Oral     SpO2 07/01/21 1739 100 %     Weight --      Height --      Head Circumference --      Peak Flow --      Pain Score 07/01/21 1831 0     Pain Loc --      Pain Edu? --      Excl.  in [place]? --    No data found.  Updated Vital Signs BP (!) 166/78 (BP Location: Left Arm)    Pulse 89    Temp 99 F (37.2 C) (Oral)    Resp 16    SpO2 100%   Visual Acuity Right Eye Distance:   Left Eye Distance:   Bilateral Distance:    Right Eye Near:   Left Eye Near:    Bilateral Near:     Physical Exam Constitutional:      General: He is not in acute distress.    Appearance: He is not ill-appearing.  HENT:     Right Ear: Tympanic membrane normal.     Left Ear: Tympanic membrane normal.  Cardiovascular:     Rate and Rhythm: Normal rate and regular rhythm.     Pulses: Normal pulses.     Heart sounds: Normal heart sounds.  Pulmonary:     Effort: Pulmonary effort is normal.     Breath sounds: Normal breath sounds.  Abdominal:     General: Bowel sounds are normal.     Palpations: Abdomen is soft.  Musculoskeletal:        General: Normal range of motion.  Neurological:     Mental Status: He is alert.     UC Treatments / Results  Labs (all labs ordered are listed, but only abnormal results are displayed) Labs Reviewed - No data to display  EKG   Radiology No results found.  Procedures Procedures (including critical care time)  Medications Ordered in UC Medications - No data to display  Initial Impression / Assessment and Plan / UC Course  I have reviewed the triage vital signs and the nursing notes.  Pertinent labs & imaging results that were available during my care of the patient were reviewed by me and considered in my medical decision making (see chart for details).     1.  Viral gastroenteritis: Increase oral fluid intake Tessalon Perles as needed for cough Zofran as needed for nausea/vomiting Return precautions given Increase oral fluid intake  Final Clinical Impressions(s) / UC Diagnoses   Final diagnoses:  Viral gastroenteritis     Discharge Instructions      Increase oral fluid intake Take medications as prescribed If you have  worsening symptoms please return to urgent care to be reevaluated If you have pain in the eye, redness, blurry vision, light sensitivity please return to urgent care to be reevaluated.   ED Prescriptions     Medication Sig Dispense Auth. Provider   benzonatate (TESSALON) 100 MG capsule Take 1 capsule (100 mg total) by mouth 3 (three) times daily as needed for cough. 21 capsule Earsie Humm, Myrene Galas, MD   ondansetron (ZOFRAN-ODT) 4 MG disintegrating tablet Take 1 tablet (4 mg total) by mouth every 8 (eight) hours as needed for nausea or vomiting. 12 tablet Ranette Luckadoo, Myrene Galas, MD      PDMP not reviewed this encounter.   Chase Picket, MD 07/03/21 2200

## 2021-08-03 ENCOUNTER — Other Ambulatory Visit: Payer: Self-pay | Admitting: Cardiovascular Disease

## 2021-08-03 DIAGNOSIS — B20 Human immunodeficiency virus [HIV] disease: Secondary | ICD-10-CM

## 2021-08-04 ENCOUNTER — Other Ambulatory Visit: Payer: Self-pay | Admitting: Cardiovascular Disease

## 2021-08-04 DIAGNOSIS — B20 Human immunodeficiency virus [HIV] disease: Secondary | ICD-10-CM

## 2021-09-01 ENCOUNTER — Other Ambulatory Visit: Payer: Self-pay | Admitting: Cardiovascular Disease

## 2021-09-01 DIAGNOSIS — B20 Human immunodeficiency virus [HIV] disease: Secondary | ICD-10-CM

## 2021-09-30 ENCOUNTER — Other Ambulatory Visit: Payer: Self-pay | Admitting: Cardiovascular Disease

## 2021-09-30 DIAGNOSIS — B20 Human immunodeficiency virus [HIV] disease: Secondary | ICD-10-CM

## 2021-12-06 ENCOUNTER — Other Ambulatory Visit (HOSPITAL_COMMUNITY)
Admission: RE | Admit: 2021-12-06 | Discharge: 2021-12-06 | Disposition: A | Discharge status: Home / Self Care | Payer: Medicare Other | Source: Ambulatory Visit | Attending: Infectious Disease | Admitting: Infectious Disease

## 2021-12-06 ENCOUNTER — Other Ambulatory Visit: Payer: Medicare Other

## 2021-12-06 ENCOUNTER — Other Ambulatory Visit: Payer: Self-pay

## 2021-12-06 DIAGNOSIS — B20 Human immunodeficiency virus [HIV] disease: Secondary | ICD-10-CM

## 2021-12-06 NOTE — Addendum Note (Signed)
Addended by: Harley Alto on: 12/06/2021 03:54 PM   Modules accepted: Orders

## 2021-12-07 ENCOUNTER — Other Ambulatory Visit: Payer: Medicare Other

## 2021-12-07 LAB — T-HELPER CELLS (CD4) COUNT (NOT AT ARMC)
CD4 % Helper T Cell: 33 % (ref 33–65)
CD4 T Cell Abs: 614 /uL (ref 400–1790)

## 2021-12-08 LAB — COMPLETE METABOLIC PANEL WITH GFR
AG Ratio: 1.7 (calc) (ref 1.0–2.5)
ALT: 14 U/L (ref 9–46)
AST: 16 U/L (ref 10–35)
Albumin: 4.5 g/dL (ref 3.6–5.1)
Alkaline phosphatase (APISO): 67 U/L (ref 35–144)
BUN: 9 mg/dL (ref 7–25)
CO2: 25 mmol/L (ref 20–32)
Calcium: 10 mg/dL (ref 8.6–10.3)
Chloride: 107 mmol/L (ref 98–110)
Creat: 1.06 mg/dL (ref 0.70–1.28)
Globulin: 2.6 g/dL (calc) (ref 1.9–3.7)
Glucose, Bld: 205 mg/dL — ABNORMAL HIGH (ref 65–99)
Potassium: 3.6 mmol/L (ref 3.5–5.3)
Sodium: 141 mmol/L (ref 135–146)
Total Bilirubin: 0.3 mg/dL (ref 0.2–1.2)
Total Protein: 7.1 g/dL (ref 6.1–8.1)
eGFR: 75 mL/min/{1.73_m2} (ref >=60)

## 2021-12-08 LAB — LIPID PANEL
Cholesterol: 86 mg/dL (ref <200)
HDL: 37 mg/dL — ABNORMAL LOW (ref >=40)
LDL Cholesterol (Calc): 34 mg/dL (calc)
Non-HDL Cholesterol (Calc): 49 mg/dL (calc) (ref <130)
Total CHOL/HDL Ratio: 2.3 (calc) (ref <5.0)
Triglycerides: 73 mg/dL (ref <150)

## 2021-12-08 LAB — URINE CYTOLOGY ANCILLARY ONLY
Chlamydia: NEGATIVE
Comment: NEGATIVE
Comment: NORMAL
Neisseria Gonorrhea: NEGATIVE

## 2021-12-08 LAB — CBC WITH DIFFERENTIAL/PLATELET
Absolute Monocytes: 372 cells/uL (ref 200–950)
Basophils Absolute: 42 cells/uL (ref 0–200)
Basophils Relative: 0.7 %
Eosinophils Absolute: 282 cells/uL (ref 15–500)
Eosinophils Relative: 4.7 %
HCT: 29.5 % — ABNORMAL LOW (ref 38.5–50.0)
Hemoglobin: 8.7 g/dL — ABNORMAL LOW (ref 13.2–17.1)
Lymphs Abs: 1818 cells/uL (ref 850–3900)
MCH: 20.8 pg — ABNORMAL LOW (ref 27.0–33.0)
MCHC: 29.5 g/dL — ABNORMAL LOW (ref 32.0–36.0)
MCV: 70.4 fL — ABNORMAL LOW (ref 80.0–100.0)
MPV: 10.5 fL (ref 7.5–12.5)
Monocytes Relative: 6.2 %
Neutro Abs: 3486 cells/uL (ref 1500–7800)
Neutrophils Relative %: 58.1 %
Platelets: 281 10*3/uL (ref 140–400)
RBC: 4.19 10*6/uL — ABNORMAL LOW (ref 4.20–5.80)
RDW: 17.7 % — ABNORMAL HIGH (ref 11.0–15.0)
Total Lymphocyte: 30.3 %
WBC: 6 10*3/uL (ref 3.8–10.8)

## 2021-12-08 LAB — FLUORESCENT TREPONEMAL AB(FTA)-IGG-BLD: Fluorescent Treponemal ABS: REACTIVE — AB

## 2021-12-08 LAB — RPR TITER: RPR Titer: 1:1 {titer} — ABNORMAL HIGH

## 2021-12-08 LAB — HIV-1 RNA QUANT-NO REFLEX-BLD
HIV 1 RNA Quant: NOT DETECTED Copies/mL
HIV-1 RNA Quant, Log: NOT DETECTED Log cps/mL

## 2021-12-08 LAB — RPR: RPR Ser Ql: REACTIVE — AB

## 2021-12-18 IMAGING — DX DG CHEST 1V PORT
1 series · 1 of 1 positions shown · non-contrast
Comparison: 03/22/2017

CLINICAL DATA: Chest pain

EXAM:
PORTABLE CHEST 1 VIEW

[chest ap]
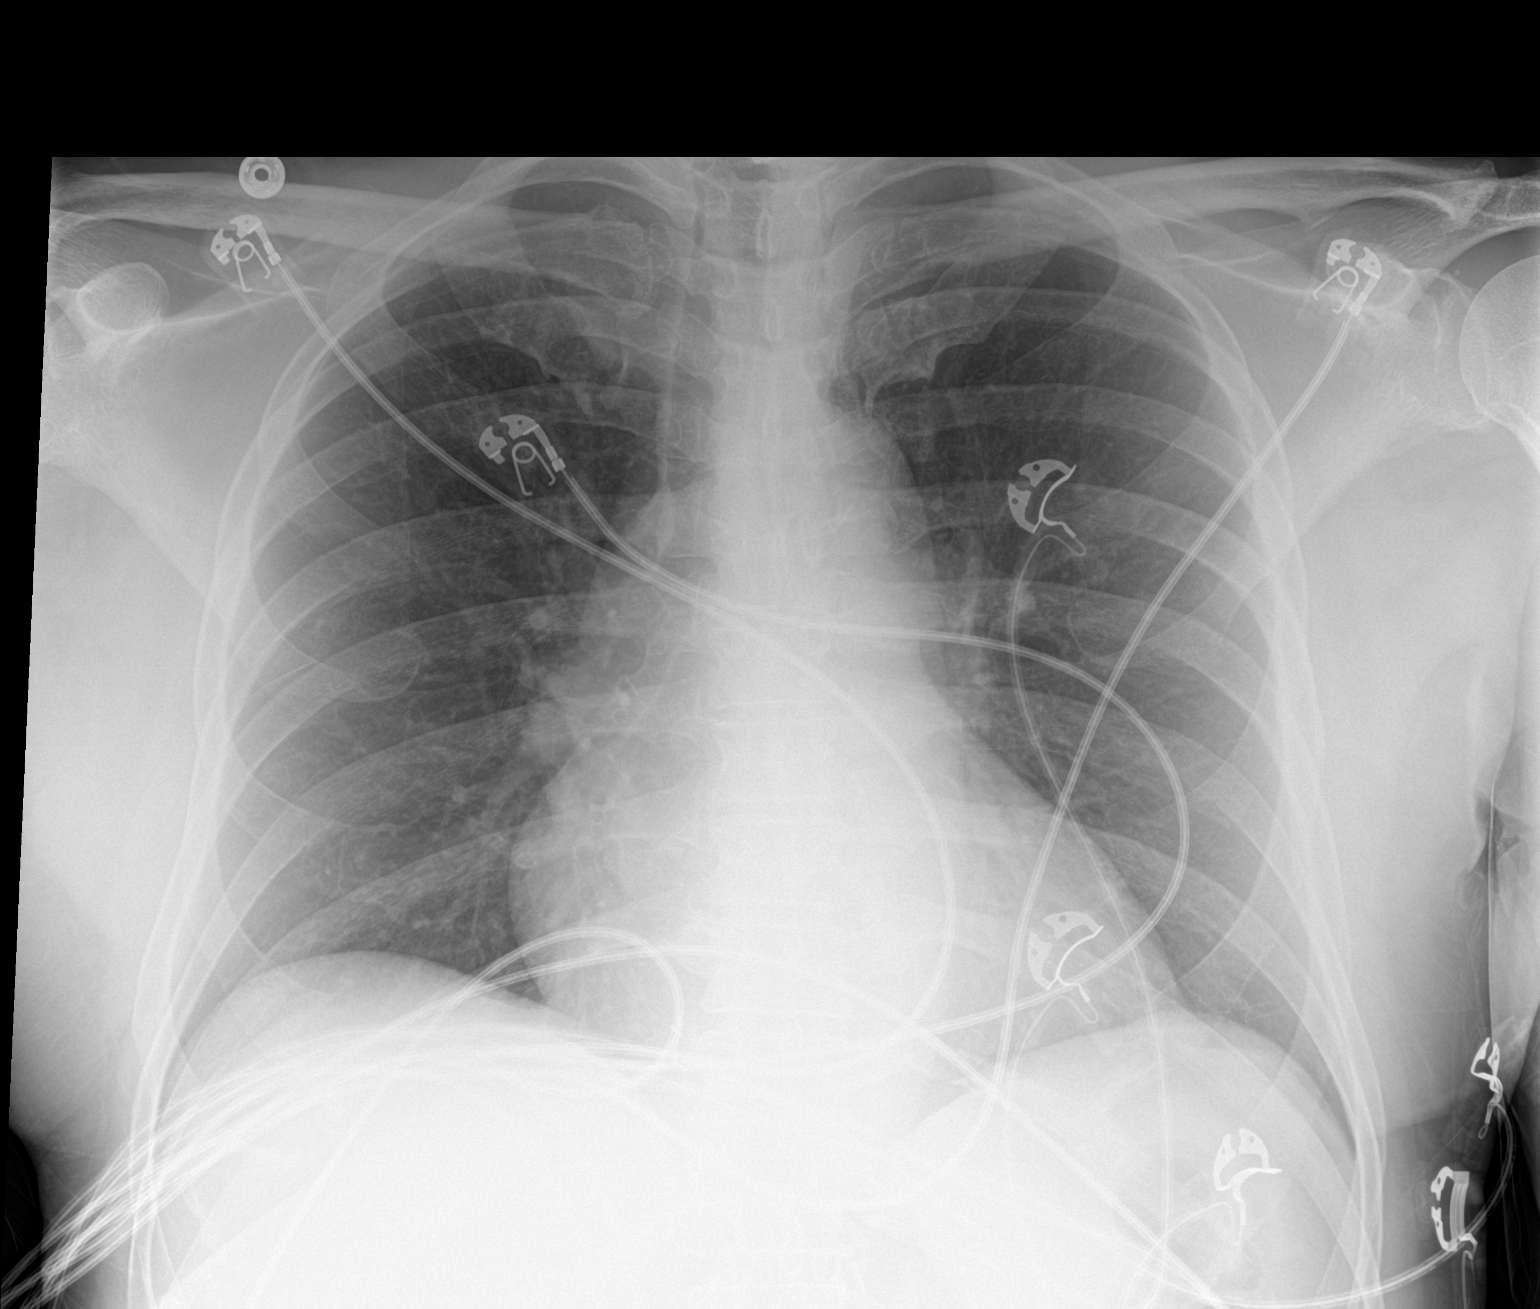

[1 of 1 positions shown; findings below may reference images not displayed]

FINDINGS: Cardiac enlargement. Normal vascularity. Lungs clear without
infiltrate effusion or edema.
IMPRESSION: No active disease.

## 2021-12-22 ENCOUNTER — Other Ambulatory Visit: Payer: Self-pay

## 2021-12-22 ENCOUNTER — Ambulatory Visit: Payer: Medicare Other | Admitting: Infectious Disease

## 2021-12-22 ENCOUNTER — Encounter: Payer: Self-pay | Admitting: Infectious Disease

## 2021-12-22 VITALS — BP 158/80 | HR 86 | Resp 16 | Ht 69.0 in | Wt 183.0 lb

## 2021-12-22 DIAGNOSIS — E119 Type 2 diabetes mellitus without complications: Secondary | ICD-10-CM

## 2021-12-22 DIAGNOSIS — I2510 Atherosclerotic heart disease of native coronary artery without angina pectoris: Secondary | ICD-10-CM

## 2021-12-22 DIAGNOSIS — B20 Human immunodeficiency virus [HIV] disease: Secondary | ICD-10-CM

## 2021-12-22 DIAGNOSIS — E7800 Pure hypercholesterolemia, unspecified: Secondary | ICD-10-CM | POA: Diagnosis not present

## 2021-12-22 DIAGNOSIS — I10 Essential (primary) hypertension: Secondary | ICD-10-CM

## 2021-12-22 DIAGNOSIS — Z794 Long term (current) use of insulin: Secondary | ICD-10-CM

## 2021-12-22 HISTORY — DX: Type 2 diabetes mellitus without complications: E11.9

## 2021-12-22 MED ORDER — BIKTARVY 50-200-25 MG PO TABS
1.0000 | ORAL_TABLET | Freq: Every day | ORAL | 11 refills | Status: DC
Start: 2021-12-22 — End: 2022-12-16

## 2021-12-22 NOTE — Progress Notes (Signed)
Chief complaint follow-up for HIV disease on medications. Subjective:    Patient ID: Wayne Santos, male    DOB: [birth year], 71 y.o.   MRN: [id number]  HPI  Wayne Santos is a 71 y.o. male who is doing superbly well on his  antiviral regimen, SYMTUZA--> Biktarvy.         Past Medical History:  Diagnosis Date   Anemia    AVN (avascular necrosis of bone) (HCC)    Benign paroxysmal positional vertigo 06/15/2015   CAD (coronary artery disease), native coronary artery    a. 08/24/16 NSTEMI/PCI: LM nl, LAD 40p, D1 60ost, D2 80ost-small, LCX 54m(2.5x30 ONyx DES), OM1/2 min irregs, OM3 nl, RCA 30p/m, RPDA/RPAV/RPL1/RPL2/RPL3 nl, EF 55-65%.   Childhood asthma    Dizziness 05/20/2020   Former smoker 12/28/2020   GERD (gastroesophageal reflux disease)    Glaucoma    History of pneumonia    "4 times in my life" (08/24/2016)   Hyperlipidemia    Hypertension    Memory impairment 06/15/2015   Normochromic normocytic anemia 12/16/2019   Osteoporosis    Poison ivy 12/26/2018   Primary localized osteoarthrosis of left hip 07/05/2016   Seasonal allergies    Tobacco abuse     Past Surgical History:  Procedure Laterality Date   CARDIAC CATHETERIZATION     CORONARY ANGIOPLASTY WITH STENT PLACEMENT  08/24/2016   CORONARY STENT INTERVENTION N/A 08/24/2016   Procedure: Coronary Stent Intervention;  Surgeon: MWellington Hampshire MD;  [facility];  Service: Cardiovascular;  Laterality: N/A;   HAND NERVE REPAIR Right 1990s?   "caught it in a machine; had to be reconstructed"   JOINT REPLACEMENT     LEFT HEART CATH AND CORONARY ANGIOGRAPHY N/A 08/24/2016   Procedure: Left Heart Cath and Coronary Angiography;  Surgeon: MWellington Hampshire MD;  [facility];  Service: Cardiovascular;  Laterality: N/A;   TOTAL HIP ARTHROPLASTY Right    TOTAL HIP ARTHROPLASTY Left 07/05/2016   Procedure: TOTAL HIP ARTHROPLASTY ANTERIOR APPROACH;  Surgeon: PMelrose Nakayama MD;  [facility]   Service: Orthopedics;  Laterality: Left;    No family history on file.    Social History   Socioeconomic History   Marital status: Single    Spouse name: Not on file   Number of children: Not on file   Years of education: Not on file   Highest education level: Not on file  Occupational History   Not on file  Tobacco Use   Smoking status: Former    Packs/day: 0.50    Years: 47.00    Total pack years: 23.50    Types: Cigarettes    Quit date: 09/06/2016    Years since quitting: 5.2   Smokeless tobacco: Never  Vaping Use   Vaping Use: Never used  Substance and Sexual Activity   Alcohol use: Not Currently    Comment: 08/24/2016 "I'll have a couple drinks maybe 4 times/year"   Drug use: Not Currently    Types: Marijuana    Comment: 08/24/2016 "nothing since my younger days"   Sexual activity: Not Currently    Comment: declined condoms.   Other Topics Concern   Not on file  Social History Narrative   Not on file   Social Determinants of Health   Financial Resource Strain: Not on file  Food Insecurity: Not on file  Transportation Needs: Not on file  Physical Activity: Not on file  Stress: Not on file  Social  Connections: Not on file    Allergies  Allergen Reactions   Lisinopril Other (See Comments) and Cough    Caused a terrible, persistent cough   Ace Inhibitors Cough     Current Outpatient Medications:    ACCU-CHEK GUIDE test strip, 1 each by Other route 3 (three) times daily., Disp: , Rfl:    albuterol (VENTOLIN HFA) 108 (90 Base) MCG/ACT inhaler, Inhale 2 puffs into the lungs 4 (four) times daily as needed for wheezing or shortness of breath., Disp: , Rfl:    amLODipine (NORVASC) 5 MG tablet, Take 1 tablet (5 mg total) by mouth daily., Disp: 30 tablet, Rfl: 1   aspirin EC 81 MG EC tablet, Take 1 tablet (81 mg total) by mouth daily., Disp: , Rfl:    B-D UF III MINI PEN NEEDLES 31G X 5 MM MISC, Inject into the skin as directed., Disp: , Rfl:    benzonatate  (TESSALON) 100 MG capsule, Take 1 capsule (100 mg total) by mouth 3 (three) times daily as needed for cough., Disp: 21 capsule, Rfl: 0   blood glucose meter kit and supplies KIT, Dispense based on patient and insurance preference. Use up to four times daily as directed. (FOR ICD-9 250.00, 250.01)., Disp: 1 each, Rfl: 0   cetirizine (ZYRTEC) 10 MG tablet, Take 10 mg by mouth daily as needed for allergies or rhinitis., Disp: , Rfl:    fluticasone (FLONASE) 50 MCG/ACT nasal spray, Place 2 sprays into both nostrils daily as needed for allergies or rhinitis., Disp: , Rfl:    Insulin Glargine (LANTUS) 100 UNIT/ML Solostar Pen, Inject 10 Units into the skin daily at 10 pm. (Patient taking differently: Inject 20 Units into the skin at bedtime.), Disp: 15 mL, Rfl: 0   metFORMIN (GLUCOPHAGE) 1000 MG tablet, Take 1,000 mg by mouth 2 (two) times daily., Disp: , Rfl:    nitroGLYCERIN (NITROSTAT) 0.4 MG SL tablet, Place 1 tablet (0.4 mg total) under the tongue every 5 (five) minutes x 3 doses as needed for chest pain., Disp: 25 tablet, Rfl: 3   ondansetron (ZOFRAN-ODT) 4 MG disintegrating tablet, Take 1 tablet (4 mg total) by mouth every 8 (eight) hours as needed for nausea or vomiting., Disp: 12 tablet, Rfl: 0   pantoprazole (PROTONIX) 20 MG tablet, Take 1 tablet (20 mg total) by mouth daily., Disp: 14 tablet, Rfl: 0   rosuvastatin (CRESTOR) 40 MG tablet, TAKE 1 TABLET(40 MG) BY MOUTH DAILY, Disp: 15 tablet, Rfl: 0   Vitamin D, Ergocalciferol, (DRISDOL) 1.25 MG (50000 UNIT) CAPS capsule, Take 50,000 Units by mouth every Monday., Disp: , Rfl:    bictegravir-emtricitabine-tenofovir AF (BIKTARVY) 50-200-25 MG TABS tablet, Take 1 tablet by mouth daily., Disp: 30 tablet, Rfl: 11     Review of Systems  Constitutional:  Negative for activity change, appetite change, chills, diaphoresis, fatigue, fever and unexpected weight change.  HENT:  Negative for congestion, rhinorrhea, sinus pressure, sneezing, sore throat and  trouble swallowing.   Eyes:  Negative for photophobia and visual disturbance.  Respiratory:  Negative for cough, chest tightness, shortness of breath, wheezing and stridor.   Cardiovascular:  Negative for chest pain, palpitations and leg swelling.  Gastrointestinal:  Negative for abdominal distention, abdominal pain, anal bleeding, blood in stool, constipation, diarrhea, nausea and vomiting.  Genitourinary:  Negative for difficulty urinating, dysuria, flank pain and hematuria.  Musculoskeletal:  Negative for arthralgias, back pain, gait problem, joint swelling and myalgias.  Skin:  Negative for color change, pallor, rash and  wound.  Neurological:  Negative for dizziness, tremors, weakness and light-headedness.  Hematological:  Negative for adenopathy. Does not bruise/bleed easily.  Psychiatric/Behavioral:  Negative for agitation, behavioral problems, confusion, decreased concentration, dysphoric mood and sleep disturbance.        Objective:   Physical Exam Constitutional:      Appearance: He is well-developed.  HENT:     Head: Normocephalic and atraumatic.  Eyes:     Conjunctiva/sclera: Conjunctivae normal.  Cardiovascular:     Rate and Rhythm: Normal rate and regular rhythm.  Pulmonary:     Effort: Pulmonary effort is normal. No respiratory distress.     Breath sounds: No wheezing.  Abdominal:     General: There is no distension.     Palpations: Abdomen is soft.  Musculoskeletal:        General: No tenderness. Normal range of motion.     Cervical back: Normal range of motion and neck supple.  Skin:    General: Skin is warm and dry.     Coloration: Skin is not pale.     Findings: No erythema or rash.  Neurological:     General: No focal deficit present.     Mental Status: He is alert and oriented to person, place, and time.  Psychiatric:        Mood and Affect: Mood normal.        Behavior: Behavior normal.        Thought Content: Thought content normal.        Judgment:  Judgment normal.          Assessment & Plan:  HIV disease:  I reviewed his most recent viral load in December 06, 2021 which was not detected and CD4 count was 614 from same date  Lab Results  Component Value Date   HIV1RNAQUANT Not Detected 12/06/2021   Lab Results  Component Value Date   CD4TABS 614 12/06/2021   CD4TABS 700 12/10/2020   CD4TABS 581 07/02/2020   Diabetes mellitus: He will continue on insulin and metformin  Coronary disease he will continue on Plavix metoprolol amlodipine and Crestor  Retention he is on amlodipine and metoprolol  Hyperlipidemia continue Crestor  Lipid Panel     Component Value Date/Time   CHOL 86 12/06/2021 1554   TRIG 73 12/06/2021 1554   HDL 37 (L) 12/06/2021 1554   CHOLHDL 2.3 12/06/2021 1554   VLDL 25 08/24/2016 0648   LDLCALC 34 12/06/2021 1554   LDLDIRECT 113 (H) 06/05/2017 1624     Hyperlipidemia: Continuing on Crestor t  Vaccine counseling recommended updated COVIDD 19 and flu vaccines in September/October

## 2021-12-29 ENCOUNTER — Other Ambulatory Visit: Payer: Self-pay

## 2021-12-29 ENCOUNTER — Ambulatory Visit: Payer: Medicare Other

## 2021-12-29 ENCOUNTER — Encounter: Payer: Self-pay | Admitting: Infectious Disease

## 2022-02-24 ENCOUNTER — Encounter: Payer: Self-pay | Admitting: Cardiology

## 2022-02-24 ENCOUNTER — Ambulatory Visit: Payer: Medicare Other | Admitting: Cardiology

## 2022-02-24 VITALS — BP 138/79 | HR 77 | Temp 97.5°F | Resp 16 | Ht 69.0 in | Wt 181.8 lb

## 2022-02-24 DIAGNOSIS — Z794 Long term (current) use of insulin: Secondary | ICD-10-CM

## 2022-02-24 DIAGNOSIS — I10 Essential (primary) hypertension: Secondary | ICD-10-CM

## 2022-02-24 DIAGNOSIS — I2510 Atherosclerotic heart disease of native coronary artery without angina pectoris: Secondary | ICD-10-CM

## 2022-02-24 DIAGNOSIS — B20 Human immunodeficiency virus [HIV] disease: Secondary | ICD-10-CM

## 2022-02-24 DIAGNOSIS — E785 Hyperlipidemia, unspecified: Secondary | ICD-10-CM

## 2022-02-24 DIAGNOSIS — Z955 Presence of coronary angioplasty implant and graft: Secondary | ICD-10-CM

## 2022-02-24 NOTE — Progress Notes (Signed)
ID:  [name], DOB [birth year], MRN [id number]  PCP:  Mack Hook, MD  Cardiologist:  Rex Kras, DO, [facility] (established care 02/24/2022) Former Cardiology Providers: Dr. Claiborne Billings.   REASON FOR CONSULT: Evaluation for CAD and establish care  REQUESTING PHYSICIAN:  Mack Hook, MD [address] [place],  [place] [id number]  Chief Complaint  Patient presents with   Coronary Artery Disease        New Patient (Initial Visit)    HPI  Wayne Santos is a 71 y.o. African-American male who presents to the clinic for evaluation of evaluation for CAD/establish care at the request of Mack Hook, MD. His past medical history and cardiovascular risk factors include: Hypertension, insulin-dependent diabetes mellitus type 2, hyperlipidemia, HIV positive, CAD s/p PCI Lcx, anemia of chronic disease, advanced age, former smoker.   In April 2018 patient presented to the ED with unstable angina and underwent left heart catheterization.  He was noted to have obstructive CAD in the LCx distribution and underwent stenting with 2.5 x 30 mm resolute Onyx drug-eluting stent.  The remaining of the nonobstructive CAD was managed medically.  He now presents to the office to transition his care from Dr. Evette Georges practice to ours given his comorbid conditions.  Overall he is doing well from a cardiovascular standpoint.  However, 6 weeks ago when he was in [place] walking at his normal pace he had some shortness of breath with exertion which was new for him.  The symptoms shortly subsided after taking rest and has not resurfaced.  FUNCTIONAL STATUS: 3-4 x a week went to the gym prior to [place]  pandemic. Stays active w/ day to day activities but  no structured exercise program or daily routine.  ALLERGIES: Allergies  Allergen Reactions   Lisinopril Other (See Comments) and Cough    Caused a terrible, persistent cough   Ace Inhibitors Cough    MEDICATION LIST PRIOR TO  VISIT: Current Meds  Medication Sig   ACCU-CHEK GUIDE test strip 1 each by Other route 3 (three) times daily.   albuterol (VENTOLIN HFA) 108 (90 Base) MCG/ACT inhaler Inhale 2 puffs into the lungs 4 (four) times daily as needed for wheezing or shortness of breath.   amLODipine (NORVASC) 5 MG tablet Take 1 tablet (5 mg total) by mouth daily.   aspirin EC 81 MG EC tablet Take 1 tablet (81 mg total) by mouth daily.   B-D UF III MINI PEN NEEDLES 31G X 5 MM MISC Inject into the skin as directed.   benzonatate (TESSALON) 100 MG capsule Take 1 capsule (100 mg total) by mouth 3 (three) times daily as needed for cough.   bictegravir-emtricitabine-tenofovir AF (BIKTARVY) 50-200-25 MG TABS tablet Take 1 tablet by mouth daily.   blood glucose meter kit and supplies KIT Dispense based on patient and insurance preference. Use up to four times daily as directed. (FOR ICD-9 250.00, 250.01).   cetirizine (ZYRTEC) 10 MG tablet Take 10 mg by mouth daily as needed for allergies or rhinitis.   fluticasone (FLONASE) 50 MCG/ACT nasal spray Place 2 sprays into both nostrils daily as needed for allergies or rhinitis.   Insulin Glargine (LANTUS) 100 UNIT/ML Solostar Pen Inject 10 Units into the skin daily at 10 pm. (Patient taking differently: Inject 20 Units into the skin at bedtime.)   metFORMIN (GLUCOPHAGE) 1000 MG tablet Take 1,000 mg by mouth 2 (two) times daily.   nitroGLYCERIN (NITROSTAT) 0.4 MG SL tablet Place 1 tablet (0.4 mg total)  under the tongue every 5 (five) minutes x 3 doses as needed for chest pain.   ondansetron (ZOFRAN-ODT) 4 MG disintegrating tablet Take 1 tablet (4 mg total) by mouth every 8 (eight) hours as needed for nausea or vomiting.   pantoprazole (PROTONIX) 20 MG tablet Take 1 tablet (20 mg total) by mouth daily.   rosuvastatin (CRESTOR) 40 MG tablet TAKE 1 TABLET(40 MG) BY MOUTH DAILY   Vitamin D, Ergocalciferol, (DRISDOL) 1.25 MG (50000 UNIT) CAPS capsule Take 50,000 Units by mouth every  Monday.     PAST MEDICAL HISTORY: Past Medical History:  Diagnosis Date   Anemia    AVN (avascular necrosis of bone) (HCC)    Benign paroxysmal positional vertigo 06/15/2015   CAD (coronary artery disease), native coronary artery    a. 08/24/16 NSTEMI/PCI: LM nl, LAD 40p, D1 60ost, D2 80ost-small, LCX 33m(2.5x30 ONyx DES), OM1/2 min irregs, OM3 nl, RCA 30p/m, RPDA/RPAV/RPL1/RPL2/RPL3 nl, EF 55-65%.   Childhood asthma    Dizziness 05/20/2020   Former smoker 12/28/2020   GERD (gastroesophageal reflux disease)    Glaucoma    History of pneumonia    "4 times in my life" (08/24/2016)   Hyperlipidemia    Hypertension    Memory impairment 06/15/2015   Normochromic normocytic anemia 12/16/2019   Osteoporosis    Poison ivy 12/26/2018   Primary localized osteoarthrosis of left hip 07/05/2016   Seasonal allergies    Tobacco abuse    Type 2 diabetes mellitus without complications ([place] 86/22/6333   PAST SURGICAL HISTORY: Past Surgical History:  Procedure Laterality Date   CARDIAC CATHETERIZATION     CORONARY ANGIOPLASTY WITH STENT PLACEMENT  08/24/2016   CORONARY STENT INTERVENTION N/A 08/24/2016   Procedure: Coronary Stent Intervention;  Surgeon: MWellington Hampshire MD;  [facility];  Service: Cardiovascular;  Laterality: N/A;   HAND NERVE REPAIR Right 1990s?   "caught it in a machine; had to be reconstructed"   JOINT REPLACEMENT     LEFT HEART CATH AND CORONARY ANGIOGRAPHY N/A 08/24/2016   Procedure: Left Heart Cath and Coronary Angiography;  Surgeon: MWellington Hampshire MD;  [facility];  Service: Cardiovascular;  Laterality: N/A;   TOTAL HIP ARTHROPLASTY Right    TOTAL HIP ARTHROPLASTY Left 07/05/2016   Procedure: TOTAL HIP ARTHROPLASTY ANTERIOR APPROACH;  Surgeon: PMelrose Nakayama MD;  [facility]  Service: Orthopedics;  Laterality: Left;    FAMILY HISTORY: The patient family history includes Cancer in his mother; Diabetes in his brother, sister, and  sister; Heart attack in his father; Hypertension in his brother and brother.  SOCIAL HISTORY:  The patient  reports that he quit smoking about 5 years ago. His smoking use included cigarettes. He has a 23.50 pack-year smoking history. He has never used smokeless tobacco. He reports that he does not currently use alcohol. He reports that he does not currently use drugs after having used the following drugs: Marijuana.  REVIEW OF SYSTEMS: Review of Systems  Cardiovascular:  Positive for dyspnea on exertion (see HPI). Negative for chest pain, claudication, irregular heartbeat, leg swelling, near-syncope, orthopnea, palpitations, paroxysmal nocturnal dyspnea and syncope.  Respiratory:  Negative for shortness of breath.   Hematologic/Lymphatic: Negative for bleeding problem.  Musculoskeletal:  Negative for muscle cramps and myalgias.  Neurological:  Negative for dizziness and light-headedness.    PHYSICAL EXAM:    02/24/2022    9:19 AM 12/22/2021    4:00 PM 07/01/2021    5:39 PM  Vitals with BMI  Height _0  _1    Weight 181 lbs 13 oz 183 lbs   BMI 34.91 79.15   Systolic 056 979 480  Diastolic 79 80 78  Pulse 77 86 89    Physical Exam  Constitutional: No distress.  Age appropriate, hemodynamically stable.   Neck: No JVD present.  Cardiovascular: Normal rate, regular rhythm, S1 normal, S2 normal, intact distal pulses and normal pulses. Exam reveals no gallop, no S3 and no S4.  No murmur heard. Pulmonary/Chest: Effort normal and breath sounds normal. No stridor. He has no wheezes. He has no rales.  Abdominal: Soft. Bowel sounds are normal. He exhibits no distension. There is no abdominal tenderness.  Musculoskeletal:        General: No edema.     Cervical back: Neck supple.  Neurological: He is alert and oriented to person, place, and time. He has intact cranial nerves (2-12).  Skin: Skin is warm and moist.   CARDIAC DATABASE: EKG: 02/24/2022 Sinus Rhythm, 66bpm, normal axis,  non-specific ST-T changes, without underlying injuy pattern.   Echocardiogram: Pending    Stress Testing: Pending   Heart Catheterization: 08/24/2016: ? Prox RCA lesion, 30 %stenosed. ? Mid RCA lesion, 30 %stenosed. ? Prox LAD lesion, 40 %stenosed. ? Ost 2nd Diag to 2nd Diag lesion, 80 %stenosed. ? Ost 1st Diag to 1st Diag lesion, 60 %stenosed. ? The left ventricular systolic function is normal. ? LV end diastolic pressure is normal. ? The left ventricular ejection fraction is 55-65% by visual estimate. ? A STENT RESOLUTE ONYX 2.5X30 drug eluting stent was successfully placed. ? Mid Cx lesion, 95 %stenosed. ? Post intervention, there is a 0% residual stenosis.  LABORATORY DATA:    Latest Ref Rng & Units 12/06/2021    3:54 PM 06/24/2021   12:11 PM 03/10/2021    6:51 PM  CBC  WBC 3.8 - 10.8 Thousand/uL 6.0  7.0  6.6   Hemoglobin 13.2 - 17.1 g/dL 8.7  9.3  9.1   Hematocrit 38.5 - 50.0 % 29.5  32.1  31.4   Platelets 140 - 400 Thousand/uL 281  367  295        Latest Ref Rng & Units 12/06/2021    3:54 PM 06/24/2021   12:11 PM 03/10/2021    6:51 PM  CMP  Glucose 65 - 99 mg/dL 205  140  124   BUN 7 - 25 mg/dL _2 Creatinine 0.70 - 1.28 mg/dL 1.06  0.97  0.90   Sodium 135 - 146 mmol/L 141  145  141   Potassium 3.5 - 5.3 mmol/L 3.6  3.8  3.1   Chloride 98 - 110 mmol/L 107  107  105   CO2 20 - 32 mmol/L _3 Calcium 8.6 - 10.3 mg/dL 10.0  10.0  9.4   Total Protein 6.1 - 8.1 g/dL 7.1  7.8    Total Bilirubin 0.2 - 1.2 mg/dL 0.3  0.4    AST 10 - 35 U/L 16  15    ALT 9 - 46 U/L 14  16      Lipid Panel     Component Value Date/Time   CHOL 86 12/06/2021 1554   TRIG 73 12/06/2021 1554   HDL 37 (L) 12/06/2021 1554   CHOLHDL 2.3 12/06/2021 1554   VLDL 25 08/24/2016 0648   LDLCALC 34 12/06/2021 1554   LDLDIRECT 113 (H) 06/05/2017 1624  No components found for: "NTPROBNP" No results for input(s): "PROBNP" in the last 8760 hours. No results for input(s):  "TSH" in the last 8760 hours.  BMP Recent Labs    03/10/21 1851 06/24/21 1211 12/06/21 1554  NA 141 145 141  K 3.1* 3.8 3.6  CL 105 107 107  CO2 _0 GLUCOSE 124* 140* 205*  BUN _1 CREATININE 0.90 0.97 1.06  CALCIUM 9.4 10.0 10.0  GFRNONAA >60  --   --     HEMOGLOBIN A1C Lab Results  Component Value Date   HGBA1C 6.1 (H) 08/24/2016   MPG 128 08/24/2016   External Labs: Collected: 07/21/2021 provided by referring physician. Total cholesterol 90, triglycerides 71, HDL 36, LDL 40, non-HDL 54. Hemoglobin 8.6, hematocrit 29.8% Sodium 142, potassium 4, chloride 103, bicarb 27, BUN 13, creatinine 1.15. AST 16, ALT 14, alkaline phosphatase 88. TSH 0.97   IMPRESSION:    ICD-10-CM   1. Coronary artery disease involving native coronary artery of native heart without angina pectoris  I25.10 EKG 12-Lead    PCV ECHOCARDIOGRAM COMPLETE    PCV MYOCARDIAL PERFUSION WO LEXISCAN    2. History of coronary angioplasty with insertion of stent  Z95.5 PCV ECHOCARDIOGRAM COMPLETE    PCV MYOCARDIAL PERFUSION WO LEXISCAN    3. Essential hypertension  I10     4. Type 2 diabetes mellitus with hyperglycemia, with long-term current use of insulin (HCC)  E11.65 PCV ECHOCARDIOGRAM COMPLETE   Z79.4 PCV MYOCARDIAL PERFUSION WO LEXISCAN    5. Long-term insulin use (HCC)  Z79.4     6. Hyperlipidemia with target LDL less than 70  E78.5     7. HIV disease ([place])  B20        RECOMMENDATIONS: ISSACHAR BROADY is a 71 y.o. African-American male whose past medical history and cardiac risk factors include: Hypertension, insulin-dependent diabetes mellitus type 2, hyperlipidemia, HIV positive, CAD s/p PCI Lcx, anemia of chronic disease, advanced age, former smoker.   Patient presents to the office to establish care given his history of CAD and prior PCI to the LCx.  Since his initial stenting in 2018 he has not had any cardiovascular work-up.  Recently he started noticing shortness of breath  with effort related activities most prominent episode 6 weeks ago.  Symptoms resolved after resting.  He did not have any anginal discomfort.  His last use of sublingual nitroglycerin tablet was proximately a year ago.  EKG shows sinus rhythm with nonspecific ST-T changes.  Hemodynamically patient is stable.  Reviewed outside labs and his lipids are well controlled.  Reemphasized importance of improving his modifiable cardiovascular risk factors (especially glycemic control).  From a cardiovascular standpoint given his history of CAD and recent episodes of dyspnea on exertion we will proceed with an echocardiogram to evaluate for LVEF and structural heart disease.  Exercise nuclear stress test to evaluate for functional capacity and reversible ischemia.  Educated on seeking medical attention sooner by going to the closest ER via EMS if the symptoms increase in intensity, frequency, duration, or has typical chest pain as discussed in the office.  Patient verbalized understanding.  Data Reviewed: I have independently reviewed external notes provided by the referring provider as part of this office visit.   I have independently reviewed labs, EKG, cath report, Dr. Evette Georges last progress note, as part of medical decision making. I have ordered the following tests:  Orders Placed This Encounter  Procedures   PCV MYOCARDIAL PERFUSION WO  LEXISCAN    Standing Status:   Future    Standing Expiration Date:   02/25/2023   EKG 12-Lead   PCV ECHOCARDIOGRAM COMPLETE    Standing Status:   Future    Standing Expiration Date:   02/25/2023  I have made no medications changes at today's encounter as noted above.  FINAL MEDICATION LIST END OF ENCOUNTER: No orders of the defined types were placed in this encounter.   There are no discontinued medications.   Current Outpatient Medications:    ACCU-CHEK GUIDE test strip, 1 each by Other route 3 (three) times daily., Disp: , Rfl:    albuterol (VENTOLIN HFA) 108  (90 Base) MCG/ACT inhaler, Inhale 2 puffs into the lungs 4 (four) times daily as needed for wheezing or shortness of breath., Disp: , Rfl:    amLODipine (NORVASC) 5 MG tablet, Take 1 tablet (5 mg total) by mouth daily., Disp: 30 tablet, Rfl: 1   aspirin EC 81 MG EC tablet, Take 1 tablet (81 mg total) by mouth daily., Disp: , Rfl:    B-D UF III MINI PEN NEEDLES 31G X 5 MM MISC, Inject into the skin as directed., Disp: , Rfl:    benzonatate (TESSALON) 100 MG capsule, Take 1 capsule (100 mg total) by mouth 3 (three) times daily as needed for cough., Disp: 21 capsule, Rfl: 0   bictegravir-emtricitabine-tenofovir AF (BIKTARVY) 50-200-25 MG TABS tablet, Take 1 tablet by mouth daily., Disp: 30 tablet, Rfl: 11   blood glucose meter kit and supplies KIT, Dispense based on patient and insurance preference. Use up to four times daily as directed. (FOR ICD-9 250.00, 250.01)., Disp: 1 each, Rfl: 0   cetirizine (ZYRTEC) 10 MG tablet, Take 10 mg by mouth daily as needed for allergies or rhinitis., Disp: , Rfl:    fluticasone (FLONASE) 50 MCG/ACT nasal spray, Place 2 sprays into both nostrils daily as needed for allergies or rhinitis., Disp: , Rfl:    Insulin Glargine (LANTUS) 100 UNIT/ML Solostar Pen, Inject 10 Units into the skin daily at 10 pm. (Patient taking differently: Inject 20 Units into the skin at bedtime.), Disp: 15 mL, Rfl: 0   metFORMIN (GLUCOPHAGE) 1000 MG tablet, Take 1,000 mg by mouth 2 (two) times daily., Disp: , Rfl:    nitroGLYCERIN (NITROSTAT) 0.4 MG SL tablet, Place 1 tablet (0.4 mg total) under the tongue every 5 (five) minutes x 3 doses as needed for chest pain., Disp: 25 tablet, Rfl: 3   ondansetron (ZOFRAN-ODT) 4 MG disintegrating tablet, Take 1 tablet (4 mg total) by mouth every 8 (eight) hours as needed for nausea or vomiting., Disp: 12 tablet, Rfl: 0   pantoprazole (PROTONIX) 20 MG tablet, Take 1 tablet (20 mg total) by mouth daily., Disp: 14 tablet, Rfl: 0   rosuvastatin (CRESTOR) 40 MG  tablet, TAKE 1 TABLET(40 MG) BY MOUTH DAILY, Disp: 15 tablet, Rfl: 0   Vitamin D, Ergocalciferol, (DRISDOL) 1.25 MG (50000 UNIT) CAPS capsule, Take 50,000 Units by mouth every Monday., Disp: , Rfl:   Orders Placed This Encounter  Procedures   PCV MYOCARDIAL PERFUSION WO LEXISCAN   EKG 12-Lead   PCV ECHOCARDIOGRAM COMPLETE    There are no Patient Instructions on file for this visit.   --Continue cardiac medications as reconciled in final medication list. --Return in about 8 weeks (around 04/21/2022) for Reevaluation of, Dyspnea, Review test results. or sooner if needed. --Continue follow-up with your primary care physician regarding the management of your other chronic comorbid conditions.  Patient's questions and concerns were addressed to his satisfaction. He voices understanding of the instructions provided during this encounter.   This note was created using a voice recognition software as a result there may be grammatical errors inadvertently enclosed that do not reflect the nature of this encounter. Every attempt is made to correct such errors.  Rex Kras, [place], [facility]  Pager: [phone] Office: [phone]

## 2022-03-28 ENCOUNTER — Ambulatory Visit: Payer: Medicare Other

## 2022-03-28 DIAGNOSIS — Z794 Long term (current) use of insulin: Secondary | ICD-10-CM

## 2022-03-28 DIAGNOSIS — Z955 Presence of coronary angioplasty implant and graft: Secondary | ICD-10-CM

## 2022-03-28 DIAGNOSIS — I2510 Atherosclerotic heart disease of native coronary artery without angina pectoris: Secondary | ICD-10-CM

## 2022-04-15 ENCOUNTER — Telehealth: Payer: Self-pay

## 2022-04-15 NOTE — Telephone Encounter (Signed)
Patient calling for results.

## 2022-04-15 NOTE — Telephone Encounter (Signed)
Echo results were released - see EMR notes.  Stress test low risk  We will review the results at the upcoming office visit.  Journe Hallmark [place], DO, [facility]

## 2022-04-15 NOTE — Telephone Encounter (Signed)
Patient aware.

## 2022-04-15 NOTE — Telephone Encounter (Signed)
Pt called regarding testing results. 2nd time calling per pt

## 2022-04-26 ENCOUNTER — Ambulatory Visit: Payer: Medicare Other | Admitting: Cardiology

## 2022-04-26 ENCOUNTER — Encounter: Payer: Self-pay | Admitting: Cardiology

## 2022-04-26 VITALS — BP 134/70 | HR 80 | Resp 16 | Ht 69.0 in | Wt 180.2 lb

## 2022-04-26 DIAGNOSIS — B20 Human immunodeficiency virus [HIV] disease: Secondary | ICD-10-CM

## 2022-04-26 DIAGNOSIS — Z794 Long term (current) use of insulin: Secondary | ICD-10-CM

## 2022-04-26 DIAGNOSIS — Z955 Presence of coronary angioplasty implant and graft: Secondary | ICD-10-CM

## 2022-04-26 DIAGNOSIS — I10 Essential (primary) hypertension: Secondary | ICD-10-CM

## 2022-04-26 DIAGNOSIS — E1165 Type 2 diabetes mellitus with hyperglycemia: Secondary | ICD-10-CM

## 2022-04-26 DIAGNOSIS — E785 Hyperlipidemia, unspecified: Secondary | ICD-10-CM

## 2022-04-26 DIAGNOSIS — I2510 Atherosclerotic heart disease of native coronary artery without angina pectoris: Secondary | ICD-10-CM

## 2022-04-26 NOTE — Progress Notes (Signed)
ID:  HIMMAT ENBERG, DOB [birth year], MRN [id number]  PCP:  Mack Hook, MD  Cardiologist:  Rex Kras, DO, [facility] (established care 02/24/2022) Former Cardiology Providers: Dr. Claiborne Billings.   Date: 04/26/22 Last Office Visit: 02/24/2022  Chief Complaint  Patient presents with   Shortness of Breath   Follow-up    8 weeks   Coronary Artery Disease    HPI  Wayne Santos is a 71 y.o. African-American male whose past medical history and cardiovascular risk factors include: Hypertension, insulin-dependent diabetes mellitus type 2, hyperlipidemia, HIV positive, CAD s/p PCI Lcx, anemia of chronic disease, advanced age, former smoker.   Referred to the practice to reestablish care from Dr. Evette Georges practice given his history of CAD and prior PCI.  In 2018 he presented to the ED with unstable angina underwent angiography and was noted to have obstructive CAD in the LCx distribution which was felt to be the culprit.  He underwent stenting with 2.5 x 30 mm resolute Onyx drug-eluting stent and the remaining of his CAD was treated medically.  Prior to establishing care patient was noticing shortness of breath with effort related activities and shared decision was to proceed with echo and exercise nuclear stress test.  Patient noted to have low normal LVEF without any significant valvular heart disease.  An exercise nuclear stress test was reported to be overall low risk.  Clinically, no longer experiencing shortness of breath with effort related activities.  FUNCTIONAL STATUS: 3-4 x a week went to the gym prior to [place]  pandemic. Stays active w/ day to day activities but  no structured exercise program or daily routine.  ALLERGIES: Allergies  Allergen Reactions   Lisinopril Other (See Comments) and Cough    Caused a terrible, persistent cough   Ace Inhibitors Cough    MEDICATION LIST PRIOR TO VISIT: Current Meds  Medication Sig   ACCU-CHEK GUIDE test strip 1 each by Other route 3  (three) times daily.   albuterol (VENTOLIN HFA) 108 (90 Base) MCG/ACT inhaler Inhale 2 puffs into the lungs 4 (four) times daily as needed for wheezing or shortness of breath.   amLODipine (NORVASC) 5 MG tablet Take 1 tablet (5 mg total) by mouth daily.   aspirin EC 81 MG EC tablet Take 1 tablet (81 mg total) by mouth daily.   B-D UF III MINI PEN NEEDLES 31G X 5 MM MISC Inject into the skin as directed.   benzonatate (TESSALON) 100 MG capsule Take 1 capsule (100 mg total) by mouth 3 (three) times daily as needed for cough.   bictegravir-emtricitabine-tenofovir AF (BIKTARVY) 50-200-25 MG TABS tablet Take 1 tablet by mouth daily.   blood glucose meter kit and supplies KIT Dispense based on patient and insurance preference. Use up to four times daily as directed. (FOR ICD-9 250.00, 250.01).   cetirizine (ZYRTEC) 10 MG tablet Take 10 mg by mouth daily as needed for allergies or rhinitis.   fluticasone (FLONASE) 50 MCG/ACT nasal spray Place 2 sprays into both nostrils daily as needed for allergies or rhinitis.   Insulin Glargine (LANTUS) 100 UNIT/ML Solostar Pen Inject 10 Units into the skin daily at 10 pm. (Patient taking differently: Inject 20 Units into the skin at bedtime.)   metFORMIN (GLUCOPHAGE) 1000 MG tablet Take 1,000 mg by mouth 2 (two) times daily.   nitroGLYCERIN (NITROSTAT) 0.4 MG SL tablet Place 1 tablet (0.4 mg total) under the tongue every 5 (five) minutes x 3 doses as needed for chest pain.  ondansetron (ZOFRAN-ODT) 4 MG disintegrating tablet Take 1 tablet (4 mg total) by mouth every 8 (eight) hours as needed for nausea or vomiting.   pantoprazole (PROTONIX) 20 MG tablet Take 1 tablet (20 mg total) by mouth daily.   rosuvastatin (CRESTOR) 40 MG tablet TAKE 1 TABLET(40 MG) BY MOUTH DAILY   Vitamin D, Ergocalciferol, (DRISDOL) 1.25 MG (50000 UNIT) CAPS capsule Take 50,000 Units by mouth every Monday.     PAST MEDICAL HISTORY: Past Medical History:  Diagnosis Date   Anemia    AVN  (avascular necrosis of bone) (HCC)    Benign paroxysmal positional vertigo 06/15/2015   CAD (coronary artery disease), native coronary artery    a. 08/24/16 NSTEMI/PCI: LM nl, LAD 40p, D1 60ost, D2 80ost-small, LCX 9m(2.5x30 ONyx DES), OM1/2 min irregs, OM3 nl, RCA 30p/m, RPDA/RPAV/RPL1/RPL2/RPL3 nl, EF 55-65%.   Childhood asthma    Dizziness 05/20/2020   Former smoker 12/28/2020   GERD (gastroesophageal reflux disease)    Glaucoma    History of pneumonia    "4 times in my life" (08/24/2016)   Hyperlipidemia    Hypertension    Memory impairment 06/15/2015   Normochromic normocytic anemia 12/16/2019   Osteoporosis    Poison ivy 12/26/2018   Primary localized osteoarthrosis of left hip 07/05/2016   Seasonal allergies    Tobacco abuse    Type 2 diabetes mellitus without complications ([place] 81/61/0960   PAST SURGICAL HISTORY: Past Surgical History:  Procedure Laterality Date   CARDIAC CATHETERIZATION     CORONARY ANGIOPLASTY WITH STENT PLACEMENT  08/24/2016   CORONARY STENT INTERVENTION N/A 08/24/2016   Procedure: Coronary Stent Intervention;  Surgeon: MWellington Hampshire MD;  [facility];  Service: Cardiovascular;  Laterality: N/A;   HAND NERVE REPAIR Right 1990s?   "caught it in a machine; had to be reconstructed"   JOINT REPLACEMENT     LEFT HEART CATH AND CORONARY ANGIOGRAPHY N/A 08/24/2016   Procedure: Left Heart Cath and Coronary Angiography;  Surgeon: MWellington Hampshire MD;  [facility];  Service: Cardiovascular;  Laterality: N/A;   TOTAL HIP ARTHROPLASTY Right    TOTAL HIP ARTHROPLASTY Left 07/05/2016   Procedure: TOTAL HIP ARTHROPLASTY ANTERIOR APPROACH;  Surgeon: PMelrose Nakayama MD;  [place] [facility]  Service: Orthopedics;  Laterality: Left;    FAMILY HISTORY: The patient family history includes Cancer in his mother; Diabetes in his brother, sister, and sister; Heart attack in his father; Hypertension in his brother and brother.  SOCIAL HISTORY:   The patient  reports that he quit smoking about 5 years ago. His smoking use included cigarettes. He has a 23.50 pack-year smoking history. He has never used smokeless tobacco. He reports current alcohol use. He reports that he does not currently use drugs after having used the following drugs: Marijuana.  REVIEW OF SYSTEMS: Review of Systems  Cardiovascular:  Negative for chest pain, claudication, dyspnea on exertion, irregular heartbeat, leg swelling, near-syncope, orthopnea, palpitations, paroxysmal nocturnal dyspnea and syncope.  Respiratory:  Negative for shortness of breath.   Hematologic/Lymphatic: Negative for bleeding problem.  Musculoskeletal:  Negative for muscle cramps and myalgias.  Neurological:  Negative for dizziness and light-headedness.    PHYSICAL EXAM:    04/26/2022    9:03 AM 02/24/2022    9:19 AM 12/22/2021    4:00 PM  Vitals with BMI  Height _0  _1  _2   Weight 180 lbs 3 oz 181 lbs 13 oz 183 lbs  BMI 26.6 23.55 73.22  Systolic 025 427 062  Diastolic 70 79 80  Pulse 80 77 86    Physical Exam  Constitutional: No distress.  Age appropriate, hemodynamically stable.   Neck: No JVD present.  Cardiovascular: Normal rate, regular rhythm, S1 normal, S2 normal, intact distal pulses and normal pulses. Exam reveals no gallop, no S3 and no S4.  No murmur heard. Pulmonary/Chest: Effort normal and breath sounds normal. No stridor. He has no wheezes. He has no rales.  Abdominal: Soft. Bowel sounds are normal. He exhibits no distension. There is no abdominal tenderness.  Musculoskeletal:        General: No edema.     Cervical back: Neck supple.  Neurological: He is alert and oriented to person, place, and time. He has intact cranial nerves (2-12).  Skin: Skin is warm and moist.   CARDIAC DATABASE: EKG: 02/24/2022 Sinus Rhythm, 66bpm, normal axis, non-specific ST-T changes, without underlying injuy pattern.   Echocardiogram: 03/28/2022:  Normal LV systolic  function with visual EF 50-55%. Left ventricle cavity is normal in size. Mild concentric hypertrophy of the left ventricle. Normal global wall motion. Normal diastolic filling pattern, normal LAP.  Structurally normal tricuspid valve with trace regurgitation. No evidence of pulmonary hypertension. No prior available for comparison.  Stress Testing: Exercise nuclear stress test 03/28/2022: Myocardial perfusion is normal. Overall LV systolic function is normal without regional wall motion abnormalities. Stress LV EF: 44%, visually appears to be at least lo normal.  Normal ECG stress. The patient exercised for 2 minutes and 18 seconds of a Bruce protocol, achieving approximately 4.64 METs & 85% MPHR. Markedly reduced exercise tolerance. No chest pain. Stress terminated due to fatigue and THR achieved. The blood pressure response was normal. No previous exam available for comparison. Low risk study.    Heart Catheterization: 08/24/2016: ? Prox RCA lesion, 30 %stenosed. ? Mid RCA lesion, 30 %stenosed. ? Prox LAD lesion, 40 %stenosed. ? Ost 2nd Diag to 2nd Diag lesion, 80 %stenosed. ? Ost 1st Diag to 1st Diag lesion, 60 %stenosed. ? The left ventricular systolic function is normal. ? LV end diastolic pressure is normal. ? The left ventricular ejection fraction is 55-65% by visual estimate. ? A STENT RESOLUTE ONYX 2.5X30 drug eluting stent was successfully placed. ? Mid Cx lesion, 95 %stenosed. ? Post intervention, there is a 0% residual stenosis.  LABORATORY DATA:    Latest Ref Rng & Units 12/06/2021    3:54 PM 06/24/2021   12:11 PM 03/10/2021    6:51 PM  CBC  WBC 3.8 - 10.8 Thousand/uL 6.0  7.0  6.6   Hemoglobin 13.2 - 17.1 g/dL 8.7  9.3  9.1   Hematocrit 38.5 - 50.0 % 29.5  32.1  31.4   Platelets 140 - 400 Thousand/uL 281  367  295        Latest Ref Rng & Units 12/06/2021    3:54 PM 06/24/2021   12:11 PM 03/10/2021    6:51 PM  CMP  Glucose 65 - 99 mg/dL 205  140  124   BUN 7 - 25  mg/dL _0 Creatinine 0.70 - 1.28 mg/dL 1.06  0.97  0.90   Sodium 135 - 146 mmol/L 141  145  141   Potassium 3.5 - 5.3 mmol/L 3.6  3.8  3.1   Chloride 98 - 110 mmol/L 107  107  105   CO2 20 - 32 mmol/L 25  28  26  Calcium 8.6 - 10.3 mg/dL 10.0  10.0  9.4   Total Protein 6.1 - 8.1 g/dL 7.1  7.8    Total Bilirubin 0.2 - 1.2 mg/dL 0.3  0.4    AST 10 - 35 U/L 16  15    ALT 9 - 46 U/L 14  16      Lipid Panel     Component Value Date/Time   CHOL 86 12/06/2021 1554   TRIG 73 12/06/2021 1554   HDL 37 (L) 12/06/2021 1554   CHOLHDL 2.3 12/06/2021 1554   VLDL 25 08/24/2016 0648   LDLCALC 34 12/06/2021 1554   LDLDIRECT 113 (H) 06/05/2017 1624    No components found for: "NTPROBNP" No results for input(s): "PROBNP" in the last 8760 hours. No results for input(s): "TSH" in the last 8760 hours.  BMP Recent Labs    06/24/21 1211 12/06/21 1554  NA 145 141  K 3.8 3.6  CL 107 107  CO2 28 25  GLUCOSE 140* 205*  BUN 12 9  CREATININE 0.97 1.06  CALCIUM 10.0 10.0    HEMOGLOBIN A1C Lab Results  Component Value Date   HGBA1C 6.1 (H) 08/24/2016   MPG 128 08/24/2016   External Labs: Collected: 07/21/2021 provided by referring physician. Total cholesterol 90, triglycerides 71, HDL 36, LDL 40, non-HDL 54. Hemoglobin 8.6, hematocrit 29.8% Sodium 142, potassium 4, chloride 103, bicarb 27, BUN 13, creatinine 1.15. AST 16, ALT 14, alkaline phosphatase 88. TSH 0.97   IMPRESSION:    ICD-10-CM   1. Coronary artery disease involving native coronary artery of native heart without angina pectoris  I25.10     2. History of coronary angioplasty with insertion of stent  Z95.5     3. Essential hypertension  I10     4. Type 2 diabetes mellitus with hyperglycemia, with long-term current use of insulin (HCC)  E11.65    Z79.4     5. Long-term insulin use (HCC)  Z79.4     6. Hyperlipidemia with target LDL less than 70  E78.5     7. HIV disease ([place])  B20         RECOMMENDATIONS: Wayne Santos is a 71 y.o. African-American male whose past medical history and cardiac risk factors include: Hypertension, insulin-dependent diabetes mellitus type 2, hyperlipidemia, HIV positive, CAD s/p PCI Lcx, anemia of chronic disease, advanced age, former smoker.   Coronary artery disease involving native coronary artery of native heart without angina pectoris Denies angina pectoris. Shortness of breath with effort related activities -resolved. Echocardiogram notes low normal LVEF without any significant valvular heart disease. Exercise nuclear stress test reported to be low risk with the exception of low functional capacity for age. Reemphasized the importance of improving his modifiable cardiovascular risk factors including glycemic control, lipids, and increasing physical activity as tolerated with a goal of 30 minutes a day 5 days a week. Since COVID pandemic patient has stopped exercising and therefore I have recommended to start with walking.  I would recommend a peak heart rate of 150 bpm as per his age.  Essential hypertension Office blood pressures are well-controlled. Medications reconciled. No changes warranted at this time.  Type 2 diabetes mellitus with hyperglycemia, with long-term current use of insulin ([place]) Reemphasized importance of glycemic control.  I have asked the patient to discuss adding ARB to his antihypertensive medications with his PCP.  This is in the setting of underlying diabetes for renal protection. Continue statin therapy.  Hyperlipidemia with target LDL less than  70 Currently on rosuvastatin.   He denies myalgia or other side effects. Currently managed by primary care provider.  FINAL MEDICATION LIST END OF ENCOUNTER: No orders of the defined types were placed in this encounter.   There are no discontinued medications.   Current Outpatient Medications:    ACCU-CHEK GUIDE test strip, 1 each by Other route 3 (three) times  daily., Disp: , Rfl:    albuterol (VENTOLIN HFA) 108 (90 Base) MCG/ACT inhaler, Inhale 2 puffs into the lungs 4 (four) times daily as needed for wheezing or shortness of breath., Disp: , Rfl:    amLODipine (NORVASC) 5 MG tablet, Take 1 tablet (5 mg total) by mouth daily., Disp: 30 tablet, Rfl: 1   aspirin EC 81 MG EC tablet, Take 1 tablet (81 mg total) by mouth daily., Disp: , Rfl:    B-D UF III MINI PEN NEEDLES 31G X 5 MM MISC, Inject into the skin as directed., Disp: , Rfl:    benzonatate (TESSALON) 100 MG capsule, Take 1 capsule (100 mg total) by mouth 3 (three) times daily as needed for cough., Disp: 21 capsule, Rfl: 0   bictegravir-emtricitabine-tenofovir AF (BIKTARVY) 50-200-25 MG TABS tablet, Take 1 tablet by mouth daily., Disp: 30 tablet, Rfl: 11   blood glucose meter kit and supplies KIT, Dispense based on patient and insurance preference. Use up to four times daily as directed. (FOR ICD-9 250.00, 250.01)., Disp: 1 each, Rfl: 0   cetirizine (ZYRTEC) 10 MG tablet, Take 10 mg by mouth daily as needed for allergies or rhinitis., Disp: , Rfl:    fluticasone (FLONASE) 50 MCG/ACT nasal spray, Place 2 sprays into both nostrils daily as needed for allergies or rhinitis., Disp: , Rfl:    Insulin Glargine (LANTUS) 100 UNIT/ML Solostar Pen, Inject 10 Units into the skin daily at 10 pm. (Patient taking differently: Inject 20 Units into the skin at bedtime.), Disp: 15 mL, Rfl: 0   metFORMIN (GLUCOPHAGE) 1000 MG tablet, Take 1,000 mg by mouth 2 (two) times daily., Disp: , Rfl:    nitroGLYCERIN (NITROSTAT) 0.4 MG SL tablet, Place 1 tablet (0.4 mg total) under the tongue every 5 (five) minutes x 3 doses as needed for chest pain., Disp: 25 tablet, Rfl: 3   ondansetron (ZOFRAN-ODT) 4 MG disintegrating tablet, Take 1 tablet (4 mg total) by mouth every 8 (eight) hours as needed for nausea or vomiting., Disp: 12 tablet, Rfl: 0   pantoprazole (PROTONIX) 20 MG tablet, Take 1 tablet (20 mg total) by mouth daily.,  Disp: 14 tablet, Rfl: 0   rosuvastatin (CRESTOR) 40 MG tablet, TAKE 1 TABLET(40 MG) BY MOUTH DAILY, Disp: 15 tablet, Rfl: 0   Vitamin D, Ergocalciferol, (DRISDOL) 1.25 MG (50000 UNIT) CAPS capsule, Take 50,000 Units by mouth every Monday., Disp: , Rfl:   No orders of the defined types were placed in this encounter.   There are no Patient Instructions on file for this visit.   --Continue cardiac medications as reconciled in final medication list. --Return in about 6 months (around 10/26/2022) for Follow up, CAD. or sooner if needed. --Continue follow-up with your primary care physician regarding the management of your other chronic comorbid conditions.  Patient's questions and concerns were addressed to his satisfaction. He voices understanding of the instructions provided during this encounter.   This note was created using a voice recognition software as a result there may be grammatical errors inadvertently enclosed that do not reflect the nature of this encounter. Every attempt is made  to correct such errors.  Rex Kras, [place], [facility]  Pager: [phone] Office: [phone]

## 2022-07-05 ENCOUNTER — Encounter (HOSPITAL_COMMUNITY): Payer: Self-pay

## 2022-07-05 ENCOUNTER — Ambulatory Visit (HOSPITAL_COMMUNITY)
Admission: EM | Admit: 2022-07-05 | Discharge: 2022-07-05 | Disposition: A | Discharge status: Home / Self Care | Payer: Medicare Other | Attending: Internal Medicine | Admitting: Internal Medicine

## 2022-07-05 DIAGNOSIS — H109 Unspecified conjunctivitis: Secondary | ICD-10-CM | POA: Diagnosis not present

## 2022-07-05 DIAGNOSIS — J329 Chronic sinusitis, unspecified: Secondary | ICD-10-CM | POA: Diagnosis not present

## 2022-07-05 MED ORDER — POLYMYXIN B-TRIMETHOPRIM 10000-0.1 UNIT/ML-% OP SOLN: 1.0000 [drp] | Freq: Three times a day (TID) | OPHTHALMIC | 0 refills | Status: AC

## 2022-07-05 MED ORDER — AMOXICILLIN-POT CLAVULANATE 875-125 MG PO TABS: 1.0000 | ORAL_TABLET | Freq: Two times a day (BID) | ORAL | 0 refills | Status: DC

## 2022-07-05 NOTE — ED Provider Notes (Signed)
[place]    CSN: [id number] Arrival date & time: 07/05/22  [id number]      History   Chief Complaint Chief Complaint  Patient presents with   Eye Pain   Facial Pain    HPI Wayne Santos is a 72 y.o. male who presents with persistent nose congestion x 2 months with green mucous and pain from R nostril and sinus pressure on R face. Uses Mucinex DM recommended by his PCP. Has never seen ENT. Denies hx of fever.   Also can't get his L contact off x 3 days. Onset of L eye drainage x 2 days. Admits of photophobia. Has hx of cataracts.     Past Medical History:  Diagnosis Date   Anemia    AVN (avascular necrosis of bone) (HCC)    Benign paroxysmal positional vertigo 06/15/2015   CAD (coronary artery disease), native coronary artery    a. 08/24/16 NSTEMI/PCI: LM nl, LAD 40p, D1 60ost, D2 80ost-small, LCX 47m(2.5x30 ONyx DES), OM1/2 min irregs, OM3 nl, RCA 30p/m, RPDA/RPAV/RPL1/RPL2/RPL3 nl, EF 55-65%.   Childhood asthma    Dizziness 05/20/2020   Former smoker 12/28/2020   GERD (gastroesophageal reflux disease)    Glaucoma    History of pneumonia    "4 times in my life" (08/24/2016)   Hyperlipidemia    Hypertension    Memory impairment 06/15/2015   Normochromic normocytic anemia 12/16/2019   Osteoporosis    Poison ivy 12/26/2018   Primary localized osteoarthrosis of left hip 07/05/2016   Seasonal allergies    Tobacco abuse    Type 2 diabetes mellitus without complications ([facility] [id number]   Patient Active Problem List   Diagnosis Date Noted   Type 2 diabetes mellitus without complications ([facility] [id number]  Former smoker 12/28/2020   Dizziness 05/20/2020   Normochromic normocytic anemia 12/16/2019   Poison ivy 12/26/2018   CAD (coronary artery disease), native coronary artery 01/11/2017   NSTEMI (non-ST elevated myocardial infarction) ([facility] 08/24/2016   Primary localized osteoarthrosis of left hip 07/05/2016   Primary osteoarthritis of left hip 07/05/2016    Encounter for long-term (current) use of medications 05/18/2016   Routine screening for STI (sexually transmitted infection) 05/18/2016   Benign paroxysmal positional vertigo 06/15/2015   Memory impairment 06/15/2015   Benign essential HTN 06/02/2014   Type 1 diabetes mellitus with hyperglycemia ([facility] 02/04/2013   AVN (avascular necrosis of bone) ([facility]    Glaucoma    Hyperlipidemia 03/16/2011   ONYCHOMYCOSIS, TOENAILS 03/24/2010   MEMORY LOSS 12/21/2009   POSTOPERATIVE INFECTION 07/10/2008   Sinusitis, chronic 12/13/2007   URINARY TRACT INFECTION SITE NOT SPECIFIED 04/04/2007   BOILS, RECURRENT 11/14/2006   HIV disease ([facility] 09/12/2006   PANIC ATTACK 09/12/2006   Essential hypertension 09/12/2006   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   "caught it in a machine; had to be reconstructed"   JOINT REPLACEMENT     LEFT HEART CATH AND CORONARY ANGIOGRAPHY N/A 08/24/2016   Procedure: Left Heart Cath and Coronary Angiography;  Surgeon: MWellington Hampshire MD;  [facility];  Service: Cardiovascular;  Laterality: N/A;  TOTAL HIP ARTHROPLASTY Right    TOTAL HIP ARTHROPLASTY Left 07/05/2016   Procedure: TOTAL HIP ARTHROPLASTY ANTERIOR APPROACH;  Surgeon: Melrose Nakayama, MD;  Location: [facility];  Service: Orthopedics;  Laterality: Left;       Home Medications    Prior to Admission medications   Medication Sig Start Date End Date Taking? Authorizing Provider  amoxicillin-clavulanate (AUGMENTIN) 875-125 MG tablet Take 1 tablet by mouth every 12 (twelve) hours. 07/05/22  Yes Rodriguez-Southworth, Sunday Spillers, PA-C   trimethoprim-polymyxin b (POLYTRIM) ophthalmic solution Place 1 drop into the left eye 3 (three) times daily. For 7 days 07/05/22  Yes Rodriguez-Southworth, Sunday Spillers, PA-C  ACCU-CHEK GUIDE test strip 1 each by Other route 3 (three) times daily. 11/16/20   [provider]  albuterol (VENTOLIN HFA) 108 (90 Base) MCG/ACT inhaler Inhale 2 puffs into the lungs 4 (four) times daily as needed for wheezing or shortness of breath. 05/30/20   [provider]  amLODipine (NORVASC) 5 MG tablet Take 1 tablet (5 mg total) by mouth daily. 11/18/20   Truman Hayward, MD  aspirin EC 81 MG EC tablet Take 1 tablet (81 mg total) by mouth daily. 08/25/16   Cheryln Manly, NP  B-D UF III MINI PEN NEEDLES 31G X 5 MM MISC Inject into the skin as directed. 09/25/20   [provider]  benzonatate (TESSALON) 100 MG capsule Take 1 capsule (100 mg total) by mouth 3 (three) times daily as needed for cough. 07/01/21   Chase Picket, MD  bictegravir-emtricitabine-tenofovir AF (BIKTARVY) 50-200-25 MG TABS tablet Take 1 tablet by mouth daily. 12/22/21   Truman Hayward, MD  blood glucose meter kit and supplies KIT Dispense based on patient and insurance preference. Use up to four times daily as directed. (FOR ICD-9 250.00, 250.01). 07/27/17   Vanessa Kick, MD  cetirizine (ZYRTEC) 10 MG tablet Take 10 mg by mouth daily as needed for allergies or rhinitis.    [provider]  fluticasone (FLONASE) 50 MCG/ACT nasal spray Place 2 sprays into both nostrils daily as needed for allergies or rhinitis. 12/04/19   [provider]  Insulin Glargine (LANTUS) 100 UNIT/ML Solostar Pen Inject 10 Units into the skin daily at 10 pm. Patient taking differently: Inject 20 Units into the skin at bedtime. 07/27/17   Vanessa Kick, MD  metFORMIN (GLUCOPHAGE) 1000 MG tablet Take 1,000 mg by mouth 2 (two) times daily. 06/25/19   [provider]  nitroGLYCERIN (NITROSTAT) 0.4 MG SL tablet Place 1  tablet (0.4 mg total) under the tongue every 5 (five) minutes x 3 doses as needed for chest pain. 03/10/21   Dorie Rank, MD  ondansetron (ZOFRAN-ODT) 4 MG disintegrating tablet Take 1 tablet (4 mg total) by mouth every 8 (eight) hours as needed for nausea or vomiting. 07/01/21   Lamptey, Myrene Galas, MD  pantoprazole (PROTONIX) 20 MG tablet Take 1 tablet (20 mg total) by mouth daily. 03/10/21   Dorie Rank, MD  rosuvastatin (CRESTOR) 40 MG tablet TAKE 1 TABLET(40 MG) BY MOUTH DAILY 09/01/21   Troy Sine, MD  Vitamin D, Ergocalciferol, (DRISDOL) 1.25 MG (50000 UNIT) CAPS capsule Take 50,000 Units by mouth every Monday. 12/20/19   [provider]    Family History Family History  Problem Relation Age of Onset   Cancer Mother    Heart attack Father    Diabetes Sister    Diabetes Sister    Diabetes Brother    Hypertension Brother  Hypertension Brother     Social History Social History   Tobacco Use   Smoking status: Former    Packs/day: 0.50    Years: 47.00    Total pack years: 23.50    Types: Cigarettes    Quit date: 09/06/2016    Years since quitting: 5.8   Smokeless tobacco: Never  Vaping Use   Vaping Use: Never used  Substance Use Topics   Alcohol use: Yes    Comment: social   Drug use: Not Currently    Types: Marijuana    Comment: 08/24/2016 "nothing since my younger days"     Allergies   Lisinopril and Ace inhibitors   Review of Systems Review of Systems  Constitutional:  Negative for fever.  HENT:  Positive for congestion and postnasal drip. Negative for ear discharge and ear pain.   Eyes:  Positive for photophobia, pain, discharge and redness.  Respiratory:  Negative for cough.   Musculoskeletal:  Negative for gait problem.  Hematological:  Negative for adenopathy.     Physical Exam Triage Vital Signs ED Triage Vitals  Enc Vitals Group     BP 07/05/22 1044 (!) 168/76     Pulse Rate 07/05/22 1044 77     Resp 07/05/22 1044 16     Temp 07/05/22  1044 97.8 F (36.6 C)     Temp Source 07/05/22 1044 Oral     SpO2 07/05/22 1044 96 %     Weight --      Height --      Head Circumference --      Peak Flow --      Pain Score 07/05/22 1045 8     Pain Loc --      Pain Edu? --      Excl. in [place]? --    No data found.  Updated Vital Signs BP (!) 168/76 (BP Location: Right Arm)   Pulse 77   Temp 97.8 F (36.6 C) (Oral)   Resp 16   SpO2 96%   Visual Acuity Right Eye Distance:   Left Eye Distance:   Bilateral Distance:    Right Eye Near:   Left Eye Near:    Bilateral Near:     Physical Exam Vitals and nursing note reviewed.  Constitutional:      General: He is not in acute distress.    Appearance: He is not toxic-appearing.  HENT:     Right Ear: External ear normal.     Left Ear: External ear normal.     Nose: Congestion present.     Comments: Green mucous seen on R side Eyes:     General: No scleral icterus.       Left eye: Discharge present.    Extraocular Movements: Extraocular movements intact.     Slit lamp exam:    Left eye: Corneal ulcer and photophobia present. No foreign body.      Comments: L sclera is injected, but does not have perilimbal erythema.  Has a circular uptake of about 45m x 235mon central cornea  Cardiovascular:     Rate and Rhythm: Normal rate and regular rhythm.  Pulmonary:     Effort: Pulmonary effort is normal.     Breath sounds: Normal breath sounds.  Musculoskeletal:        General: Normal range of motion.     Cervical back: Neck supple.  Lymphadenopathy:     Cervical: No cervical adenopathy.  Skin:    General: Skin is  warm and dry.  Neurological:     Mental Status: He is alert and oriented to person, place, and time.     Gait: Gait normal.  Psychiatric:        Mood and Affect: Mood normal.        Behavior: Behavior normal.        Thought Content: Thought content normal.        Judgment: Judgment normal.      UC Treatments / Results  Labs (all labs ordered are listed,  but only abnormal results are displayed) Labs Reviewed - No data to display  EKG   Radiology No results found.  Procedures Procedures (including critical care time)  Medications Ordered in UC Medications - No data to display  Initial Impression / Assessment and Plan / UC Course  I have reviewed the triage vital signs and the nursing notes.  L bacterial conjunctivitis L corneal ulcer Chronic sinusitis  I placed him on Augmentin, Polytrip eye gtts as noted Advised to FU with his eye MD tomorrow. See instructions.     Final Clinical Impressions(s) / UC Diagnoses   Final diagnoses:  Bacterial conjunctivitis of left eye  Chronic sinusitis, unspecified location     Discharge Instructions      Follow up with the eye doctor in the next 1-2 days Do not wear your contact until your eye is fully better. You need to see Ear Nose and Throat doctor due to your recurrent sinus infections.  You may take Tylenol as needed for pain.      ED Prescriptions     Medication Sig Dispense Auth. Provider   amoxicillin-clavulanate (AUGMENTIN) 875-125 MG tablet Take 1 tablet by mouth every 12 (twelve) hours. 20 tablet Rodriguez-Southworth, Sunday Spillers, PA-C   trimethoprim-polymyxin b (POLYTRIM) ophthalmic solution Place 1 drop into the left eye 3 (three) times daily. For 7 days 10 mL Rodriguez-Southworth, Sunday Spillers, PA-C      PDMP not reviewed this encounter.   Shelby Mattocks, [place] 07/05/22 1132

## 2022-07-05 NOTE — ED Triage Notes (Signed)
Patient c/o intermittent bilateral eye pain and sinus pain x 1-2 months. Patient states he is "blowing out green and brown ."  Patient states he has been taking Mucinex DM and the last dose was 5 days go.

## 2022-07-05 NOTE — Discharge Instructions (Addendum)
Follow up with the eye doctor in the next 1-2 days Do not wear your contact until your eye is fully better. You need to see Ear Nose and Throat doctor due to your recurrent sinus infections.  You may take Tylenol as needed for pain.

## 2022-10-27 ENCOUNTER — Ambulatory Visit: Payer: Medicare Other | Admitting: Cardiology

## 2022-11-24 ENCOUNTER — Encounter: Payer: Self-pay | Admitting: Cardiology

## 2022-11-24 ENCOUNTER — Ambulatory Visit: Payer: Medicare Other | Admitting: Cardiology

## 2022-11-24 VITALS — BP 105/66 | HR 77 | Resp 16 | Ht 69.0 in | Wt 181.0 lb

## 2022-11-24 DIAGNOSIS — E1165 Type 2 diabetes mellitus with hyperglycemia: Secondary | ICD-10-CM

## 2022-11-24 DIAGNOSIS — B20 Human immunodeficiency virus [HIV] disease: Secondary | ICD-10-CM

## 2022-11-24 DIAGNOSIS — I2510 Atherosclerotic heart disease of native coronary artery without angina pectoris: Secondary | ICD-10-CM

## 2022-11-24 DIAGNOSIS — I10 Essential (primary) hypertension: Secondary | ICD-10-CM

## 2022-11-24 DIAGNOSIS — E785 Hyperlipidemia, unspecified: Secondary | ICD-10-CM

## 2022-11-24 DIAGNOSIS — Z794 Long term (current) use of insulin: Secondary | ICD-10-CM

## 2022-11-24 DIAGNOSIS — Z955 Presence of coronary angioplasty implant and graft: Secondary | ICD-10-CM

## 2022-11-24 NOTE — Progress Notes (Signed)
ID:  Wayne Santos, DOB June 20, 1950, MRN [id number]  PCP:  Julieanne Manson, MD  Cardiologist:  Tessa Lerner, DO, [facility] (established care 02/24/2022) Former Cardiology Providers: Dr. Tresa Endo.   Date: 11/24/22 Last Office Visit:04/26/2022  Chief Complaint  Patient presents with   Coronary artery disease involving native coronary artery of   Follow-up    HPI  Wayne Santos is a 72 y.o. African-American male whose past medical history and cardiovascular risk factors include: Hypertension, insulin-dependent diabetes mellitus type 2, hyperlipidemia, HIV positive, CAD s/p PCI Lcx, anemia of chronic disease, advanced age, former smoker.   Referred to the practice to reestablish care from Dr. Landry Dyke practice given his history of CAD and prior PCI.  In 2018 he presented to the ED with unstable angina underwent angiography and was noted to have obstructive CAD in the LCx distribution which was felt to be the culprit.  He underwent stenting with 2.5 x 30 mm resolute Onyx drug-eluting stent and the remaining of his CAD was treated medically.  Since establishing care back in October 2023 he did undergo echo and stress test as outlined below.  He presents today for a 71-month follow-up visit.  He denies anginal chest pain or heart failure symptoms.  Overall functional capacity remains stable.  FUNCTIONAL STATUS: No structured exercise program or daily routine.  ALLERGIES: Allergies  Allergen Reactions   Lisinopril Other (See Comments) and Cough    Caused a terrible, persistent cough   Ace Inhibitors Cough    MEDICATION LIST PRIOR TO VISIT: Current Meds  Medication Sig   ACCU-CHEK GUIDE test strip 1 each by Other route 3 (three) times daily.   albuterol (VENTOLIN HFA) 108 (90 Base) MCG/ACT inhaler Inhale 2 puffs into the lungs 4 (four) times daily as needed for wheezing or shortness of breath.   amLODipine (NORVASC) 5 MG tablet Take 1 tablet (5 mg total) by mouth daily.   aspirin EC 81  MG EC tablet Take 1 tablet (81 mg total) by mouth daily.   B-D UF III MINI PEN NEEDLES 31G X 5 MM MISC Inject into the skin as directed.   bictegravir-emtricitabine-tenofovir AF (BIKTARVY) 50-200-25 MG TABS tablet Take 1 tablet by mouth daily.   blood glucose meter kit and supplies KIT Dispense based on patient and insurance preference. Use up to four times daily as directed. (FOR ICD-9 250.00, 250.01).   cetirizine (ZYRTEC) 10 MG tablet Take 10 mg by mouth daily as needed for allergies or rhinitis.   Insulin Glargine (LANTUS) 100 UNIT/ML Solostar Pen Inject 10 Units into the skin daily at 10 pm. (Patient taking differently: Inject 20 Units into the skin at bedtime.)   metFORMIN (GLUCOPHAGE) 1000 MG tablet Take 1,000 mg by mouth 2 (two) times daily.   nitroGLYCERIN (NITROSTAT) 0.4 MG SL tablet Place 1 tablet (0.4 mg total) under the tongue every 5 (five) minutes x 3 doses as needed for chest pain.   rosuvastatin (CRESTOR) 40 MG tablet TAKE 1 TABLET(40 MG) BY MOUTH DAILY   trimethoprim-polymyxin b (POLYTRIM) ophthalmic solution Place 1 drop into the left eye 3 (three) times daily. For 7 days   Vitamin D, Ergocalciferol, (DRISDOL) 1.25 MG (50000 UNIT) CAPS capsule Take 50,000 Units by mouth every Monday.     PAST MEDICAL HISTORY: Past Medical History:  Diagnosis Date   Anemia    AVN (avascular necrosis of bone) (HCC)    Benign paroxysmal positional vertigo 06/15/2015   CAD (coronary artery disease), native coronary artery  a. 08/24/16 NSTEMI/PCI: LM nl, LAD 40p, D1 60ost, D2 80ost-small, LCX 6m (2.5x30 ONyx DES), OM1/2 min irregs, OM3 nl, RCA 30p/m, RPDA/RPAV/RPL1/RPL2/RPL3 nl, EF 55-65%.   Childhood asthma    Dizziness 05/20/2020   Former smoker 12/28/2020   GERD (gastroesophageal reflux disease)    Glaucoma    History of pneumonia    "4 times in my life" (08/24/2016)   Hyperlipidemia    Hypertension    Memory impairment 06/15/2015   Normochromic normocytic anemia 12/16/2019   Osteoporosis     Poison ivy 12/26/2018   Primary localized osteoarthrosis of left hip 07/05/2016   Seasonal allergies    Tobacco abuse    Type 2 diabetes mellitus without complications (HCC) 12/22/2021    PAST SURGICAL HISTORY: Past Surgical History:  Procedure Laterality Date   CARDIAC CATHETERIZATION     CORONARY ANGIOPLASTY WITH STENT PLACEMENT  08/24/2016   CORONARY STENT INTERVENTION N/A 08/24/2016   Procedure: Coronary Stent Intervention;  Surgeon: Iran Ouch, MD;  Location: [facility] INVASIVE [facility];  Service: Cardiovascular;  Laterality: N/A;   HAND NERVE REPAIR Right 1990s?   "caught it in a machine; had to be reconstructed"   JOINT REPLACEMENT     LEFT HEART CATH AND CORONARY ANGIOGRAPHY N/A 08/24/2016   Procedure: Left Heart Cath and Coronary Angiography;  Surgeon: Iran Ouch, MD;  Location: [facility] INV[facility] CV [facility];  Service: Cardiovascular;  Laterality: N/A;   TOTAL HIP ARTHROPLASTY Right    TOTAL HIP ARTHROPLASTY Left 07/05/2016   Procedure: TOTAL HIP ARTHROPLASTY ANTERIOR APPROACH;  Surgeon: Marcene Corning, MD;  Location: [facility] OR;  Service: Orthopedics;  Laterality: Left;    FAMILY HISTORY: The patient family history includes Cancer in his mother; Diabetes in his brother, sister, and sister; Heart attack in his father; Hypertension in his brother and brother.  SOCIAL HISTORY:  The patient  reports that he quit smoking about 6 years ago. His smoking use included cigarettes. He started smoking about 53 years ago. He has a 23.5 pack-year smoking history. He has never used smokeless tobacco. He reports current alcohol use. He reports that he does not currently use drugs after having used the following drugs: Marijuana.  REVIEW OF SYSTEMS: Review of Systems  Cardiovascular:  Negative for chest pain, claudication, dyspnea on exertion, irregular heartbeat, leg swelling, near-syncope, orthopnea, palpitations, paroxysmal nocturnal dyspnea and syncope.  Respiratory:  Negative for shortness of breath.    Hematologic/Lymphatic: Negative for bleeding problem.  Musculoskeletal:  Negative for muscle cramps and myalgias.  Neurological:  Negative for dizziness and light-headedness.    PHYSICAL EXAM:    11/24/2022    1:52 PM 07/05/2022   10:44 AM 04/26/2022    9:03 AM  Vitals with BMI  Height 5\' 9"   5\' 9"   Weight 181 lbs  180 lbs 3 oz  BMI 26.72  26.6  Systolic 105 168 951  Diastolic 66 76 70  Pulse 77 77 80    Physical Exam  Constitutional: No distress.  Age appropriate, hemodynamically stable.   Neck: No JVD present.  Cardiovascular: Normal rate, regular rhythm, S1 normal, S2 normal, intact distal pulses and normal pulses. Exam reveals no gallop, no S3 and no S4.  No murmur heard. Pulmonary/Chest: Effort normal and breath sounds normal. No stridor. He has no wheezes. He has no rales.  Abdominal: Soft. Bowel sounds are normal. He exhibits no distension. There is no abdominal tenderness.  Musculoskeletal:        General: No edema.  Cervical back: Neck supple.  Neurological: He is alert and oriented to person, place, and time. He has intact cranial nerves (2-12).  Skin: Skin is warm and moist.   CARDIAC DATABASE: EKG: November 24, 2022: Sinus rhythm, 71 bpm, LVH per voltage criteria, nonspecific T wave abnormality.  Echocardiogram: 03/28/2022:  Normal LV systolic function with visual EF 50-55%. Left ventricle cavity is normal in size. Mild concentric hypertrophy of the left ventricle. Normal global wall motion. Normal diastolic filling pattern, normal LAP.  Structurally normal tricuspid valve with trace regurgitation. No evidence of pulmonary hypertension. No prior available for comparison.  Stress Testing: Exercise nuclear stress test 03/28/2022: Myocardial perfusion is normal. Overall LV systolic function is normal without regional wall motion abnormalities. Stress LV EF: 44%, visually appears to be at least lo normal.  Normal ECG stress. The patient exercised for 2 minutes  and 18 seconds of a Bruce protocol, achieving approximately 4.64 METs & 85% MPHR. Markedly reduced exercise tolerance. No chest pain. Stress terminated due to fatigue and THR achieved. The blood pressure response was normal. No previous exam available for comparison. Low risk study.    Heart Catheterization: 08/24/2016: ? Prox RCA lesion, 30 %stenosed. ? Mid RCA lesion, 30 %stenosed. ? Prox LAD lesion, 40 %stenosed. ? Ost 2nd Diag to 2nd Diag lesion, 80 %stenosed. ? Ost 1st Diag to 1st Diag lesion, 60 %stenosed. ? The left ventricular systolic function is normal. ? LV end diastolic pressure is normal. ? The left ventricular ejection fraction is 55-65% by visual estimate. ? A STENT RESOLUTE ONYX 2.5X30 drug eluting stent was successfully placed. ? Mid Cx lesion, 95 %stenosed. ? Post intervention, there is a 0% residual stenosis.  LABORATORY DATA:    Latest Ref Rng & Units 12/06/2021    3:54 PM 06/24/2021   12:11 PM 03/10/2021    6:51 PM  CBC  WBC 3.8 - 10.8 Thousand/uL 6.0  7.0  6.6   Hemoglobin 13.2 - 17.1 g/dL 8.7  9.3  9.1   Hematocrit 38.5 - 50.0 % 29.5  32.1  31.4   Platelets 140 - 400 Thousand/uL 281  367  295        Latest Ref Rng & Units 12/06/2021    3:54 PM 06/24/2021   12:11 PM 03/10/2021    6:51 PM  CMP  Glucose 65 - 99 mg/dL 784  696  295   BUN 7 - 25 mg/dL 9  12  13    Creatinine 0.70 - 1.28 mg/dL 2.84  1.32  4.40   Sodium 135 - 146 mmol/L 141  145  141   Potassium 3.5 - 5.3 mmol/L 3.6  3.8  3.1   Chloride 98 - 110 mmol/L 107  107  105   CO2 20 - 32 mmol/L 25  28  26    Calcium 8.6 - 10.3 mg/dL 10.2  72.5  9.4   Total Protein 6.1 - 8.1 g/dL 7.1  7.8    Total Bilirubin 0.2 - 1.2 mg/dL 0.3  0.4    AST 10 - 35 U/L 16  15    ALT 9 - 46 U/L 14  16      Lipid Panel     Component Value Date/Time   CHOL 86 12/06/2021 1554   TRIG 73 12/06/2021 1554   HDL 37 (L) 12/06/2021 1554   CHOLHDL 2.3 12/06/2021 1554   VLDL 25 08/24/2016 0648   LDLCALC 34 12/06/2021 1554    LDLDIRECT 113 (H) 06/05/2017 1624  No components found for: "NTPROBNP" No results for input(s): "PROBNP" in the last 8760 hours. No results for input(s): "TSH" in the last 8760 hours.  BMP Recent Labs    12/06/21 1554  NA 141  K 3.6  CL 107  CO2 25  GLUCOSE 205*  BUN 9  CREATININE 1.06  CALCIUM 10.0    HEMOGLOBIN A1C Lab Results  Component Value Date   HGBA1C 6.1 (H) 08/24/2016   MPG 128 08/24/2016   External Labs: Collected: 07/21/2021 provided by referring physician. Total cholesterol 90, triglycerides 71, HDL 36, LDL 40, non-HDL 54. Hemoglobin 8.6, hematocrit 29.8% Sodium 142, potassium 4, chloride 103, bicarb 27, BUN 13, creatinine 1.15. AST 16, ALT 14, alkaline phosphatase 88. TSH 0.97   IMPRESSION:    ICD-10-CM   1. Coronary artery disease involving native coronary artery of native heart without angina pectoris  I25.10 EKG 12-Lead    2. History of coronary angioplasty with insertion of stent  Z95.5     3. Essential hypertension  I10     4. Type 2 diabetes mellitus with hyperglycemia, with long-term current use of insulin (HCC)  E11.65    Z79.4     5. Long-term insulin use (HCC)  Z79.4     6. Hyperlipidemia with target LDL less than 70  E78.5     7. HIV disease (HCC)  B20        RECOMMENDATIONS: Wayne Santos is a 72 y.o. African-American male whose past medical history and cardiac risk factors include: Hypertension, insulin-dependent diabetes mellitus type 2, hyperlipidemia, HIV positive, CAD s/p PCI Lcx, anemia of chronic disease, advanced age, former smoker.   Coronary artery disease involving native coronary artery of native heart without angina pectoris History of coronary angioplasty with insertion of stent Denies anginal chest pain or heart failure symptoms since last office visit. No use of sublingual nitroglycerin tablets since the last office visit. EKG nonischemic. Prior echocardiogram notes low normal LVEF without any significant  valvular heart disease, see report for additional details. Prior exercise nuclear stress test was reported to be low risk. Reemphasized the importance of improving his modifiable cardiovascular risk factors and 30 minutes of moderate intensity exercise 5 days a week as tolerated. No additional testing warranted at this time.  Essential hypertension Office blood pressures are well-controlled. No changes warranted at this time.  Type 2 diabetes mellitus with hyperglycemia, with long-term current use of insulin (HCC) Reemphasized the importance of glycemic control. Currently on  statin therapy, metformin. As recommended before consider transitioning him from amlodipine to ARB in the setting of diabetes for renal protection properties.  Will defer to PCP. Consider the addition of SGLT2 inhibitors as well-defer to PCP  FINAL MEDICATION LIST END OF ENCOUNTER: No orders of the defined types were placed in this encounter.   Medications Discontinued During This Encounter  Medication Reason   amoxicillin-clavulanate (AUGMENTIN) 875-125 MG tablet    benzonatate (TESSALON) 100 MG capsule    ondansetron (ZOFRAN-ODT) 4 MG disintegrating tablet    fluticasone (FLONASE) 50 MCG/ACT nasal spray    pantoprazole (PROTONIX) 20 MG tablet      Current Outpatient Medications:    ACCU-CHEK GUIDE test strip, 1 each by Other route 3 (three) times daily., Disp: , Rfl:    albuterol (VENTOLIN HFA) 108 (90 Base) MCG/ACT inhaler, Inhale 2 puffs into the lungs 4 (four) times daily as needed for wheezing or shortness of breath., Disp: , Rfl:    amLODipine (NORVASC) 5 MG tablet, Take  1 tablet (5 mg total) by mouth daily., Disp: 30 tablet, Rfl: 1   aspirin EC 81 MG EC tablet, Take 1 tablet (81 mg total) by mouth daily., Disp: , Rfl:    B-D UF III MINI PEN NEEDLES 31G X 5 MM MISC, Inject into the skin as directed., Disp: , Rfl:    bictegravir-emtricitabine-tenofovir AF (BIKTARVY) 50-200-25 MG TABS tablet, Take 1 tablet  by mouth daily., Disp: 30 tablet, Rfl: 11   blood glucose meter kit and supplies KIT, Dispense based on patient and insurance preference. Use up to four times daily as directed. (FOR ICD-9 250.00, 250.01)., Disp: 1 each, Rfl: 0   cetirizine (ZYRTEC) 10 MG tablet, Take 10 mg by mouth daily as needed for allergies or rhinitis., Disp: , Rfl:    Insulin Glargine (LANTUS) 100 UNIT/ML Solostar Pen, Inject 10 Units into the skin daily at 10 pm. (Patient taking differently: Inject 20 Units into the skin at bedtime.), Disp: 15 mL, Rfl: 0   metFORMIN (GLUCOPHAGE) 1000 MG tablet, Take 1,000 mg by mouth 2 (two) times daily., Disp: , Rfl:    nitroGLYCERIN (NITROSTAT) 0.4 MG SL tablet, Place 1 tablet (0.4 mg total) under the tongue every 5 (five) minutes x 3 doses as needed for chest pain., Disp: 25 tablet, Rfl: 3   rosuvastatin (CRESTOR) 40 MG tablet, TAKE 1 TABLET(40 MG) BY MOUTH DAILY, Disp: 15 tablet, Rfl: 0   trimethoprim-polymyxin b (POLYTRIM) ophthalmic solution, Place 1 drop into the left eye 3 (three) times daily. For 7 days, Disp: 10 mL, Rfl: 0   Vitamin D, Ergocalciferol, (DRISDOL) 1.25 MG (50000 UNIT) CAPS capsule, Take 50,000 Units by mouth every Monday., Disp: , Rfl:   Orders Placed This Encounter  Procedures   EKG 12-Lead    There are no Patient Instructions on file for this visit.   --Continue cardiac medications as reconciled in final medication list. --Return in about 1 year (around 11/24/2023) for Follow up, CAD. or sooner if needed. --Continue follow-up with your primary care physician regarding the management of your other chronic comorbid conditions.  Patient's questions and concerns were addressed to his satisfaction. He voices understanding of the instructions provided during this encounter.   This note was created using a voice recognition software as a result there may be grammatical errors inadvertently enclosed that do not reflect the nature of this encounter. Every attempt is  made to correct such errors.  Tessa Lerner, [place], [facility]  Pager: [phone] Office: [phone]

## 2022-12-16 ENCOUNTER — Other Ambulatory Visit: Payer: Self-pay

## 2022-12-16 MED ORDER — BIKTARVY 50-200-25 MG PO TABS: 1.0000 | ORAL_TABLET | Freq: Every day | ORAL | 0 refills | Status: DC

## 2023-01-10 ENCOUNTER — Other Ambulatory Visit: Payer: Self-pay | Admitting: Infectious Disease

## 2023-01-11 ENCOUNTER — Other Ambulatory Visit: Payer: Self-pay

## 2023-01-11 ENCOUNTER — Telehealth: Payer: Self-pay

## 2023-01-11 ENCOUNTER — Telehealth: Payer: Self-pay | Admitting: Infectious Disease

## 2023-01-11 MED ORDER — BIKTARVY 50-200-25 MG PO TABS: 1.0000 | ORAL_TABLET | Freq: Every day | ORAL | 0 refills | Status: DC

## 2023-01-11 NOTE — Telephone Encounter (Signed)
Wayne Santos returned my call to schedule a follow-up with Dr. Daiva Eves. He also requested a refill for [facility] until he can see Daiva Eves. Patient refused a different provider to be seen sooner.

## 2023-01-11 NOTE — Telephone Encounter (Signed)
Attempted to call patient to schedule lab and office visit with Dr. Daiva Eves. I left a voicemail requesting a call back, no recent MyChart activity.

## 2023-01-11 NOTE — Telephone Encounter (Signed)
Pt due for appt 

## 2023-01-11 NOTE — Telephone Encounter (Signed)
Rx sent 

## 2023-02-08 ENCOUNTER — Other Ambulatory Visit: Payer: Self-pay

## 2023-02-08 DIAGNOSIS — B20 Human immunodeficiency virus [HIV] disease: Secondary | ICD-10-CM

## 2023-02-08 MED ORDER — BIKTARVY 50-200-25 MG PO TABS: 1.0000 | ORAL_TABLET | Freq: Every day | ORAL | 0 refills | Status: DC

## 2023-02-20 ENCOUNTER — Other Ambulatory Visit: Payer: Self-pay

## 2023-02-20 ENCOUNTER — Other Ambulatory Visit: Payer: Medicare Other

## 2023-02-20 DIAGNOSIS — B20 Human immunodeficiency virus [HIV] disease: Secondary | ICD-10-CM

## 2023-02-20 DIAGNOSIS — Z113 Encounter for screening for infections with a predominantly sexual mode of transmission: Secondary | ICD-10-CM

## 2023-02-20 DIAGNOSIS — Z79899 Other long term (current) drug therapy: Secondary | ICD-10-CM

## 2023-02-22 ENCOUNTER — Other Ambulatory Visit: Payer: Self-pay

## 2023-02-22 ENCOUNTER — Other Ambulatory Visit: Payer: Medicare Other

## 2023-02-22 DIAGNOSIS — Z113 Encounter for screening for infections with a predominantly sexual mode of transmission: Secondary | ICD-10-CM

## 2023-02-22 DIAGNOSIS — Z79899 Other long term (current) drug therapy: Secondary | ICD-10-CM

## 2023-02-22 DIAGNOSIS — B20 Human immunodeficiency virus [HIV] disease: Secondary | ICD-10-CM

## 2023-02-24 LAB — T-HELPER CELL (CD4) - (RCID CLINIC ONLY)
CD4 % Helper T Cell: 33 % (ref 33–65)
CD4 T Cell Abs: 479 /uL (ref 400–1790)

## 2023-02-26 LAB — CBC WITH DIFFERENTIAL/PLATELET
Absolute Lymphocytes: 1462 {cells}/uL (ref 850–3900)
Absolute Monocytes: 632 {cells}/uL (ref 200–950)
Basophils Absolute: 48 {cells}/uL (ref 0–200)
Basophils Relative: 0.7 %
Eosinophils Absolute: 313 {cells}/uL (ref 15–500)
Eosinophils Relative: 4.6 %
HCT: 29.4 % — ABNORMAL LOW (ref 38.5–50.0)
Hemoglobin: 8.4 g/dL — ABNORMAL LOW (ref 13.2–17.1)
MCH: 19 pg — ABNORMAL LOW (ref 27.0–33.0)
MCHC: 28.6 g/dL — ABNORMAL LOW (ref 32.0–36.0)
MCV: 66.4 fL — ABNORMAL LOW (ref 80.0–100.0)
MPV: 10.9 fL (ref 7.5–12.5)
Monocytes Relative: 9.3 %
Neutro Abs: 4345 {cells}/uL (ref 1500–7800)
Neutrophils Relative %: 63.9 %
Platelets: 296 10*3/uL (ref 140–400)
RBC: 4.43 10*6/uL (ref 4.20–5.80)
RDW: 18 % — ABNORMAL HIGH (ref 11.0–15.0)
Total Lymphocyte: 21.5 %
WBC: 6.8 10*3/uL (ref 3.8–10.8)

## 2023-02-26 LAB — LIPID PANEL
Cholesterol: 83 mg/dL (ref <200)
HDL: 39 mg/dL — ABNORMAL LOW (ref >=40)
LDL Cholesterol (Calc): 26 mg/dL
Non-HDL Cholesterol (Calc): 44 mg/dL (ref <130)
Total CHOL/HDL Ratio: 2.1 (calc) (ref <5.0)
Triglycerides: 92 mg/dL (ref <150)

## 2023-02-26 LAB — COMPLETE METABOLIC PANEL WITHOUT GFR
AG Ratio: 1.5 (calc) (ref 1.0–2.5)
ALT: 15 U/L (ref 9–46)
AST: 18 U/L (ref 10–35)
Albumin: 4.5 g/dL (ref 3.6–5.1)
Alkaline phosphatase (APISO): 65 U/L (ref 35–144)
BUN: 12 mg/dL (ref 7–25)
CO2: 26 mmol/L (ref 20–32)
Calcium: 9.6 mg/dL (ref 8.6–10.3)
Chloride: 103 mmol/L (ref 98–110)
Creat: 1.16 mg/dL (ref 0.70–1.28)
Globulin: 3 g/dL (ref 1.9–3.7)
Glucose, Bld: 100 mg/dL — ABNORMAL HIGH (ref 65–99)
Potassium: 3.5 mmol/L (ref 3.5–5.3)
Sodium: 139 mmol/L (ref 135–146)
Total Bilirubin: 0.4 mg/dL (ref 0.2–1.2)
Total Protein: 7.5 g/dL (ref 6.1–8.1)
eGFR: 67 mL/min/1.73m2 (ref >=60)

## 2023-02-26 LAB — RPR TITER: RPR Titer: 1:1 {titer} — ABNORMAL HIGH

## 2023-02-26 LAB — CBC MORPHOLOGY

## 2023-02-26 LAB — HIV-1 RNA QUANT-NO REFLEX-BLD
HIV 1 RNA Quant: NOT DETECTED {copies}/mL
HIV-1 RNA Quant, Log: NOT DETECTED {Log}

## 2023-02-26 LAB — RPR: RPR Ser Ql: REACTIVE — AB

## 2023-02-26 LAB — T PALLIDUM AB: T Pallidum Abs: POSITIVE — AB

## 2023-03-06 ENCOUNTER — Encounter: Payer: Self-pay | Admitting: Infectious Disease

## 2023-03-06 ENCOUNTER — Other Ambulatory Visit: Payer: Self-pay

## 2023-03-06 ENCOUNTER — Ambulatory Visit: Payer: Medicare Other | Admitting: Infectious Disease

## 2023-03-06 VITALS — BP 136/70 | HR 92 | Resp 16 | Ht 69.0 in | Wt 174.0 lb

## 2023-03-06 DIAGNOSIS — Z87891 Personal history of nicotine dependence: Secondary | ICD-10-CM

## 2023-03-06 DIAGNOSIS — Z7185 Encounter for immunization safety counseling: Secondary | ICD-10-CM | POA: Diagnosis not present

## 2023-03-06 DIAGNOSIS — E119 Type 2 diabetes mellitus without complications: Secondary | ICD-10-CM | POA: Diagnosis not present

## 2023-03-06 DIAGNOSIS — B20 Human immunodeficiency virus [HIV] disease: Secondary | ICD-10-CM

## 2023-03-06 DIAGNOSIS — I10 Essential (primary) hypertension: Secondary | ICD-10-CM

## 2023-03-06 DIAGNOSIS — E785 Hyperlipidemia, unspecified: Secondary | ICD-10-CM | POA: Diagnosis not present

## 2023-03-06 DIAGNOSIS — Z794 Long term (current) use of insulin: Secondary | ICD-10-CM

## 2023-03-06 DIAGNOSIS — I2510 Atherosclerotic heart disease of native coronary artery without angina pectoris: Secondary | ICD-10-CM

## 2023-03-06 DIAGNOSIS — R0609 Other forms of dyspnea: Secondary | ICD-10-CM | POA: Insufficient documentation

## 2023-03-06 HISTORY — DX: Other forms of dyspnea: R06.09

## 2023-03-06 MED ORDER — BIKTARVY 50-200-25 MG PO TABS
1.0000 | ORAL_TABLET | Freq: Every day | ORAL | 11 refills | Status: DC
Start: 2023-03-06 — End: 2024-01-17

## 2023-03-06 NOTE — Progress Notes (Signed)
Chief complaint: Complaint follow-up for HIV disease on medications s   Patient ID: Wayne Santos, male    DOB: December 12, 1950, 72 y.o.   MRN: [id number]  HPI  Wayne Santos is a 72 y.o man who is HIV has been perfectly controlled on SYMTUZA and more recently BIKTARVY.  He does have known coronary artery disease status post placement of stents, insulin-dependent diabetes mellitus on Lantus and metformin, pretension and hyperlipidemia who returns for routine follow-up care here.  He has been frustrated that several issues he has been having have not been investigated thoroughly by his primary care physician.  In particular he has been suffering from dyspnea on exertion for many months now but feels that this has not been worked up.  I told him I am happy to refer him to pulmonary and actually I think cardiology would be an important group to refer him to.  He was seen by [facility] cardiology but apparently the cardiologist that he was seeing is no longer practicing and I believe the practice itself is closing down.  When he was seen by them he was noted to have no symptoms concerning for heart failure.  Now however he has fairly regular dyspnea when he does any types of exertion.       Past Medical History:  Diagnosis Date   Anemia    AVN (avascular necrosis of bone) (HCC)    Benign paroxysmal positional vertigo 06/15/2015   CAD (coronary artery disease), native coronary artery    a. 08/24/16 NSTEMI/PCI: LM nl, LAD 40p, D1 60ost, D2 80ost-small, LCX 34m (2.5x30 ONyx DES), OM1/2 min irregs, OM3 nl, RCA 30p/m, RPDA/RPAV/RPL1/RPL2/RPL3 nl, EF 55-65%.   Childhood asthma    Dizziness 05/20/2020   Former smoker 12/28/2020   GERD (gastroesophageal reflux disease)    Glaucoma    History of pneumonia    "4 times in my life" (08/24/2016)   Hyperlipidemia    Hypertension    Memory impairment 06/15/2015   Normochromic normocytic anemia 12/16/2019   Osteoporosis    Poison ivy 12/26/2018   Primary  localized osteoarthrosis of left hip 07/05/2016   Seasonal allergies    Tobacco abuse    Type 2 diabetes mellitus without complications (HCC) 12/22/2021    Past Surgical History:  Procedure Laterality Date   CARDIAC CATHETERIZATION     CORONARY ANGIOPLASTY WITH STENT PLACEMENT  08/24/2016   CORONARY STENT INTERVENTION N/A 08/24/2016   Procedure: Coronary Stent Intervention;  Surgeon: Iran Ouch, MD;  Location: [facility];  Service: Cardiovascular;  Laterality: N/A;   HAND NERVE REPAIR Right 1990s?   "caught it in a machine; had to be reconstructed"   JOINT REPLACEMENT     LEFT HEART CATH AND CORONARY ANGIOGRAPHY N/A 08/24/2016   Procedure: Left Heart Cath and Coronary Angiography;  Surgeon: Iran Ouch, MD;  Location: [facility];  Service: Cardiovascular;  Laterality: N/A;   TOTAL HIP ARTHROPLASTY Right    TOTAL HIP ARTHROPLASTY Left 07/05/2016   Procedure: TOTAL HIP ARTHROPLASTY ANTERIOR APPROACH;  Surgeon: Marcene Corning, MD;  Location: [facility];  Service: Orthopedics;  Laterality: Left;    Family History  Problem Relation Age of Onset   Cancer Mother    Heart attack Father    Diabetes Sister    Diabetes Sister    Diabetes Brother    Hypertension Brother    Hypertension Brother       Social History   Socioeconomic History   Marital status:  Single    Spouse name: Not on file   Number of children: Not on file   Years of education: Not on file   Highest education level: Not on file  Occupational History   Not on file  Tobacco Use   Smoking status: Former    Current packs/day: 0.00    Average packs/day: 0.5 packs/day for 47.0 years (23.5 ttl pk-yrs)    Types: Cigarettes    Start date: 09/06/1969    Quit date: 09/06/2016    Years since quitting: 6.4   Smokeless tobacco: Never  Vaping Use   Vaping status: Never Used  Substance and Sexual Activity   Alcohol use: Yes    Comment: social   Drug use: Not Currently    Types: Marijuana    Comment:  08/24/2016 "nothing since my younger days"   Sexual activity: Not Currently    Comment: declined condoms.   Other Topics Concern   Not on file  Social History Narrative   Not on file   Social Determinants of Health   Financial Resource Strain: Not on file  Food Insecurity: Not on file  Transportation Needs: Not on file  Physical Activity: Not on file  Stress: Not on file  Social Connections: Not on file    Allergies  Allergen Reactions   Lisinopril Other (See Comments) and Cough    Caused a terrible, persistent cough   Ace Inhibitors Cough     Current Outpatient Medications:    ACCU-CHEK GUIDE test strip, 1 each by Other route 3 (three) times daily., Disp: , Rfl:    albuterol (VENTOLIN HFA) 108 (90 Base) MCG/ACT inhaler, Inhale 2 puffs into the lungs 4 (four) times daily as needed for wheezing or shortness of breath., Disp: , Rfl:    amLODipine (NORVASC) 5 MG tablet, Take 1 tablet (5 mg total) by mouth daily., Disp: 30 tablet, Rfl: 1   aspirin EC 81 MG EC tablet, Take 1 tablet (81 mg total) by mouth daily., Disp: , Rfl:    B-D UF III MINI PEN NEEDLES 31G X 5 MM MISC, Inject into the skin as directed., Disp: , Rfl:    bictegravir-emtricitabine-tenofovir AF (BIKTARVY) 50-200-25 MG TABS tablet, Take 1 tablet by mouth daily., Disp: 30 tablet, Rfl: 0   blood glucose meter kit and supplies KIT, Dispense based on patient and insurance preference. Use up to four times daily as directed. (FOR ICD-9 250.00, 250.01)., Disp: 1 each, Rfl: 0   cetirizine (ZYRTEC) 10 MG tablet, Take 10 mg by mouth daily as needed for allergies or rhinitis., Disp: , Rfl:    Insulin Glargine (LANTUS) 100 UNIT/ML Solostar Pen, Inject 10 Units into the skin daily at 10 pm. (Patient taking differently: Inject 20 Units into the skin at bedtime.), Disp: 15 mL, Rfl: 0   metFORMIN (GLUCOPHAGE) 1000 MG tablet, Take 1,000 mg by mouth 2 (two) times daily., Disp: , Rfl:    nitroGLYCERIN (NITROSTAT) 0.4 MG SL tablet, Place 1  tablet (0.4 mg total) under the tongue every 5 (five) minutes x 3 doses as needed for chest pain., Disp: 25 tablet, Rfl: 3   rosuvastatin (CRESTOR) 40 MG tablet, TAKE 1 TABLET(40 MG) BY MOUTH DAILY, Disp: 15 tablet, Rfl: 0   trimethoprim-polymyxin b (POLYTRIM) ophthalmic solution, Place 1 drop into the left eye 3 (three) times daily. For 7 days, Disp: 10 mL, Rfl: 0   Vitamin D, Ergocalciferol, (DRISDOL) 1.25 MG (50000 UNIT) CAPS capsule, Take 50,000 Units by mouth every Monday., Disp: ,  Rfl:      Review of Systems  Constitutional:  Negative for activity change, appetite change, chills, diaphoresis, fatigue, fever and unexpected weight change.  HENT:  Negative for congestion, rhinorrhea, sinus pressure, sneezing, sore throat and trouble swallowing.   Eyes:  Negative for photophobia and visual disturbance.  Respiratory:  Negative for cough, chest tightness, shortness of breath, wheezing and stridor.   Cardiovascular:  Negative for chest pain, palpitations and leg swelling.  Gastrointestinal:  Negative for abdominal distention, abdominal pain, anal bleeding, blood in stool, constipation, diarrhea, nausea and vomiting.  Genitourinary:  Negative for difficulty urinating, dysuria, flank pain and hematuria.  Musculoskeletal:  Negative for arthralgias, back pain, gait problem, joint swelling and myalgias.  Skin:  Negative for color change, pallor, rash and wound.  Neurological:  Negative for dizziness, tremors, weakness and light-headedness.  Hematological:  Negative for adenopathy. Does not bruise/bleed easily.  Psychiatric/Behavioral:  Negative for agitation, behavioral problems, confusion, decreased concentration, dysphoric mood and sleep disturbance.        Objective:   Physical Exam Constitutional:      Appearance: He is well-developed.  HENT:     Head: Normocephalic and atraumatic.  Eyes:     Conjunctiva/sclera: Conjunctivae normal.  Cardiovascular:     Rate and Rhythm: Normal rate and  regular rhythm.     Heart sounds: No murmur heard.    No friction rub. No gallop.  Pulmonary:     Effort: Pulmonary effort is normal. No respiratory distress.     Breath sounds: No stridor. No wheezing or rhonchi.  Abdominal:     General: There is no distension.     Palpations: Abdomen is soft.  Musculoskeletal:        General: No tenderness. Normal range of motion.     Cervical back: Normal range of motion and neck supple.  Skin:    General: Skin is warm and dry.     Coloration: Skin is not pale.     Findings: No erythema or rash.  Neurological:     General: No focal deficit present.     Mental Status: He is alert and oriented to person, place, and time.  Psychiatric:        Mood and Affect: Mood normal.        Behavior: Behavior normal.        Thought Content: Thought content normal.        Judgment: Judgment normal.          Assessment & Plan:     HIV disease:  I have reviewed Lonnel Jared Boord's labs including viral load which was  Lab Results  Component Value Date   HIV1RNAQUANT Not Detected 02/22/2023   and cd4 which was  Lab Results  Component Value Date   CD4TABS 479 02/22/2023     I am continuing patient's prescription for Biktarvy  Hypertension: He is continuing on amlodipine  Coronary artery disease he will continue on his Crestor his aspirin and his amlodipine  Diabetes mellitus he will continue on his Lantus and and metformin though I wonder if he would benefit from Ozempic or similar agent for weight loss to cure his diabetes.  Dyspnea on exertion: To me this sounds more suspicious for cardiogenic cause in particular patient has known coronary disease but he is also a former smoker I have made referrals to [facility] and cardiology and also to [place] pulmonary  Hyperlipidemia will continue on Crestor  Vaccine counseling he has received his most recent  COVID and flu vaccinations   I have personally spent 40 minutes involved in face-to-face and  non-face-to-face activities for this patient on the day of the visit. Professional time spent includes the following activities: Preparing to see the patient (review of tests), Obtaining and/or reviewing separately obtained history (admission/discharge record), Performing a medically appropriate examination and/or evaluation , Ordering medications/tests/procedures, referring and communicating with other health care professionals, Documenting clinical information in the EMR, Independently interpreting results (not separately reported), Communicating results to the patient/family/caregiver, Counseling and educating the patient/family/caregiver and Care coordination (not separately reported).

## 2023-03-11 ENCOUNTER — Ambulatory Visit (HOSPITAL_COMMUNITY)
Admission: EM | Admit: 2023-03-11 | Discharge: 2023-03-11 | Disposition: A | Discharge status: Home / Self Care | Payer: Medicare Other | Attending: Emergency Medicine | Admitting: Emergency Medicine

## 2023-03-11 ENCOUNTER — Encounter (HOSPITAL_COMMUNITY): Payer: Self-pay

## 2023-03-11 DIAGNOSIS — S0502XA Injury of conjunctiva and corneal abrasion without foreign body, left eye, initial encounter: Secondary | ICD-10-CM

## 2023-03-11 MED ORDER — TETRACAINE HCL 0.5 % OP SOLN
OPHTHALMIC | Status: AC
  Filled 2023-03-11: qty 4

## 2023-03-11 MED ORDER — EYE WASH OP SOLN
OPHTHALMIC | Status: AC
  Filled 2023-03-11: qty 118

## 2023-03-11 MED ORDER — CIPROFLOXACIN HCL 0.3 % OP OINT
TOPICAL_OINTMENT | OPHTHALMIC | 0 refills | Status: AC
Start: 2023-03-11 — End: ?

## 2023-03-11 MED ORDER — FLUORESCEIN SODIUM 1 MG OP STRP
ORAL_STRIP | OPHTHALMIC | Status: AC
  Filled 2023-03-11: qty 1

## 2023-03-11 NOTE — ED Triage Notes (Addendum)
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 Patient states he is unable to get his contacts out of both eyes.

## 2023-03-11 NOTE — Discharge Instructions (Addendum)
Apply ointment to the eye 4 times a day for 5 days. Follow-up with ophthalmologist if no improvement or continued pain. Do not wear contact lenses for the next 7 days.

## 2023-03-11 NOTE — ED Provider Notes (Signed)
[facility]    CSN: [id number] Arrival date & time: 03/11/23  1259      History   Chief Complaint Chief Complaint  Patient presents with   Eye Problem    HPI Wayne Santos is a 72 y.o. male.   Patient is reporting feeling something pop in his left eye yesterday doing yard work.  Wayne Santos states that Wayne Santos could not get his contact lens out.  Has a sensation of grit or a palpable.  The history is provided by the patient.  Eye Problem Associated symptoms: redness     Past Medical History:  Diagnosis Date   Anemia    AVN (avascular necrosis of bone) (HCC)    Benign paroxysmal positional vertigo 06/15/2015   CAD (coronary artery disease), native coronary artery    a. 08/24/16 NSTEMI/PCI: LM nl, LAD 40p, D1 60ost, D2 80ost-small, LCX 11m (2.5x30 ONyx DES), OM1/2 min irregs, OM3 nl, RCA 30p/m, RPDA/RPAV/RPL1/RPL2/RPL3 nl, EF 55-65%.   Childhood asthma    Dizziness 05/20/2020   Dyspnea on exertion 03/06/2023   Former smoker 12/28/2020   GERD (gastroesophageal reflux disease)    Glaucoma    History of pneumonia    "4 times in my life" (08/24/2016)   Hyperlipidemia    Hypertension    Memory impairment 06/15/2015   Normochromic normocytic anemia 12/16/2019   Osteoporosis    Poison ivy 12/26/2018   Primary localized osteoarthrosis of left hip 07/05/2016   Seasonal allergies    Tobacco abuse    Type 2 diabetes mellitus without complications (HCC) 12/22/2021    Patient Active Problem List   Diagnosis Date Noted   Dyspnea on exertion 03/06/2023   Type 2 diabetes mellitus without complications (HCC) 12/22/2021   Former smoker 12/28/2020   Dizziness 05/20/2020   Normochromic normocytic anemia 12/16/2019   Poison ivy 12/26/2018   CAD (coronary artery disease), native coronary artery 01/11/2017   NSTEMI (non-ST elevated myocardial infarction) (HCC) 08/24/2016   Primary localized osteoarthrosis of left hip 07/05/2016   Primary osteoarthritis of left hip 07/05/2016    Encounter for long-term (current) use of medications 05/18/2016   Routine screening for STI (sexually transmitted infection) 05/18/2016   Benign paroxysmal positional vertigo 06/15/2015   Memory impairment 06/15/2015   Benign essential HTN 06/02/2014   Type 1 diabetes mellitus with hyperglycemia (HCC) 02/04/2013   AVN (avascular necrosis of bone) (HCC)    Glaucoma    Hyperlipidemia 03/16/2011   ONYCHOMYCOSIS, TOENAILS 03/24/2010   MEMORY LOSS 12/21/2009   POSTOPERATIVE INFECTION 07/10/2008   Sinusitis, chronic 12/13/2007   URINARY TRACT INFECTION SITE NOT SPECIFIED 04/04/2007   BOILS, RECURRENT 11/14/2006   HIV disease (HCC) 09/12/2006   PANIC ATTACK 09/12/2006   Essential hypertension 09/12/2006   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   "caught it in a machine; had to be reconstructed"   JOINT REPLACEMENT     LEFT HEART CATH AND CORONARY ANGIOGRAPHY N/A 08/24/2016   Procedure: Left Heart Cath and Coronary Angiography;  Surgeon: Iran Ouch, MD;  Location: [facility];  Service: Cardiovascular;  Laterality: N/A;   TOTAL HIP ARTHROPLASTY Right  TOTAL HIP ARTHROPLASTY Left 07/05/2016   Procedure: TOTAL HIP ARTHROPLASTY ANTERIOR APPROACH;  Surgeon: Marcene Corning, MD;  Location: [facility];  Service: Orthopedics;  Laterality: Left;       Home Medications    Prior to Admission medications   Medication Sig Start Date End Date Taking? Authorizing Provider  ciprofloxacin (CILOXAN) 0.3 % ophthalmic ointment Apply a small half-inch ribbon to left eye 4 times a day for 5 days 03/11/23  Yes Juanjose Mojica, Linde Gillis, NP   ACCU-CHEK GUIDE test strip 1 each by Other route 3 (three) times daily. 11/16/20   [provider]  albuterol (VENTOLIN HFA) 108 (90 Base) MCG/ACT inhaler Inhale 2 puffs into the lungs 4 (four) times daily as needed for wheezing or shortness of breath. 05/30/20   [provider]  amLODipine (NORVASC) 5 MG tablet Take 1 tablet (5 mg total) by mouth daily. 11/18/20   Randall Hiss, MD  aspirin EC 81 MG EC tablet Take 1 tablet (81 mg total) by mouth daily. 08/25/16   Arty Baumgartner, NP  B-D UF III MINI PEN NEEDLES 31G X 5 MM MISC Inject into the skin as directed. 09/25/20   [provider]  bictegravir-emtricitabine-tenofovir AF (BIKTARVY) 50-200-25 MG TABS tablet Take 1 tablet by mouth daily. 03/06/23   Randall Hiss, MD  blood glucose meter kit and supplies KIT Dispense based on patient and insurance preference. Use up to four times daily as directed. (FOR ICD-9 250.00, 250.01). 07/27/17   Mardella Layman, MD  cetirizine (ZYRTEC) 10 MG tablet Take 10 mg by mouth daily as needed for allergies or rhinitis.    [provider]  Insulin Glargine (LANTUS) 100 UNIT/ML Solostar Pen Inject 10 Units into the skin daily at 10 pm. Patient taking differently: Inject 20 Units into the skin at bedtime. 07/27/17   Mardella Layman, MD  metFORMIN (GLUCOPHAGE) 1000 MG tablet Take 1,000 mg by mouth 2 (two) times daily. 06/25/19   [provider]  nitroGLYCERIN (NITROSTAT) 0.4 MG SL tablet Place 1 tablet (0.4 mg total) under the tongue every 5 (five) minutes x 3 doses as needed for chest pain. 03/10/21   Linwood Dibbles, MD  rosuvastatin (CRESTOR) 40 MG tablet TAKE 1 TABLET(40 MG) BY MOUTH DAILY 09/01/21   Lennette Bihari, MD  trimethoprim-polymyxin b (POLYTRIM) ophthalmic solution Place 1 drop into the left eye 3 (three) times daily. For 7 days 07/05/22   Rodriguez-Southworth, Nettie Elm, PA-C  Vitamin D, Ergocalciferol, (DRISDOL) 1.25 MG (50000 UNIT) CAPS capsule Take 50,000 Units  by mouth every Monday. 12/20/19   [provider]    Family History Family History  Problem Relation Age of Onset   Cancer Mother    Heart attack Father    Diabetes Sister    Diabetes Sister    Diabetes Brother    Hypertension Brother    Hypertension Brother     Social History Social History   Tobacco Use   Smoking status: Former    Current packs/day: 0.00    Average packs/day: 0.5 packs/day for 47.0 years (23.5 ttl pk-yrs)    Types: Cigarettes    Start date: 09/06/1969    Quit date: 09/06/2016    Years since quitting: 6.5   Smokeless tobacco: Never  Vaping Use   Vaping status: Never Used  Substance Use Topics   Alcohol use: Yes    Comment: social   Drug use: Not Currently    Types: Marijuana    Comment: 08/24/2016 "nothing  since my younger days"     Allergies   Lisinopril and Ace inhibitors   Review of Systems Review of Systems  Eyes:  Positive for pain and redness.  All other systems reviewed and are negative.    Physical Exam Triage Vital Signs ED Triage Vitals  Encounter Vitals Group     BP 03/11/23 1454 (!) 161/68     Systolic BP Percentile --      Diastolic BP Percentile --      Pulse Rate 03/11/23 1454 80     Resp 03/11/23 1454 14     Temp 03/11/23 1454 98.1 F (36.7 C)     Temp src --      SpO2 03/11/23 1454 97 %     Weight --      Height --      Head Circumference --      Peak Flow --      Pain Score 03/11/23 1456 3     Pain Loc --      Pain Education --      Exclude from Growth Chart --    No data found.  Updated Vital Signs BP (!) 161/68 (BP Location: Left Arm)   Pulse 80   Temp 98.1 F (36.7 C)   Resp 14   SpO2 97%   Visual Acuity Right Eye Distance:   Left Eye Distance:   Bilateral Distance:    Right Eye Near:   Left Eye Near:    Bilateral Near:     Physical Exam Vitals and nursing note reviewed.  Eyes:     General:        Left eye: Discharge present.    Comments: On exam no contact lens noted.  His lens do  give him the appearance of bilateral eyes when Wayne Santos is naturally brown eyed.  Fluorescein applied and a small corneal abrasion noted at the 9 o'clock position.  Neurological:     Mental Status: Wayne Santos is alert.      UC Treatments / Results  Labs (all labs ordered are listed, but only abnormal results are displayed) Labs Reviewed - No data to display  EKG   Radiology No results found.  Procedures Procedures (including critical care time)  Medications Ordered in UC Medications - No data to display  Initial Impression / Assessment and Plan / UC Course  I have reviewed the triage vital signs and the nursing notes.  Pertinent labs & imaging results that were available during my care of the patient were reviewed by me and considered in my medical decision making (see chart for details).   Patient is reporting pain in the left eye.  Wayne Santos was doing yard work yesterday and feels that Wayne Santos got something in his eye.  Wayne Santos also states that Wayne Santos was unable to get his contact lens out.  On exam no contact lens noted.  His lens do give him the appearance of blue eyes when Wayne Santos is naturally brown eyed.  Fluorescein applied and a small corneal abrasion noted at the 9 o'clock position.  Ciprofloxacin ointment 4 times daily d/t contact wearing.  F/u with opthalmology if symptoms do not improve   Final Clinical Impressions(s) / UC Diagnoses   Final diagnoses:  Abrasion of left cornea, initial encounter     Discharge Instructions      Apply ointment to the eye 4 times a day for 5 days. Follow-up with ophthalmologist if no improvement or continued pain. Do not wear contact lenses for  the next 7 days.     ED Prescriptions     Medication Sig Dispense Auth. Provider   ciprofloxacin (CILOXAN) 0.3 % ophthalmic ointment Apply a small half-inch ribbon to left eye 4 times a day for 5 days 3.5 g Robb Sibal, Linde Gillis, NP      PDMP not reviewed this encounter.   Nelda Marseille, NP 03/11/23 [phone]

## 2023-10-06 NOTE — Progress Notes (Signed)
 The ASCVD Risk score (Arnett DK, et al., 2019) failed to calculate for the following reasons:   Risk score cannot be calculated because patient has a medical history suggesting prior/existing ASCVD  Arlon Bergamo, BSN, RN

## 2023-11-24 ENCOUNTER — Ambulatory Visit: Payer: Self-pay | Admitting: Cardiology

## 2023-12-27 ENCOUNTER — Other Ambulatory Visit: Payer: Medicare Other

## 2023-12-27 ENCOUNTER — Other Ambulatory Visit (HOSPITAL_COMMUNITY)
Admission: RE | Admit: 2023-12-27 | Discharge: 2023-12-27 | Disposition: A | Discharge status: Home / Self Care | Source: Ambulatory Visit | Attending: Infectious Disease | Admitting: Infectious Disease

## 2023-12-27 ENCOUNTER — Other Ambulatory Visit: Payer: Self-pay

## 2023-12-27 DIAGNOSIS — E119 Type 2 diabetes mellitus without complications: Secondary | ICD-10-CM | POA: Diagnosis present

## 2023-12-27 DIAGNOSIS — E785 Hyperlipidemia, unspecified: Secondary | ICD-10-CM | POA: Insufficient documentation

## 2023-12-27 DIAGNOSIS — Z7185 Encounter for immunization safety counseling: Secondary | ICD-10-CM | POA: Insufficient documentation

## 2023-12-27 DIAGNOSIS — Z794 Long term (current) use of insulin: Secondary | ICD-10-CM | POA: Insufficient documentation

## 2023-12-27 DIAGNOSIS — I10 Essential (primary) hypertension: Secondary | ICD-10-CM | POA: Insufficient documentation

## 2023-12-27 DIAGNOSIS — B20 Human immunodeficiency virus [HIV] disease: Secondary | ICD-10-CM | POA: Diagnosis present

## 2023-12-28 ENCOUNTER — Ambulatory Visit: Payer: Self-pay

## 2023-12-28 LAB — T-HELPER CELL (CD4) - (RCID CLINIC ONLY)
CD4 % Helper T Cell: 35 % (ref 33–65)
CD4 T Cell Abs: 539 /uL (ref 400–1790)

## 2023-12-28 NOTE — Telephone Encounter (Signed)
 Spoke with patient regarding results. No questions at this time. Understands to increase fluids and stay hydrated. Lorenda CHRISTELLA Code, RMA

## 2023-12-28 NOTE — Telephone Encounter (Signed)
-----   Message from [place] sent at 12/28/2023 11:38 AM EDT ----- Regarding: FW: His creatine is up a bit. Would make sure he stays hydrated and we can recheck at his visit e me ----- Message ----- From: Interface, [facility] Results In Sent: 12/28/2023   1:55 AM EDT To: Jomarie LOISE Fleeta Kathie, MD

## 2023-12-29 LAB — CBC WITH DIFFERENTIAL/PLATELET
Absolute Lymphocytes: 1558 {cells}/uL (ref 850–3900)
Absolute Monocytes: 398 {cells}/uL (ref 200–950)
Basophils Absolute: 42 {cells}/uL (ref 0–200)
Basophils Relative: 0.8 %
Eosinophils Absolute: 307 {cells}/uL (ref 15–500)
Eosinophils Relative: 5.8 %
HCT: 33.7 % — ABNORMAL LOW (ref 38.5–50.0)
Hemoglobin: 10.8 g/dL — ABNORMAL LOW (ref 13.2–17.1)
MCH: 30 pg (ref 27.0–33.0)
MCHC: 32 g/dL (ref 32.0–36.0)
MCV: 93.6 fL (ref 80.0–100.0)
MPV: 10.6 fL (ref 7.5–12.5)
Monocytes Relative: 7.5 %
Neutro Abs: 2995 {cells}/uL (ref 1500–7800)
Neutrophils Relative %: 56.5 %
Platelets: 208 Thousand/uL (ref 140–400)
RBC: 3.6 Million/uL — ABNORMAL LOW (ref 4.20–5.80)
RDW: 12.4 % (ref 11.0–15.0)
Total Lymphocyte: 29.4 %
WBC: 5.3 Thousand/uL (ref 3.8–10.8)

## 2023-12-29 LAB — COMPLETE METABOLIC PANEL WITHOUT GFR
AG Ratio: 1.7 (calc) (ref 1.0–2.5)
ALT: 12 U/L (ref 9–46)
AST: 15 U/L (ref 10–35)
Albumin: 4.4 g/dL (ref 3.6–5.1)
Alkaline phosphatase (APISO): 63 U/L (ref 35–144)
BUN/Creatinine Ratio: 10 (calc) (ref 6–22)
BUN: 15 mg/dL (ref 7–25)
CO2: 26 mmol/L (ref 20–32)
Calcium: 9.9 mg/dL (ref 8.6–10.3)
Chloride: 106 mmol/L (ref 98–110)
Creat: 1.55 mg/dL — ABNORMAL HIGH (ref 0.70–1.28)
Globulin: 2.6 g/dL (ref 1.9–3.7)
Glucose, Bld: 184 mg/dL — ABNORMAL HIGH (ref 65–99)
Potassium: 3.5 mmol/L (ref 3.5–5.3)
Sodium: 142 mmol/L (ref 135–146)
Total Bilirubin: 0.3 mg/dL (ref 0.2–1.2)
Total Protein: 7 g/dL (ref 6.1–8.1)

## 2023-12-29 LAB — LIPID PANEL
Cholesterol: 84 mg/dL (ref <200)
HDL: 40 mg/dL (ref >=40)
LDL Cholesterol (Calc): 27 mg/dL
Non-HDL Cholesterol (Calc): 44 mg/dL (ref <130)
Total CHOL/HDL Ratio: 2.1 (calc) (ref <5.0)
Triglycerides: 87 mg/dL (ref <150)

## 2023-12-29 LAB — URINE CYTOLOGY ANCILLARY ONLY
Chlamydia: NEGATIVE
Comment: NEGATIVE
Comment: NORMAL
Neisseria Gonorrhea: NEGATIVE

## 2023-12-29 LAB — HEPATITIS C ANTIBODY: Hepatitis C Ab: NONREACTIVE

## 2023-12-29 LAB — HIV-1 RNA QUANT-NO REFLEX-BLD
HIV 1 RNA Quant: NOT DETECTED {copies}/mL
HIV-1 RNA Quant, Log: NOT DETECTED {Log_copies}/mL

## 2023-12-29 LAB — RPR: RPR Ser Ql: NONREACTIVE

## 2024-01-09 ENCOUNTER — Encounter: Payer: Self-pay | Admitting: Infectious Disease

## 2024-01-09 DIAGNOSIS — Z7185 Encounter for immunization safety counseling: Secondary | ICD-10-CM | POA: Insufficient documentation

## 2024-01-09 NOTE — Progress Notes (Deleted)
 Subjective:  Chief complaint: follow-up for HIV disease on medications   Patient ID: Wayne Santos, male    DOB: 22-Apr-1951, 73 y.o.   MRN: [id number]  HPI  Past Medical History:  Diagnosis Date   Anemia    AVN (avascular necrosis of bone) (HCC)    Benign paroxysmal positional vertigo 06/15/2015   CAD (coronary artery disease), native coronary artery    a. 08/24/16 NSTEMI/PCI: LM nl, LAD 40p, D1 60ost, D2 80ost-small, LCX 72m (2.5x30 ONyx DES), OM1/2 min irregs, OM3 nl, RCA 30p/m, RPDA/RPAV/RPL1/RPL2/RPL3 nl, EF 55-65%.   Childhood asthma    Dizziness 05/20/2020   Dyspnea on exertion 03/06/2023   Former smoker 12/28/2020   GERD (gastroesophageal reflux disease)    Glaucoma    History of pneumonia    4 times in my life (08/24/2016)   Hyperlipidemia    Hypertension    Memory impairment 06/15/2015   Normochromic normocytic anemia 12/16/2019   Osteoporosis    Poison ivy 12/26/2018   Primary localized osteoarthrosis of left hip 07/05/2016   Seasonal allergies    Tobacco abuse    Type 2 diabetes mellitus without complications (HCC) 12/22/2021    Past Surgical History:  Procedure Laterality Date   CARDIAC CATHETERIZATION     CORONARY ANGIOPLASTY WITH STENT PLACEMENT  08/24/2016   CORONARY STENT INTERVENTION N/A 08/24/2016   Procedure: Coronary Stent Intervention;  Surgeon: Deatrice DELENA Cage, MD;  Location: [facility];  Service: Cardiovascular;  Laterality: N/A;   HAND NERVE REPAIR Right 1990s?   caught it in a machine; had to be reconstructed   JOINT REPLACEMENT     LEFT HEART CATH AND CORONARY ANGIOGRAPHY N/A 08/24/2016   Procedure: Left Heart Cath and Coronary Angiography;  Surgeon: Deatrice DELENA Cage, MD;  Location: [facility];  Service: Cardiovascular;  Laterality: N/A;   TOTAL HIP ARTHROPLASTY Right    TOTAL HIP ARTHROPLASTY Left 07/05/2016   Procedure: TOTAL HIP ARTHROPLASTY ANTERIOR APPROACH;  Surgeon: Maude Herald, MD;  Location: [facility];  Service:  Orthopedics;  Laterality: Left;    Family History  Problem Relation Age of Onset   Cancer Mother    Heart attack Father    Diabetes Sister    Diabetes Sister    Diabetes Brother    Hypertension Brother    Hypertension Brother       Social History   Socioeconomic History   Marital status: Single    Spouse name: Not on file   Number of children: Not on file   Years of education: Not on file   Highest education level: Not on file  Occupational History   Not on file  Tobacco Use   Smoking status: Former    Current packs/day: 0.00    Average packs/day: 0.5 packs/day for 47.0 years (23.5 ttl pk-yrs)    Types: Cigarettes    Start date: 09/06/1969    Quit date: 09/06/2016    Years since quitting: 7.3   Smokeless tobacco: Never  Vaping Use   Vaping status: Never Used  Substance and Sexual Activity   Alcohol use: Yes    Comment: social   Drug use: Not Currently    Types: Marijuana    Comment: 08/24/2016 nothing since my younger days   Sexual activity: Not Currently    Comment: declined condoms.   Other Topics Concern   Not on file  Social History Narrative   Not on file   Social Drivers of Health   Financial Resource  Strain: Not on file  Food Insecurity: Not on file  Transportation Needs: Not on file  Physical Activity: Not on file  Stress: Not on file  Social Connections: Not on file    Allergies  Allergen Reactions   Lisinopril Other (See Comments) and Cough    Caused a terrible, persistent cough   Ace Inhibitors Cough     Current Outpatient Medications:    ACCU-CHEK GUIDE test strip, 1 each by Other route 3 (three) times daily., Disp: , Rfl:    albuterol  (VENTOLIN  HFA) 108 (90 Base) MCG/ACT inhaler, Inhale 2 puffs into the lungs 4 (four) times daily as needed for wheezing or shortness of breath., Disp: , Rfl:    amLODipine  (NORVASC ) 5 MG tablet, Take 1 tablet (5 mg total) by mouth daily., Disp: 30 tablet, Rfl: 1   aspirin  EC 81 MG EC tablet, Take 1 tablet  (81 mg total) by mouth daily., Disp: , Rfl:    B-D UF III MINI PEN NEEDLES 31G X 5 MM MISC, Inject into the skin as directed., Disp: , Rfl:    bictegravir-emtricitabine -tenofovir  AF (BIKTARVY ) 50-200-25 MG TABS tablet, Take 1 tablet by mouth daily., Disp: 30 tablet, Rfl: 11   blood glucose meter kit and supplies KIT, Dispense based on patient and insurance preference. Use up to four times daily as directed. (FOR ICD-9 250.00, 250.01)., Disp: 1 each, Rfl: 0   cetirizine  (ZYRTEC ) 10 MG tablet, Take 10 mg by mouth daily as needed for allergies or rhinitis., Disp: , Rfl:    ciprofloxacin  (CILOXAN ) 0.3 % ophthalmic ointment, Apply a small half-inch ribbon to left eye 4 times a day for 5 days, Disp: 3.5 g, Rfl: 0   Insulin  Glargine (LANTUS ) 100 UNIT/ML Solostar Pen, Inject 10 Units into the skin daily at 10 pm. (Patient taking differently: Inject 20 Units into the skin at bedtime.), Disp: 15 mL, Rfl: 0   metFORMIN  (GLUCOPHAGE ) 1000 MG tablet, Take 1,000 mg by mouth 2 (two) times daily., Disp: , Rfl:    nitroGLYCERIN  (NITROSTAT ) 0.4 MG SL tablet, Place 1 tablet (0.4 mg total) under the tongue every 5 (five) minutes x 3 doses as needed for chest pain., Disp: 25 tablet, Rfl: 3   rosuvastatin  (CRESTOR ) 40 MG tablet, TAKE 1 TABLET(40 MG) BY MOUTH DAILY, Disp: 15 tablet, Rfl: 0   trimethoprim -polymyxin b  (POLYTRIM ) ophthalmic solution, Place 1 drop into the left eye 3 (three) times daily. For 7 days, Disp: 10 mL, Rfl: 0   Vitamin D , Ergocalciferol , (DRISDOL ) 1.25 MG (50000 UNIT) CAPS capsule, Take 50,000 Units by mouth every Monday., Disp: , Rfl:    Review of Systems     Objective:   Physical Exam        Assessment & Plan:

## 2024-01-10 ENCOUNTER — Ambulatory Visit: Payer: Medicare Other | Admitting: Infectious Disease

## 2024-01-10 DIAGNOSIS — E119 Type 2 diabetes mellitus without complications: Secondary | ICD-10-CM

## 2024-01-10 DIAGNOSIS — B20 Human immunodeficiency virus [HIV] disease: Secondary | ICD-10-CM

## 2024-01-10 DIAGNOSIS — E7800 Pure hypercholesterolemia, unspecified: Secondary | ICD-10-CM

## 2024-01-10 DIAGNOSIS — Z7185 Encounter for immunization safety counseling: Secondary | ICD-10-CM

## 2024-01-10 DIAGNOSIS — I10 Essential (primary) hypertension: Secondary | ICD-10-CM

## 2024-01-10 DIAGNOSIS — Z87891 Personal history of nicotine dependence: Secondary | ICD-10-CM

## 2024-01-10 DIAGNOSIS — I2510 Atherosclerotic heart disease of native coronary artery without angina pectoris: Secondary | ICD-10-CM

## 2024-01-10 DIAGNOSIS — Z113 Encounter for screening for infections with a predominantly sexual mode of transmission: Secondary | ICD-10-CM

## 2024-01-16 NOTE — Progress Notes (Unsigned)
 Chief complaint: follow-up for HIV disease on medications  Subjective:    Patient ID: Wayne Santos, male    DOB: Dec 21, 1950, 73 y.o.   MRN: [id number]  HPI  Discussed the use of AI scribe software for clinical note transcription with the patient, who gave verbal consent to proceed.  History of Present Illness   Wayne Santos is a 73 year old male with HIV who presents for follow-up of his HIV management and recent lab abnormalities.  He is currently on Biktarvy  for HIV management, having switched from Symtuza . Recent labs showed an undetectable HIV viral load and a CD4 count of 539.  He manages diabetes with insulin  and hypertension with amlodipine . His blood pressure remains variable despite medication, describing it as 'up and down now.'  He is on rosuvastatin  and asa for heart disease. He previously experienced difficulty breathing with exertion, which was attributed to postnasal drip and sinus issues after consulting an ear, nose, and throat specialist.  Recent labs indicated an elevated creatinine level of 1.55. He has been working outside in high temperatures. He has been trying to stay hydrated by drinking fluids.  He experiences feelings of depression, particularly related to political and social issues in current climate which is completely understandable.       Past Medical History:  Diagnosis Date   Anemia    AVN (avascular necrosis of bone) (HCC)    Benign paroxysmal positional vertigo 06/15/2015   CAD (coronary artery disease), native coronary artery    a. 08/24/16 NSTEMI/PCI: LM nl, LAD 40p, D1 60ost, D2 80ost-small, LCX 38m (2.5x30 ONyx DES), OM1/2 min irregs, OM3 nl, RCA 30p/m, RPDA/RPAV/RPL1/RPL2/RPL3 nl, EF 55-65%.   Childhood asthma    Dizziness 05/20/2020   Dyspnea on exertion 03/06/2023   Former smoker 12/28/2020   GERD (gastroesophageal reflux disease)    Glaucoma    History of pneumonia    4 times in my life (08/24/2016)   Hyperlipidemia     Hypertension    Memory impairment 06/15/2015   Normochromic normocytic anemia 12/16/2019   Osteoporosis    Poison ivy 12/26/2018   Primary localized osteoarthrosis of left hip 07/05/2016   Seasonal allergies    Tobacco abuse    Type 2 diabetes mellitus without complications (HCC) 12/22/2021   Vaccine counseling 01/09/2024    Past Surgical History:  Procedure Laterality Date   CARDIAC CATHETERIZATION     CORONARY ANGIOPLASTY WITH STENT PLACEMENT  08/24/2016   CORONARY STENT INTERVENTION N/A 08/24/2016   Procedure: Coronary Stent Intervention;  Surgeon: Deatrice DELENA Cage, MD;  Location: [facility];  Service: Cardiovascular;  Laterality: N/A;   HAND NERVE REPAIR Right 1990s?   caught it in a machine; had to be reconstructed   JOINT REPLACEMENT     LEFT HEART CATH AND CORONARY ANGIOGRAPHY N/A 08/24/2016   Procedure: Left Heart Cath and Coronary Angiography;  Surgeon: Deatrice DELENA Cage, MD;  Location: [facility];  Service: Cardiovascular;  Laterality: N/A;   TOTAL HIP ARTHROPLASTY Right    TOTAL HIP ARTHROPLASTY Left 07/05/2016   Procedure: TOTAL HIP ARTHROPLASTY ANTERIOR APPROACH;  Surgeon: Maude Herald, MD;  Location: [facility];  Service: Orthopedics;  Laterality: Left;    Family History  Problem Relation Age of Onset   Cancer Mother    Heart attack Father    Diabetes Sister    Diabetes Sister    Diabetes Brother    Hypertension Brother    Hypertension Brother  Social History   Socioeconomic History   Marital status: Single    Spouse name: Not on file   Number of children: Not on file   Years of education: Not on file   Highest education level: Not on file  Occupational History   Not on file  Tobacco Use   Smoking status: Former    Current packs/day: 0.00    Average packs/day: 0.5 packs/day for 47.0 years (23.5 ttl pk-yrs)    Types: Cigarettes    Start date: 09/06/1969    Quit date: 09/06/2016    Years since quitting: 7.3   Smokeless tobacco: Never   Vaping Use   Vaping status: Never Used  Substance and Sexual Activity   Alcohol use: Yes    Comment: social   Drug use: Not Currently    Types: Marijuana    Comment: 08/24/2016 nothing since my younger days   Sexual activity: Not Currently    Comment: declined condoms.   Other Topics Concern   Not on file  Social History Narrative   Not on file   Social Drivers of Health   Financial Resource Strain: Not on file  Food Insecurity: Not on file  Transportation Needs: Not on file  Physical Activity: Not on file  Stress: Not on file  Social Connections: Not on file    Allergies  Allergen Reactions   Lisinopril Other (See Comments) and Cough    Caused a terrible, persistent cough   Ace Inhibitors Cough     Current Outpatient Medications:    ACCU-CHEK GUIDE test strip, 1 each by Other route 3 (three) times daily., Disp: , Rfl:    albuterol  (VENTOLIN  HFA) 108 (90 Base) MCG/ACT inhaler, Inhale 2 puffs into the lungs 4 (four) times daily as needed for wheezing or shortness of breath., Disp: , Rfl:    amLODipine  (NORVASC ) 5 MG tablet, Take 1 tablet (5 mg total) by mouth daily., Disp: 30 tablet, Rfl: 1   aspirin  EC 81 MG EC tablet, Take 1 tablet (81 mg total) by mouth daily., Disp: , Rfl:    B-D UF III MINI PEN NEEDLES 31G X 5 MM MISC, Inject into the skin as directed., Disp: , Rfl:    bictegravir-emtricitabine -tenofovir  AF (BIKTARVY ) 50-200-25 MG TABS tablet, Take 1 tablet by mouth daily., Disp: 30 tablet, Rfl: 11   blood glucose meter kit and supplies KIT, Dispense based on patient and insurance preference. Use up to four times daily as directed. (FOR ICD-9 250.00, 250.01)., Disp: 1 each, Rfl: 0   cetirizine  (ZYRTEC ) 10 MG tablet, Take 10 mg by mouth daily as needed for allergies or rhinitis., Disp: , Rfl:    ciprofloxacin  (CILOXAN ) 0.3 % ophthalmic ointment, Apply a small half-inch ribbon to left eye 4 times a day for 5 days, Disp: 3.5 g, Rfl: 0   Insulin  Glargine (LANTUS ) 100  UNIT/ML Solostar Pen, Inject 10 Units into the skin daily at 10 pm. (Patient taking differently: Inject 20 Units into the skin at bedtime.), Disp: 15 mL, Rfl: 0   metFORMIN  (GLUCOPHAGE ) 1000 MG tablet, Take 1,000 mg by mouth 2 (two) times daily., Disp: , Rfl:    nitroGLYCERIN  (NITROSTAT ) 0.4 MG SL tablet, Place 1 tablet (0.4 mg total) under the tongue every 5 (five) minutes x 3 doses as needed for chest pain., Disp: 25 tablet, Rfl: 3   rosuvastatin  (CRESTOR ) 40 MG tablet, TAKE 1 TABLET(40 MG) BY MOUTH DAILY, Disp: 15 tablet, Rfl: 0   trimethoprim -polymyxin b  (POLYTRIM ) ophthalmic solution, Place  1 drop into the left eye 3 (three) times daily. For 7 days, Disp: 10 mL, Rfl: 0   Vitamin D , Ergocalciferol , (DRISDOL ) 1.25 MG (50000 UNIT) CAPS capsule, Take 50,000 Units by mouth every Monday., Disp: , Rfl:    Review of Systems  Constitutional:  Negative for activity change, appetite change, chills, diaphoresis, fatigue, fever and unexpected weight change.  HENT:  Negative for congestion, rhinorrhea, sinus pressure, sneezing, sore throat and trouble swallowing.   Eyes:  Negative for photophobia and visual disturbance.  Respiratory:  Negative for cough, chest tightness, shortness of breath, wheezing and stridor.   Cardiovascular:  Negative for chest pain, palpitations and leg swelling.  Gastrointestinal:  Negative for abdominal distention, abdominal pain, anal bleeding, blood in stool, constipation, diarrhea, nausea and vomiting.  Genitourinary:  Negative for difficulty urinating, dysuria, flank pain and hematuria.  Musculoskeletal:  Negative for arthralgias, back pain, gait problem, joint swelling and myalgias.  Skin:  Negative for color change, pallor, rash and wound.  Neurological:  Negative for dizziness, tremors, weakness and light-headedness.  Hematological:  Negative for adenopathy. Does not bruise/bleed easily.  Psychiatric/Behavioral:  Negative for agitation, behavioral problems, confusion,  decreased concentration, dysphoric mood and sleep disturbance.        Objective:   Physical Exam Constitutional:      Appearance: He is well-developed.  HENT:     Head: Normocephalic and atraumatic.  Eyes:     Conjunctiva/sclera: Conjunctivae normal.  Cardiovascular:     Rate and Rhythm: Normal rate and regular rhythm.  Pulmonary:     Effort: Pulmonary effort is normal. No respiratory distress.     Breath sounds: No wheezing.  Abdominal:     General: There is no distension.     Palpations: Abdomen is soft.  Musculoskeletal:        General: No tenderness. Normal range of motion.     Cervical back: Normal range of motion and neck supple.  Skin:    General: Skin is warm and dry.     Coloration: Skin is not pale.     Findings: No erythema or rash.  Neurological:     General: No focal deficit present.     Mental Status: He is alert and oriented to person, place, and time.  Psychiatric:        Mood and Affect: Mood normal.        Behavior: Behavior normal.        Thought Content: Thought content normal.        Judgment: Judgment normal.           Assessment & Plan:   Assessment and Plan    Human immunodeficiency virus (HIV) disease HIV is well-controlled with undetectable viral load and stable CD4 count of 539. - Continue Biktarvy  for HIV management. - Send Biktarvy  prescription to [facility] in [place].  Renal function monitoring for elevated creatinine Elevated creatinine at 1.55, possibly due to dehydration from outdoor work. No immediate concern for significant kidney damage. - Recheck creatinine levels today. - Perform urinalysis to assess kidney function. - Consider referral to primary care and nephrologist if creatinine remains elevated.  Essential hypertension Blood pressure not consistently controlled, risk of kidney damage over time. --he will continue amlodipine  and followup with Dr. Adella   Type 2 diabetes mellitus Managed with  insulin .  Atherosclerotic heart disease of native coronary artery Managed with rosuvastatin , ASA BP control  Pure hypercholesterolemia Managed with rosuvastatin .  General Health Maintenance Discussed COVID-19 vaccination options and availability. -  Consider COVID-19 vaccination with Moderna, prescription provided. - He prefers to receive vaccinations at local pharmacy when available. where he will also get his flu shot.

## 2024-01-17 ENCOUNTER — Ambulatory Visit

## 2024-01-17 ENCOUNTER — Other Ambulatory Visit: Payer: Self-pay

## 2024-01-17 ENCOUNTER — Ambulatory Visit (INDEPENDENT_AMBULATORY_CARE_PROVIDER_SITE_OTHER): Admitting: Infectious Disease

## 2024-01-17 ENCOUNTER — Telehealth: Payer: Self-pay

## 2024-01-17 VITALS — BP 162/81 | HR 70 | Temp 97.8°F | Wt 175.0 lb

## 2024-01-17 DIAGNOSIS — Z113 Encounter for screening for infections with a predominantly sexual mode of transmission: Secondary | ICD-10-CM

## 2024-01-17 DIAGNOSIS — I2510 Atherosclerotic heart disease of native coronary artery without angina pectoris: Secondary | ICD-10-CM

## 2024-01-17 DIAGNOSIS — Z794 Long term (current) use of insulin: Secondary | ICD-10-CM

## 2024-01-17 DIAGNOSIS — Z87891 Personal history of nicotine dependence: Secondary | ICD-10-CM | POA: Diagnosis not present

## 2024-01-17 DIAGNOSIS — B20 Human immunodeficiency virus [HIV] disease: Secondary | ICD-10-CM | POA: Diagnosis not present

## 2024-01-17 DIAGNOSIS — Z7185 Encounter for immunization safety counseling: Secondary | ICD-10-CM | POA: Diagnosis not present

## 2024-01-17 DIAGNOSIS — I10 Essential (primary) hypertension: Secondary | ICD-10-CM

## 2024-01-17 DIAGNOSIS — N289 Disorder of kidney and ureter, unspecified: Secondary | ICD-10-CM

## 2024-01-17 DIAGNOSIS — E119 Type 2 diabetes mellitus without complications: Secondary | ICD-10-CM | POA: Diagnosis not present

## 2024-01-17 DIAGNOSIS — E785 Hyperlipidemia, unspecified: Secondary | ICD-10-CM

## 2024-01-17 DIAGNOSIS — E7800 Pure hypercholesterolemia, unspecified: Secondary | ICD-10-CM

## 2024-01-17 MED ORDER — BIKTARVY 50-200-25 MG PO TABS: 1.0000 | ORAL_TABLET | Freq: Every day | ORAL | 11 refills | Status: AC

## 2024-01-17 MED ORDER — COVID-19 MRNA VACC (MODERNA) 50 MCG/0.5ML IM SUSY: 0.5000 mL | PREFILLED_SYRINGE | Freq: Once | INTRAMUSCULAR | 0 refills | Status: AC

## 2024-01-17 NOTE — Telephone Encounter (Signed)
 Patient walked into clinic today to inform Dr. Fleeta Rothman that PCP had started him on Vitamin d3. Asked if he could stop this; requested he follow up with PCP who is managing this.  Lorenda CHRISTELLA Code, RMA

## 2024-01-18 LAB — CREATININE, URINE, RANDOM: Creatinine, Urine: 128 mg/dL (ref 20–320)

## 2024-01-18 LAB — CREATININE, SERUM: Creat: 1.02 mg/dL (ref 0.70–1.28)

## 2024-01-18 LAB — SODIUM, URINE, RANDOM: Sodium, Ur: 35 mmol/L (ref 28–272)

## 2024-05-09 ENCOUNTER — Emergency Department (HOSPITAL_COMMUNITY)
Admission: EM | Admit: 2024-05-09 | Discharge: 2024-05-09 | Discharge status: Against Medical Advice | Attending: Emergency Medicine | Admitting: Emergency Medicine

## 2024-05-09 ENCOUNTER — Ambulatory Visit (HOSPITAL_COMMUNITY): Admission: EM | Admit: 2024-05-09 | Discharge: 2024-05-09 | Disposition: A | Discharge status: Home / Self Care

## 2024-05-09 ENCOUNTER — Encounter (HOSPITAL_COMMUNITY): Payer: Self-pay

## 2024-05-09 ENCOUNTER — Emergency Department (HOSPITAL_COMMUNITY)

## 2024-05-09 DIAGNOSIS — R079 Chest pain, unspecified: Secondary | ICD-10-CM | POA: Diagnosis not present

## 2024-05-09 DIAGNOSIS — R9431 Abnormal electrocardiogram [ECG] [EKG]: Secondary | ICD-10-CM

## 2024-05-09 DIAGNOSIS — Z5321 Procedure and treatment not carried out due to patient leaving prior to being seen by health care provider: Secondary | ICD-10-CM | POA: Insufficient documentation

## 2024-05-09 DIAGNOSIS — R6889 Other general symptoms and signs: Secondary | ICD-10-CM

## 2024-05-09 DIAGNOSIS — R197 Diarrhea, unspecified: Secondary | ICD-10-CM | POA: Insufficient documentation

## 2024-05-09 LAB — CBC WITH DIFFERENTIAL/PLATELET
Abs Immature Granulocytes: 0.01 K/uL (ref 0.00–0.07)
Basophils Absolute: 0 K/uL (ref 0.0–0.1)
Basophils Relative: 0 %
Eosinophils Absolute: 0.1 K/uL (ref 0.0–0.5)
Eosinophils Relative: 2 %
HCT: 35.3 % — ABNORMAL LOW (ref 39.0–52.0)
Hemoglobin: 10.9 g/dL — ABNORMAL LOW (ref 13.0–17.0)
Immature Granulocytes: 0 %
Lymphocytes Relative: 36 %
Lymphs Abs: 1.6 K/uL (ref 0.7–4.0)
MCH: 25.7 pg — ABNORMAL LOW (ref 26.0–34.0)
MCHC: 30.9 g/dL (ref 30.0–36.0)
MCV: 83.3 fL (ref 80.0–100.0)
Monocytes Absolute: 0.5 K/uL (ref 0.1–1.0)
Monocytes Relative: 12 %
Neutro Abs: 2.2 K/uL (ref 1.7–7.7)
Neutrophils Relative %: 50 %
Platelets: 233 K/uL (ref 150–400)
RBC: 4.24 MIL/uL (ref 4.22–5.81)
RDW: 14.8 % (ref 11.5–15.5)
WBC: 4.5 K/uL (ref 4.0–10.5)
nRBC: 0 % (ref 0.0–0.2)

## 2024-05-09 LAB — RESP PANEL BY RT-PCR (RSV, FLU A&B, COVID)  RVPGX2
Influenza A by PCR: NEGATIVE
Influenza B by PCR: NEGATIVE
Resp Syncytial Virus by PCR: NEGATIVE
SARS Coronavirus 2 by RT PCR: NEGATIVE

## 2024-05-09 LAB — BASIC METABOLIC PANEL WITH GFR
Anion gap: 12 (ref 5–15)
BUN: 13 mg/dL (ref 8–23)
CO2: 26 mmol/L (ref 22–32)
Calcium: 9.6 mg/dL (ref 8.9–10.3)
Chloride: 104 mmol/L (ref 98–111)
Creatinine, Ser: 1.02 mg/dL (ref 0.61–1.24)
GFR, Estimated: >60 mL/min
Glucose, Bld: 73 mg/dL (ref 70–99)
Potassium: 3.4 mmol/L — ABNORMAL LOW (ref 3.5–5.1)
Sodium: 142 mmol/L (ref 135–145)

## 2024-05-09 LAB — TROPONIN T, HIGH SENSITIVITY: Troponin T High Sensitivity: 23 ng/L — ABNORMAL HIGH (ref 0–19)

## 2024-05-09 NOTE — ED Notes (Signed)
Pt left due to wait time  

## 2024-05-09 NOTE — ED Notes (Signed)
Patient called x 2 for triage. No answer.

## 2024-05-09 NOTE — ED Triage Notes (Signed)
 Patient reports that he had a cough and flu symptoms that started 4 days ago and states no longer has. Patient states he began having diarrhea yesterday and mid chest pain today.  Patient has not had any medications for his symptoms.

## 2024-05-09 NOTE — ED Provider Notes (Signed)
 "    [facility]    CSN: [id number] Arrival date & time: 05/09/24  1322    HISTORY   Chief Complaint  Patient presents with   Fever   Diarrhea   Chest Pain   HPI Wayne Santos is a pleasant, 74 y.o. male who presents to urgent care today. Patient states he has had multiple episodes of diarrhea which began yesterday.  Patient states that early this morning and again around 11 AM, he had chest pain accompanied by shortness of breath.  Patient localizes the chest pain to the left side of his chest and states that it radiates down the left side of his abdomen.  Patient states has not tried medications to alleviate his symptoms.  Of note, patient states that 4 days ago he had a cough and flu-like symptoms but these have resolved.  EMR reviewed, patient is a type I diabetic, has multiple cardiac stents in situ and has had several episodes of pneumonia in the past.  EKG today reveals ST elevation in V1, V2 and V3 with some ST depression in V5 and V6.  Patient's blood pressure is mildly elevated on arrival with a slightly elevated temperature.  Patient is otherwise well-appearing.  The history is provided by the patient.  Fever Associated symptoms: chest pain and diarrhea   Diarrhea Associated symptoms: fever   Chest Pain Associated symptoms: fever    Past Medical History:  Diagnosis Date   Anemia    AVN (avascular necrosis of bone) (HCC)    Benign paroxysmal positional vertigo 06/15/2015   CAD (coronary artery disease), native coronary artery    a. 08/24/16 NSTEMI/PCI: LM nl, LAD 40p, D1 60ost, D2 80ost-small, LCX 33m (2.5x30 ONyx DES), OM1/2 min irregs, OM3 nl, RCA 30p/m, RPDA/RPAV/RPL1/RPL2/RPL3 nl, EF 55-65%.   Childhood asthma    Dizziness 05/20/2020   Dyspnea on exertion 03/06/2023   Former smoker 12/28/2020   GERD (gastroesophageal reflux disease)    Glaucoma    Heart attack (HCC)    History of pneumonia    4 times in my life (08/24/2016)   Hyperlipidemia     Hypertension    Memory impairment 06/15/2015   Normochromic normocytic anemia 12/16/2019   Osteoporosis    Poison ivy 12/26/2018   Primary localized osteoarthrosis of left hip 07/05/2016   Seasonal allergies    Tobacco abuse    Type 2 diabetes mellitus without complications (HCC) 12/22/2021   Vaccine counseling 01/09/2024   Patient Active Problem List   Diagnosis Date Noted   Vaccine counseling 01/09/2024   Dyspnea on exertion 03/06/2023   Type 2 diabetes mellitus without complications (HCC) 12/22/2021   Former smoker 12/28/2020   Dizziness 05/20/2020   Normochromic normocytic anemia 12/16/2019   Poison ivy 12/26/2018   CAD (coronary artery disease), native coronary artery 01/11/2017   NSTEMI (non-ST elevated myocardial infarction) (HCC) 08/24/2016   Primary localized osteoarthrosis of left hip 07/05/2016   Primary osteoarthritis of left hip 07/05/2016   Encounter for long-term (current) use of medications 05/18/2016   Routine screening for STI (sexually transmitted infection) 05/18/2016   Benign paroxysmal positional vertigo 06/15/2015   Memory impairment 06/15/2015   Benign essential HTN 06/02/2014   Type 1 diabetes mellitus with hyperglycemia (HCC) 02/04/2013   AVN (avascular necrosis of bone) (HCC)    Glaucoma    Hyperlipidemia 03/16/2011   ONYCHOMYCOSIS, TOENAILS 03/24/2010   MEMORY LOSS 12/21/2009   POSTOPERATIVE INFECTION 07/10/2008   Sinusitis, chronic 12/13/2007   URINARY TRACT INFECTION  SITE NOT SPECIFIED 04/04/2007   BOILS, RECURRENT 11/14/2006   HIV disease (HCC) 09/12/2006   PANIC ATTACK 09/12/2006   Essential hypertension 09/12/2006   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   caught it in a machine; had to be reconstructed   JOINT REPLACEMENT     LEFT HEART CATH AND CORONARY ANGIOGRAPHY N/A 08/24/2016   Procedure: Left Heart Cath and Coronary Angiography;  Surgeon: Deatrice DELENA Cage, MD;  [facility]: [facility];  Service: Cardiovascular;  Laterality: N/A;   TOTAL HIP ARTHROPLASTY Right    TOTAL HIP ARTHROPLASTY Left 07/05/2016   Procedure: TOTAL HIP ARTHROPLASTY ANTERIOR APPROACH;  Surgeon: Maude Herald, MD;  Location: [facility];  Service: Orthopedics;  Laterality: Left;    Home Medications    Prior to Admission medications  Medication Sig Start Date End Date Taking? Authorizing Provider  ACCU-CHEK GUIDE test strip 1 each by Other route 3 (three) times daily. 11/16/20   [provider]  albuterol (VENTOLIN HFA) 108 (90 Base) MCG/ACT inhaler Inhale 2 puffs into the lungs 4 (four) times daily as needed for wheezing or shortness of breath. 05/30/20   [provider]  amLODipine  (NORVASC ) 5 MG tablet Take 1 tablet (5 mg total) by mouth daily. 11/18/20   Fleeta Kathie Jomarie LOISE, MD  aspirin  EC 81 MG EC tablet Take 1 tablet (81 mg total) by mouth daily. 08/25/16   Henry Manuelita NOVAK, NP  B-D UF III MINI PEN NEEDLES 31G X 5 MM MISC Inject into the skin as directed. 09/25/20   [provider]  bictegravir-emtricitabine -tenofovir  AF (BIKTARVY ) 50-200-25 MG TABS tablet Take 1 tablet by mouth daily. 01/17/24   Fleeta Kathie Jomarie LOISE, MD  blood glucose meter kit and supplies KIT Dispense based on patient and insurance preference. Use up to four times daily as directed. (FOR ICD-9 250.00, 250.01). 07/27/17   Rolinda Rogue, MD  cetirizine  (ZYRTEC ) 10 MG tablet Take 10 mg by mouth daily as needed for allergies or rhinitis.    [provider]  Cholecalciferol  (VITAMIN D3) 50 MCG (2000 UT) capsule Take 2,000 Units by mouth daily. 12/11/23   [provider]   ciprofloxacin  (CILOXAN ) 0.3 % ophthalmic ointment Apply a small half-inch ribbon to left eye 4 times a day for 5 days 03/11/23   Blitch, Marval HERO, NP  Insulin  Glargine (LANTUS ) 100 UNIT/ML Solostar Pen Inject 10 Units into the skin daily at 10 pm. Patient taking differently: Inject 20 Units into the skin at bedtime. 07/27/17   Rolinda Rogue, MD  metFORMIN  (GLUCOPHAGE ) 1000 MG tablet Take 1,000 mg by mouth 2 (two) times daily. 06/25/19   [provider]  nitroGLYCERIN  (NITROSTAT ) 0.4 MG SL tablet Place 1 tablet (0.4 mg total) under the tongue every 5 (five) minutes x 3 doses as needed for chest pain. 03/10/21   Randol Simmonds, MD  rosuvastatin  (CRESTOR ) 40 MG tablet TAKE 1 TABLET(40 MG) BY MOUTH DAILY 09/01/21   Burnard Debby DELENA, MD  trimethoprim -polymyxin b  (POLYTRIM ) ophthalmic solution Place 1 drop into the left eye 3 (three) times daily. For 7 days 07/05/22   Rodriguez-Southworth, Sylvia, PA-C  Vitamin  D, Ergocalciferol , (DRISDOL ) 1.25 MG (50000 UNIT) CAPS capsule Take 50,000 Units by mouth every Monday. Patient not taking: Reported on 01/17/2024 12/20/19   [provider]    Family History Family History  Problem Relation Age of Onset   Cancer Mother    Heart attack Father    Diabetes Sister    Diabetes Sister    Diabetes Brother    Hypertension Brother    Hypertension Brother    Social History Social History[1] Allergies   Lisinopril and Ace inhibitors  Review of Systems Review of Systems  Constitutional:  Positive for fever.  Cardiovascular:  Positive for chest pain.  Gastrointestinal:  Positive for diarrhea.   Pertinent findings revealed after performing a 14 point review of systems has been noted in the history of present illness.  Physical Exam Vital Signs BP (!) 142/74 (BP Location: Left Arm)   Pulse 67   Temp 99 F (37.2 C) (Oral)   Resp 14   SpO2 94%   No data found.  Physical Exam Vitals and nursing note reviewed.  Constitutional:      General: He  is awake. He is not in acute distress.    Appearance: Normal appearance. He is well-developed and well-groomed. He is not ill-appearing.  HENT:     Head: Normocephalic and atraumatic.     Salivary Glands: Right salivary gland is not diffusely enlarged or tender. Left salivary gland is not diffusely enlarged or tender.     Right Ear: Hearing, tympanic membrane, ear canal and external ear normal.     Left Ear: Hearing, tympanic membrane, ear canal and external ear normal.     Nose: Nose normal.     Right Turbinates: Not enlarged, swollen or pale.     Left Turbinates: Not enlarged, swollen or pale.     Right Sinus: No maxillary sinus tenderness or frontal sinus tenderness.     Left Sinus: No maxillary sinus tenderness or frontal sinus tenderness.     Mouth/Throat:     Lips: Pink. No lesions.     Mouth: Mucous membranes are moist. No oral lesions.     Tongue: No lesions. Tongue does not deviate from midline.     Palate: No mass and lesions.     Pharynx: Oropharynx is clear. Uvula midline. No pharyngeal swelling, oropharyngeal exudate, posterior oropharyngeal erythema, uvula swelling or postnasal drip.     Tonsils: No tonsillar exudate. 0 on the right. 0 on the left.  Eyes:     General: Lids are normal.        Right eye: No discharge.        Left eye: No discharge.     Conjunctiva/sclera: Conjunctivae normal.     Right eye: Right conjunctiva is not injected.     Left eye: Left conjunctiva is not injected.  Neck:     Trachea: Trachea and phonation normal.  Cardiovascular:     Rate and Rhythm: Normal rate and regular rhythm.  Pulmonary:     Effort: Pulmonary effort is normal.     Breath sounds: Normal breath sounds.  Chest:     Chest wall: No tenderness.  Abdominal:     General: Bowel sounds are normal.     Palpations: Abdomen is soft.  Musculoskeletal:        General: Normal range of motion.     Cervical back: Full passive range of motion without pain, normal range of motion and  neck supple. Normal range of motion.  Lymphadenopathy:  Cervical: No cervical adenopathy.  Skin:    General: Skin is warm and dry.     Findings: No erythema or rash.  Neurological:     General: No focal deficit present.     Mental Status: He is alert and oriented to person, place, and time. Mental status is at baseline.  Psychiatric:        Attention and Perception: Attention and perception normal.        Mood and Affect: Mood and affect normal.        Speech: Speech normal.        Behavior: Behavior normal. Behavior is cooperative.        Thought Content: Thought content normal.     Visual Acuity Right Eye Distance:   Left Eye Distance:   Bilateral Distance:    Right Eye Near:   Left Eye Near:    Bilateral Near:     UC Couse / Diagnostics / Procedures:     Radiology No results found.  Procedures Procedures (including critical care time) EKG  Pending results:  Labs Reviewed - No data to display  Medications Ordered in UC: Medications - No data to display  UC Diagnoses / Final Clinical Impressions(s)   I have reviewed the triage vital signs and the nursing notes.  Pertinent labs & imaging results that were available during my care of the patient were reviewed by me and considered in my medical decision making (see chart for details).    Final diagnoses:  Feeling unwell  Abnormal EKG  Chest pain, unspecified type   Patient advised given his history of ischemic heart disease, he needs to be evaluated in the emergency room.  Patient was provided with a copy of his EKG and advised to go now.  Patient was agreeable to going to the emergency room via private vehicle for further evaluation.  Please see discharge instructions below for details of plan of care as provided to patient. ED Prescriptions   None    PDMP not reviewed this encounter.  Discharge Instructions   None     Disposition Upon Discharge:  Condition: stable for discharge home  Patient  presented with an acute illness with associated systemic symptoms and significant discomfort requiring urgent management. In my opinion, this is a condition that a prudent lay person (someone who possesses an average knowledge of health and medicine) may potentially expect to result in complications if not addressed urgently such as respiratory distress, impairment of bodily function or dysfunction of bodily organs.   Routine symptom specific, illness specific and/or disease specific instructions were discussed with the patient and/or caregiver at length.   As such, the patient has been evaluated and assessed, work-up was performed and treatment was provided in alignment with urgent care protocols and evidence based medicine.  Patient/parent/caregiver has been advised that the patient may require follow up for further testing and treatment if the symptoms continue in spite of treatment, as clinically indicated and appropriate.  Patient/parent/caregiver has been advised to return to the [facility] or PCP if no better; to PCP or the Emergency Department if new signs and symptoms develop, or if the current signs or symptoms continue to change or worsen for further workup, evaluation and treatment as clinically indicated and appropriate  The patient will follow up with their current PCP if and as advised. If the patient does not currently have a PCP we will assist them in obtaining one.   The patient may need specialty follow up if  the symptoms continue, in spite of conservative treatment and management, for further workup, evaluation, consultation and treatment as clinically indicated and appropriate.  Patient/parent/caregiver verbalized understanding and agreement of plan as discussed.  All questions were addressed during visit.  Please see discharge instructions below for further details of plan.  This office note has been dictated using Teaching laboratory technician.  Unfortunately, this method of  dictation can sometimes lead to typographical or grammatical errors.  I apologize for your inconvenience in advance if this occurs.  Please do not hesitate to reach out to me if clarification is needed.       [1]  Social History Tobacco Use   Smoking status: Former    Current packs/day: 0.00    Average packs/day: 0.5 packs/day for 47.0 years (23.5 ttl pk-yrs)    Types: Cigarettes    Start date: 09/06/1969    Quit date: 09/06/2016    Years since quitting: 7.6   Smokeless tobacco: Never  Vaping Use   Vaping status: Never Used  Substance Use Topics   Alcohol use: Yes    Comment: social   Drug use: Not Currently    Types: Marijuana    Comment: 08/24/2016 nothing since my younger days     Joesph Shaver Scales, PA-C 05/09/24 1601  "

## 2024-05-09 NOTE — ED Notes (Signed)
Called pt from lobby no answer X 1 

## 2024-05-09 NOTE — ED Notes (Addendum)
 SABRA

## 2024-05-09 NOTE — ED Notes (Signed)
 Patient is being discharged from the Urgent Care and sent to the Emergency Department via POV . Per Manuelita Search, PA, patient is in need of higher level of care due to chest and history of MI. Patient is aware and verbalizes understanding of plan of care.  Vitals:   05/09/24 1529  BP: (!) 142/74  Pulse: 67  Resp: 14  Temp: 99 F (37.2 C)  SpO2: 94%

## 2024-05-09 NOTE — ED Provider Triage Note (Signed)
 Emergency Medicine Provider Triage Evaluation Note  Wayne Santos , a 74 y.o. male  was evaluated in triage.  Pt complains of chest pain and diarrhea that started yesterday.  Patient was seen at urgent care for evaluation of this, when he was advised to come to the emergency department for further evaluation due to an abnormal EKG.  The note from urgent care says there were ST elevations in multiple leads and that the patient should seek evaluation.  Patient is currently denying any chest pain.  He does states that he is still having episodes of diarrhea.  He denies any shortness of breath.  Review of Systems  Positive: Chest pain, diarrhea Negative:   Physical Exam  BP (!) 176/79 (BP Location: Right Arm)   Pulse 75   Temp 98 F (36.7 C)   Resp 18   SpO2 98%  Gen:   Awake, no distress   Resp:  Normal effort, lung sounds clear bilaterally MSK:   Moves extremities without difficulty  Other:    Medical Decision Making  Medically screening exam initiated at 4:13 PM.  Appropriate orders placed.  AKIVA BRASSFIELD was informed that the remainder of the evaluation will be completed by another provider, this initial triage assessment does not replace that evaluation, and the importance of remaining in the ED until their evaluation is complete.  Labs, EKG, chest x-ray ordered.   Torrence Marry RAMAN, [place] 05/09/24 [phone]

## 2024-05-09 NOTE — ED Triage Notes (Addendum)
 Pt to ED from UC c/o abnormal EKG, pt initially went to UC today for flu like symptoms x 4 days. Pt currently denies CP and SHOB . Cardiac hx

## 2024-05-09 NOTE — ED Notes (Signed)
 Patient called x 3 from the lobby. No answer,

## 2024-06-05 ENCOUNTER — Telehealth: Payer: Self-pay | Admitting: Emergency Medicine

## 2024-06-05 DIAGNOSIS — Z87891 Personal history of nicotine dependence: Secondary | ICD-10-CM

## 2024-06-05 DIAGNOSIS — Z122 Encounter for screening for malignant neoplasm of respiratory organs: Secondary | ICD-10-CM

## 2024-06-05 NOTE — Telephone Encounter (Signed)
 Copied from CRM #[id number]. Topic: Appointments - Scheduling Inquiry for Clinic >> Jun 04, 2024  3:56 PM [name] wrote: Reason for CRM: Patient states he missed a call to schedule his LCS per the referral in his chart and is requesting a call back around 10 am tomorrow 1/28.   Lung Cancer Screening Narrative/Criteria Questionnaire (Cigarette Smokers Only- No Cigars/Pipes/vapes)   Wayne Santos   SDMV:06/11/2024 9:00 [name]        November 07, 1950   LDCT: 06/12/2024 10:40 GI    74 y.o.   Phone: [phone]  Lung Screening Narrative (confirm age 35-77 yrs Medicare / 50-80 yrs Private pay insurance)   Insurance information:[employer] mcr    Referring Provider:Dr. Ricky   This screening involves an initial phone call with a team member from our program. It is called a shared decision making visit. The initial meeting is required by  insurance and Medicare to make sure you understand the program. This appointment takes about 15-20 minutes to complete. You will complete the screening scan at your scheduled date/time.  This scan takes about 5-10 minutes to complete. You can eat and drink normally before and after the scan.  Criteria questions for Lung Cancer Screening:   Are you a current or former smoker? Former Age began smoking: 74yo   If you are a former smoker, what year did you quit smoking? 2021(within 15 yrs)   To calculate your smoking history, I need an accurate estimate of how many packs of cigarettes you smoked per day and for how many years. (Not just the number of PPD you are now smoking)   Years smoking 54 x Packs per day 2 = Pack years 108   (at least 20 pack yrs)   (Make sure they understand that we need to know how much they have smoked in the past, not just the number of PPD they are smoking now)  Do you have a personal history of cancer?  No    Do you have a family history of cancer? Yes  (cancer type and and relative) Mother - breast, sister - ovarian  Are you coughing up  blood?  No  Have you had unexplained weight loss of 15 lbs or more in the last 6 months? No  It looks like you meet all criteria.  When would be a good time for us  to schedule you for this screening?   Additional information: N/A

## 2024-06-11 ENCOUNTER — Encounter: Payer: Self-pay | Admitting: *Deleted

## 2024-06-11 ENCOUNTER — Ambulatory Visit: Admitting: *Deleted

## 2024-06-11 DIAGNOSIS — Z87891 Personal history of nicotine dependence: Secondary | ICD-10-CM

## 2024-06-11 NOTE — Progress Notes (Signed)
 Virtual Visit via Telephone Note  I connected with Wayne Santos on 06/11/24 at  9:00 AM EST by telephone and verified that I am speaking with the correct person using two identifiers.  Location: Patient: at home Provider: [address], [place], [place], [address]    I discussed the limitations, risks, security and privacy concerns of performing an evaluation and management service by telephone and the availability of in person appointments. I also discussed with the patient that there may be a patient responsible charge related to this service. The patient expressed understanding and agreed to proceed.    Shared Decision Making Visit Lung Cancer Screening Program [phone])   Eligibility: Age 26 y.o. Pack Years Smoking History Calculation 102 (# packs/per year x # years smoked) Recent History of coughing up blood  no Unexplained weight loss? no ( >Than 15 pounds within the last 6 months ) Prior History Lung / other cancer no (Diagnosis within the last 5 years already requiring surveillance chest CT Scans). Smoking Status Former Smoker Former Smokers: Years since quit: 6 years  Quit Date: 2020  Visit Components: Discussion included one or more decision making aids. yes Discussion included risk/benefits of screening. yes Discussion included potential follow up diagnostic testing for abnormal scans. yes Discussion included meaning and risk of over diagnosis. yes Discussion included meaning and risk of False Positives. yes Discussion included meaning of total radiation exposure. yes  Counseling Included: Importance of adherence to annual lung cancer LDCT screening. yes Impact of comorbidities on ability to participate in the program. yes Ability and willingness to under diagnostic treatment. yes  Smoking Cessation Counseling: Current Smokers:  Discussed importance of smoking cessation. yes Information about tobacco cessation classes and interventions provided to  patient. yes Patient provided with ticket for LDCT Scan. no Symptomatic Patient. no  Counseling(Intermediate counseling: > three minutes) [id number] Diagnosis Code: Tobacco Use Z72.0 Asymptomatic Patient yes  Counseling (Intermediate counseling: > three minutes counseling) [id number] Former Smokers:  Discussed the importance of maintaining cigarette abstinence. yes Diagnosis Code: Personal History of Nicotine  Dependence. S12.108 Information about tobacco cessation classes and interventions provided to patient. Yes Patient provided with ticket for LDCT Scan. no Written Order for Lung Cancer Screening with LDCT placed in Epic. Yes (CT Chest Lung Cancer Screening Low Dose W/O CM) [id number] Z12.2-Screening of respiratory organs Z87.891-Personal history of nicotine  dependence   Laneta Speaks, RN

## 2024-06-11 NOTE — Patient Instructions (Signed)

## 2024-06-12 ENCOUNTER — Ambulatory Visit
Admission: RE | Admit: 2024-06-12 | Discharge: 2024-06-12 | Disposition: A | Source: Ambulatory Visit | Attending: Acute Care | Admitting: Acute Care

## 2024-06-12 DIAGNOSIS — Z122 Encounter for screening for malignant neoplasm of respiratory organs: Secondary | ICD-10-CM

## 2024-06-12 DIAGNOSIS — Z87891 Personal history of nicotine dependence: Secondary | ICD-10-CM

## 2024-11-14 ENCOUNTER — Other Ambulatory Visit

## 2024-11-28 ENCOUNTER — Encounter: Payer: Self-pay | Admitting: Internal Medicine
